# Patient Record
Sex: Female | Born: 1960 | Race: White | Hispanic: No | Marital: Married | Smoking: Current every day smoker
Health system: Southern US, Community
[De-identification: ages and names within clinical notes are randomized; demographics above are authoritative.]

## PROBLEM LIST (undated history)

## (undated) DIAGNOSIS — F4310 Post-traumatic stress disorder, unspecified: Secondary | ICD-10-CM

## (undated) DIAGNOSIS — I10 Essential (primary) hypertension: Secondary | ICD-10-CM

## (undated) DIAGNOSIS — J449 Chronic obstructive pulmonary disease, unspecified: Secondary | ICD-10-CM

---

## 1984-04-11 HISTORY — PX: TOTAL ABDOMINAL HYSTERECTOMY: SHX209

## 1984-04-11 HISTORY — PX: ABDOMINAL HYSTERECTOMY: SHX81

## 2008-06-09 ENCOUNTER — Emergency Department: Payer: Self-pay | Admitting: Emergency Medicine

## 2010-03-07 ENCOUNTER — Inpatient Hospital Stay: Payer: Self-pay | Admitting: Internal Medicine

## 2010-08-05 ENCOUNTER — Ambulatory Visit: Payer: Self-pay | Admitting: Family Medicine

## 2010-08-31 ENCOUNTER — Ambulatory Visit: Payer: Self-pay | Admitting: Internal Medicine

## 2011-10-15 DIAGNOSIS — A419 Sepsis, unspecified organism: Secondary | ICD-10-CM | POA: Insufficient documentation

## 2011-10-15 DIAGNOSIS — R197 Diarrhea, unspecified: Secondary | ICD-10-CM | POA: Insufficient documentation

## 2011-11-02 DIAGNOSIS — R109 Unspecified abdominal pain: Secondary | ICD-10-CM | POA: Insufficient documentation

## 2012-01-20 ENCOUNTER — Ambulatory Visit: Payer: Self-pay | Admitting: Internal Medicine

## 2013-07-18 LAB — TSH: TSH: 1.78 u[IU]/mL (ref <=5.90)

## 2013-07-18 LAB — BASIC METABOLIC PANEL
BUN: 8 mg/dL (ref 4–21)
CREATININE: 0.6 mg/dL (ref <=1.1)

## 2013-07-18 LAB — LIPID PANEL
Cholesterol: 184 mg/dL (ref 0–200)
HDL: 117 mg/dL — AB (ref 35–70)
LDL Cholesterol: 58 mg/dL
Triglycerides: 46 mg/dL (ref 40–160)

## 2013-11-11 ENCOUNTER — Ambulatory Visit: Payer: Self-pay | Admitting: Physician Assistant

## 2014-07-18 ENCOUNTER — Ambulatory Visit
Admit: 2014-07-18 | Disposition: A | Discharge status: Home / Self Care | Payer: Self-pay | Attending: Internal Medicine | Admitting: Internal Medicine

## 2014-09-15 ENCOUNTER — Other Ambulatory Visit: Payer: Self-pay | Admitting: Internal Medicine

## 2014-09-15 ENCOUNTER — Telehealth: Payer: Self-pay

## 2014-09-15 DIAGNOSIS — F419 Anxiety disorder, unspecified: Secondary | ICD-10-CM | POA: Insufficient documentation

## 2014-09-15 DIAGNOSIS — M19079 Primary osteoarthritis, unspecified ankle and foot: Secondary | ICD-10-CM | POA: Insufficient documentation

## 2014-09-15 DIAGNOSIS — F1020 Alcohol dependence, uncomplicated: Secondary | ICD-10-CM | POA: Insufficient documentation

## 2014-09-15 DIAGNOSIS — I340 Nonrheumatic mitral (valve) insufficiency: Secondary | ICD-10-CM | POA: Insufficient documentation

## 2014-09-15 DIAGNOSIS — R05 Cough: Secondary | ICD-10-CM | POA: Insufficient documentation

## 2014-09-15 DIAGNOSIS — F32A Depression, unspecified: Secondary | ICD-10-CM | POA: Insufficient documentation

## 2014-09-15 DIAGNOSIS — N951 Menopausal and female climacteric states: Secondary | ICD-10-CM | POA: Insufficient documentation

## 2014-09-15 DIAGNOSIS — R053 Chronic cough: Secondary | ICD-10-CM | POA: Insufficient documentation

## 2014-09-15 DIAGNOSIS — J432 Centrilobular emphysema: Secondary | ICD-10-CM | POA: Insufficient documentation

## 2014-09-15 DIAGNOSIS — F17200 Nicotine dependence, unspecified, uncomplicated: Secondary | ICD-10-CM | POA: Insufficient documentation

## 2014-09-15 DIAGNOSIS — I10 Essential (primary) hypertension: Secondary | ICD-10-CM | POA: Insufficient documentation

## 2014-09-15 DIAGNOSIS — R636 Underweight: Secondary | ICD-10-CM | POA: Insufficient documentation

## 2014-09-15 HISTORY — DX: Primary osteoarthritis, unspecified ankle and foot: M19.079

## 2014-09-15 MED ORDER — FLUTICASONE PROPIONATE 50 MCG/ACT NA SUSP: 2.0000 | Freq: Every day | NASAL | Status: DC

## 2014-09-15 MED ORDER — DILTIAZEM HCL ER COATED BEADS 240 MG PO TB24: 240.0000 mg | ORAL_TABLET | Freq: Every day | ORAL | Status: DC

## 2014-09-15 NOTE — Telephone Encounter (Signed)
Patient needs refill on Cardizem and Flonase

## 2014-09-15 NOTE — Telephone Encounter (Signed)
Refill sent to pharmacy as requested

## 2014-10-02 ENCOUNTER — Ambulatory Visit: Payer: Medicaid Other

## 2014-10-02 ENCOUNTER — Ambulatory Visit
Admission: EM | Admit: 2014-10-02 | Discharge: 2014-10-02 | Disposition: A | Discharge status: Home / Self Care | Payer: Medicaid Other | Attending: Internal Medicine | Admitting: Internal Medicine

## 2014-10-02 DIAGNOSIS — S8002XA Contusion of left knee, initial encounter: Secondary | ICD-10-CM | POA: Diagnosis not present

## 2014-10-02 DIAGNOSIS — Z79899 Other long term (current) drug therapy: Secondary | ICD-10-CM | POA: Diagnosis not present

## 2014-10-02 DIAGNOSIS — M25562 Pain in left knee: Secondary | ICD-10-CM | POA: Diagnosis present

## 2014-10-02 DIAGNOSIS — F4310 Post-traumatic stress disorder, unspecified: Secondary | ICD-10-CM | POA: Diagnosis not present

## 2014-10-02 DIAGNOSIS — F17210 Nicotine dependence, cigarettes, uncomplicated: Secondary | ICD-10-CM | POA: Diagnosis not present

## 2014-10-02 DIAGNOSIS — W19XXXA Unspecified fall, initial encounter: Secondary | ICD-10-CM | POA: Diagnosis not present

## 2014-10-02 DIAGNOSIS — I10 Essential (primary) hypertension: Secondary | ICD-10-CM | POA: Insufficient documentation

## 2014-10-02 DIAGNOSIS — J449 Chronic obstructive pulmonary disease, unspecified: Secondary | ICD-10-CM | POA: Insufficient documentation

## 2014-10-02 HISTORY — DX: Essential (primary) hypertension: I10

## 2014-10-02 HISTORY — DX: Chronic obstructive pulmonary disease, unspecified: J44.9

## 2014-10-02 HISTORY — DX: Post-traumatic stress disorder, unspecified: F43.10

## 2014-10-02 MED ORDER — KETOROLAC TROMETHAMINE 60 MG/2ML IM SOLN
60.0000 mg | Freq: Once | INTRAMUSCULAR | Status: AC
  Administered 2014-10-02: 60 mg via INTRAMUSCULAR

## 2014-10-02 MED ORDER — HYDROCODONE-ACETAMINOPHEN 5-325 MG PO TABS: 1.0000 | ORAL_TABLET | ORAL | Status: DC | PRN

## 2014-10-02 NOTE — ED Notes (Signed)
States tripped last night and fell landing on left knee on floor. Denies LOC. States left leg/knee painful to bear weight

## 2014-10-02 NOTE — Discharge Instructions (Signed)
Xrays at the urgent care today did not show evidence of fracture. Ice for 5-10 minutes several times over the next couple days will help decrease swelling and pain. Prescription for pain medicine is attached.  Aleve or advil may also be helpful with pain. Anticipate slow improvement in pain/swelling over the next 2-3 weeks.  Contusion A contusion is a deep bruise. Contusions are the result of an injury that caused bleeding under the skin. The contusion may turn blue, purple, or yellow. Minor injuries will give you a painless contusion, but more severe contusions may stay painful and swollen for a few weeks.  CAUSES  A contusion is usually caused by a blow, trauma, or direct force to an area of the body. SYMPTOMS   Swelling and redness of the injured area.  Bruising of the injured area.  Tenderness and soreness of the injured area.  Pain. DIAGNOSIS  The diagnosis can be made by taking a history and physical exam. An X-ray, CT scan, or MRI may be needed to determine if there were any associated injuries, such as fractures. TREATMENT  Specific treatment will depend on what area of the body was injured. In general, the best treatment for a contusion is resting, icing, elevating, and applying cold compresses to the injured area. Over-the-counter medicines may also be recommended for pain control. Ask your caregiver what the best treatment is for your contusion. HOME CARE INSTRUCTIONS   Put ice on the injured area.  Put ice in a plastic bag.  Place a towel between your skin and the bag.  Leave the ice on for 15-20 minutes, 3-4 times a day, or as directed by your health care provider.  Only take over-the-counter or prescription medicines for pain, discomfort, or fever as directed by your caregiver. Your caregiver may recommend avoiding anti-inflammatory medicines (aspirin, ibuprofen, and naproxen) for 48 hours because these medicines may increase bruising.  Rest the injured area.  If  possible, elevate the injured area to reduce swelling. SEEK IMMEDIATE MEDICAL CARE IF:   You have increased bruising or swelling.  You have pain that is getting worse.  Your swelling or pain is not relieved with medicines. MAKE SURE YOU:   Understand these instructions.  Will watch your condition.  Will get help right away if you are not doing well or get worse. Document Released: 01/05/2005 Document Revised: 04/02/2013 Document Reviewed: 01/31/2011 [facility] Patient Information 2015 [place], [place]. This information is not intended to replace advice given to you by your health care provider. Make sure you discuss any questions you have with your health care provider.

## 2014-10-02 NOTE — ED Provider Notes (Signed)
CSN: [id number]     Arrival date & time 10/02/14  1457 History   First MD Initiated Contact with Patient 10/02/14 1529     Chief Complaint  Patient presents with  . Knee Pain   HPI   Patient is a 54 year old lady who reports that she fell last night, fell forward, landing on the left knee, not real clear on details regarding the fall but just got a new kitten and admits to 6 or 7 alcoholic beverages daily. She was able to walk into the urgent care independently today, having a lot of discomfort in the medial left knee. She has some minor contusions/abrasions around the left elbow, but moving the left elbow freely. No other injuries reported. Has extensive history of lung disease, COPD, and prednisone therapy.  Past Medical History  Diagnosis Date  . COPD (chronic obstructive pulmonary disease)   . Hypertension   . PTSD (post-traumatic stress disorder)    Past Surgical History  Procedure Laterality Date  . Abdominal hysterectomy  1986   Family History  Problem Relation Age of Onset  . Cancer Mother     breast   History  Substance Use Topics  . Smoking status: Current Every Day Smoker -- 1.00 packs/day    Types: Cigarettes  . Smokeless tobacco: Not on file  . Alcohol Use: 0.0 oz/week    0 Standard drinks or equivalent per week     Comment: 6-7 each day   OB History    No data available     Review of Systems  All other systems reviewed and are negative.   Allergies  Azithromycin  Home Medications   Prior to Admission medications   Medication Sig Start Date End Date Taking? Authorizing Provider  albuterol (PROVENTIL HFA;VENTOLIN HFA) 108 (90 BASE) MCG/ACT inhaler Inhale 2 Inhalers into the lungs 4 (four) times daily as needed. 05/13/14  Yes Historical Provider, MD  ALPRAZolam Duanne Moron) 0.5 MG tablet Take 1 tablet by mouth daily.   Yes Historical Provider, MD  benazepril (LOTENSIN) 5 MG tablet Take 1 tablet by mouth daily. 05/13/14  Yes Historical Provider, MD  CARDIZEM LA  240 MG 24 hr tablet TAKE 1 TABLET BY MOUTH EVERY DAY 09/15/14  Yes Glean Hess, MD  fluticasone [facility]) 50 MCG/ACT nasal spray SPRAY TWICE IN EACH NOSTRIL EVERY DAY 09/15/14   Glean Hess, MD  Fluticasone-Salmeterol (ADVAIR) 100-50 MCG/DOSE AEPB Inhale 1 Inhaler into the lungs 2 (two) times daily. 04/15/13   Historical Provider, MD   BP 118/67 mmHg  Pulse 102  Temp(Src) 98.2 F (36.8 C) (Tympanic)  Resp 18  Ht '5\' 4"'$  (1.626 m)  Wt 85 lb (38.556 kg)  BMI 14.58 kg/m2  SpO2 99% Physical Exam  Constitutional: She is oriented to person, place, and time. No distress.  Alert, nicely groomed Smell strongly of smoke. Patient is noted to be markedly thin.  HENT:  Head: Atraumatic.  Eyes:  Conjugate gaze, no eye redness/drainage  Neck: Neck supple.  Cardiovascular: Normal rate.   Pulmonary/Chest: No respiratory distress.  Abdominal: She exhibits no distension.  Musculoskeletal: Normal range of motion.  No leg swelling Probable moderate left knee effusion, with limitation of knee flexion to about 90 because of pain. There is focal bony tenderness at the medial aspect of the knee, inferiorly, not much in the way of bruising.  Neurological: She is alert and oriented to person, place, and time.  Skin: Skin is warm and dry.  No cyanosis Skin is tanned  Nursing note and vitals reviewed.   ED Course  Procedures   Toradol injection 60 mg given at the urgent care for pain.  Imaging Review Dg Knee Ap/lat W/sunrise Left  10/02/2014   CLINICAL DATA:  Status post fall 10/01/2014. Left knee pain. Initial encounter.  EXAM: LEFT KNEE 3 VIEWS  COMPARISON:  None.  FINDINGS: There is no evidence of fracture, dislocation, or joint effusion. There is no evidence of arthropathy or other focal bone abnormality. Soft tissues are unremarkable.  IMPRESSION: Negative exam.   Electronically Signed   By: Inge Rise M.D.   On: 10/02/2014 16:11     MDM   1. Contusion, knee, left, initial encounter     Ice for 5 or 10 minutes several times over the next several days they be helpful in reducing pain and swelling. Advil or Aleve over-the-counter may also help with pain. A prescription for #6 of Vicodin tablets was given to help with pain in the very short-term. Recheck or follow up with PCP/Dr. Army Melia in a week or so if not slowly improving. Anticipate gradual improvement over the next 2 or 3 weeks.    Sherlene Shams, MD 10/02/14 [id number]

## 2014-11-10 ENCOUNTER — Other Ambulatory Visit: Payer: Self-pay | Admitting: Internal Medicine

## 2014-12-11 ENCOUNTER — Other Ambulatory Visit: Payer: Self-pay | Admitting: Internal Medicine

## 2014-12-13 ENCOUNTER — Encounter: Payer: Self-pay | Admitting: Gynecology

## 2014-12-13 ENCOUNTER — Ambulatory Visit
Admission: EM | Admit: 2014-12-13 | Discharge: 2014-12-13 | Disposition: A | Discharge status: Home / Self Care | Payer: Medicaid Other | Attending: Internal Medicine | Admitting: Internal Medicine

## 2014-12-13 DIAGNOSIS — J441 Chronic obstructive pulmonary disease with (acute) exacerbation: Secondary | ICD-10-CM

## 2014-12-13 MED ORDER — IPRATROPIUM-ALBUTEROL 0.5-2.5 (3) MG/3ML IN SOLN
3.0000 mL | Freq: Once | RESPIRATORY_TRACT | Status: AC
Start: 2014-12-13 — End: 2014-12-13
  Administered 2014-12-13: 3 mL via RESPIRATORY_TRACT

## 2014-12-13 MED ORDER — BENZONATATE 200 MG PO CAPS: 200.0000 mg | ORAL_CAPSULE | Freq: Three times a day (TID) | ORAL | Status: DC | PRN

## 2014-12-13 MED ORDER — IPRATROPIUM BROMIDE HFA 17 MCG/ACT IN AERS: 2.0000 | INHALATION_SPRAY | RESPIRATORY_TRACT | Status: DC | PRN

## 2014-12-13 MED ORDER — PREDNISONE 50 MG PO TABS: 50.0000 mg | ORAL_TABLET | Freq: Every day | ORAL | Status: DC

## 2014-12-13 NOTE — ED Notes (Signed)
Patient states sob, cough, congestion, diarrhea times 3 days

## 2014-12-13 NOTE — Discharge Instructions (Signed)
Prescriptions for prednisone, tessalon (benzonatate, a cough preparation), and atrovent (ipratropium inhaler, like breathing treatment given at urgent care today) were sent to the pharmacy. Recheck for worsening cough, phlegm production, fever, increasing breathlessness. Anticipate gradual improvement in the cough over the next 2-3 weeks.

## 2014-12-13 NOTE — ED Provider Notes (Signed)
CSN: [id number]     Arrival date & time 12/13/14  1357 History   First MD Initiated Contact with Patient 12/13/14 1442     Chief Complaint  Patient presents with  . Nasal Congestion  . Shortness of Breath  . Cough  . Diarrhea   HPI  Patient is a 54 year old lady with COPD who presents with a 2 day history of increasing cough, phlegm production, dyspnea. Tactile temperature. Not resting at night because of coughing. Continues to smoke. Little bit of diarrhea, no vomiting.  Past Medical History  Diagnosis Date  . COPD (chronic obstructive pulmonary disease)   . Hypertension   . PTSD (post-traumatic stress disorder)    Past Surgical History  Procedure Laterality Date  . Abdominal hysterectomy  1986   Family History  Problem Relation Age of Onset  . Cancer Mother     breast   Social History  Substance Use Topics  . Smoking status: Current Every Day Smoker -- 1.00 packs/day    Types: Cigarettes  . Smokeless tobacco: Never Used  . Alcohol Use: 0.0 oz/week    0 Standard drinks or equivalent per week     Comment: 6-7 each day    Review of Systems  All other systems reviewed and are negative.   Allergies  Azithromycin  Home Medications   Prior to Admission medications   Medication Sig Start Date End Date Taking? Authorizing Provider  ALPRAZolam Duanne Moron) 0.5 MG tablet Take 1 tablet by mouth daily.   Yes Historical Provider, MD  benazepril (LOTENSIN) 5 MG tablet TAKE 1 TABLET BY MOUTH EVERY DAY 12/11/14  Yes Glean Hess, MD  CARDIZEM LA 240 MG 24 hr tablet TAKE 1 TABLET BY MOUTH EVERY DAY 09/15/14  Yes Glean Hess, MD  fluticasone [facility]) 50 MCG/ACT nasal spray SPRAY TWICE IN EACH NOSTRIL EVERY DAY 09/15/14  Yes Glean Hess, MD  Fluticasone-Salmeterol (ADVAIR) 100-50 MCG/DOSE AEPB Inhale 1 Inhaler into the lungs 2 (two) times daily. 04/15/13  Yes Historical Provider, MD  PROVENTIL HFA 108 (90 BASE) MCG/ACT inhaler INHALE 2 PUFFS BY MOUTH FOUR TIMES DAILY 11/11/14  Yes  Glean Hess, MD                        Meds Ordered and Administered this Visit   Medications  ipratropium-albuterol (DUONEB) 0.5-2.5 (3) MG/3ML nebulizer solution 3 mL (3 mLs Nebulization Given 12/13/14 1524)   mild improvement in respiratory effort, and in presence of breath sounds posteriorly, after DuoNeb treatment.  BP 129/72 mmHg  Pulse 115  Temp(Src) 98.2 F (36.8 C) (Oral)  Ht '5\' 5"'$  (1.651 m)  Wt 82 lb (37.195 kg)  BMI 13.65 kg/m2  SpO2 99%   Physical Exam  Constitutional: She is oriented to person, place, and time. No distress.  Alert, nicely groomed  HENT:  Head: Atraumatic.  Eyes:  Conjugate gaze, no eye redness/drainage  Neck: Neck supple.  Cardiovascular: Regular rhythm.   Slightly tachycardic, heart rate 110s  Pulmonary/Chest: She has no wheezes. She has no rales.  Lungs clear, symmetric breath sounds Breath sounds markedly diminished posteriorly, and patient is splinting slightly Frequent coughing during exam  Abdominal: She exhibits no distension.  Musculoskeletal: Normal range of motion.  No leg swelling  Neurological: She is alert and oriented to person, place, and time.  Skin: Skin is warm and dry.  No cyanosis  Nursing note and vitals reviewed.   ED Course  Procedures  None  MDM   1. COPD exacerbation    Discharge Medication List as of 12/13/2014  4:02 PM    START taking these medications   Details  benzonatate (TESSALON) 200 MG capsule Take 1 capsule (200 mg total) by mouth 3 (three) times daily as needed for cough., Starting 12/13/2014, Until Discontinued, Normal    ipratropium (ATROVENT HFA) 17 MCG/ACT inhaler Inhale 2 puffs into the lungs every 4 (four) hours as needed for wheezing., Starting 12/13/2014, Until Discontinued, Normal    predniSONE (DELTASONE) 50 MG tablet Take 1 tablet (50 mg total) by mouth daily., Starting 12/13/2014, Until Discontinued, Normal       If cough/phlegm production not starting to improve in a couple of  days, might consider adding an antibiotic. Anticipate gradual return to baseline for cough/dyspnea over the next 2-3 weeks.    Sherlene Shams, MD 12/13/14 1946

## 2014-12-19 ENCOUNTER — Other Ambulatory Visit: Payer: Self-pay | Admitting: Internal Medicine

## 2014-12-19 ENCOUNTER — Ambulatory Visit: Payer: Self-pay | Admitting: Internal Medicine

## 2015-01-15 ENCOUNTER — Other Ambulatory Visit: Payer: Self-pay

## 2015-01-15 MED ORDER — DILTIAZEM HCL ER COATED BEADS 240 MG PO TB24: 240.0000 mg | ORAL_TABLET | Freq: Every day | ORAL | Status: DC

## 2015-01-20 ENCOUNTER — Other Ambulatory Visit: Payer: Self-pay

## 2015-01-20 MED ORDER — FLUTICASONE-SALMETEROL 100-50 MCG/DOSE IN AEPB: 1.0000 | INHALATION_SPRAY | Freq: Two times a day (BID) | RESPIRATORY_TRACT | Status: DC

## 2015-02-03 ENCOUNTER — Other Ambulatory Visit: Payer: Self-pay

## 2015-02-03 ENCOUNTER — Emergency Department: Payer: Medicaid Other

## 2015-02-03 ENCOUNTER — Ambulatory Visit: Payer: Self-pay | Admitting: Internal Medicine

## 2015-02-03 ENCOUNTER — Encounter: Payer: Self-pay | Admitting: Emergency Medicine

## 2015-02-03 ENCOUNTER — Emergency Department
Admission: EM | Admit: 2015-02-03 | Discharge: 2015-02-03 | Disposition: A | Discharge status: Home / Self Care | Payer: Medicaid Other | Attending: Emergency Medicine | Admitting: Emergency Medicine

## 2015-02-03 DIAGNOSIS — Z7951 Long term (current) use of inhaled steroids: Secondary | ICD-10-CM | POA: Diagnosis not present

## 2015-02-03 DIAGNOSIS — I4892 Unspecified atrial flutter: Secondary | ICD-10-CM | POA: Diagnosis not present

## 2015-02-03 DIAGNOSIS — I10 Essential (primary) hypertension: Secondary | ICD-10-CM | POA: Insufficient documentation

## 2015-02-03 DIAGNOSIS — Z79899 Other long term (current) drug therapy: Secondary | ICD-10-CM | POA: Insufficient documentation

## 2015-02-03 DIAGNOSIS — IMO0001 Reserved for inherently not codable concepts without codable children: Secondary | ICD-10-CM

## 2015-02-03 DIAGNOSIS — J159 Unspecified bacterial pneumonia: Secondary | ICD-10-CM | POA: Insufficient documentation

## 2015-02-03 DIAGNOSIS — Z720 Tobacco use: Secondary | ICD-10-CM | POA: Insufficient documentation

## 2015-02-03 DIAGNOSIS — R05 Cough: Secondary | ICD-10-CM | POA: Diagnosis present

## 2015-02-03 DIAGNOSIS — J441 Chronic obstructive pulmonary disease with (acute) exacerbation: Secondary | ICD-10-CM | POA: Diagnosis not present

## 2015-02-03 LAB — CBC WITH DIFFERENTIAL/PLATELET
Basophils Absolute: 0.1 10*3/uL (ref 0–0.1)
Basophils Relative: 1 %
Eosinophils Absolute: 0.1 10*3/uL (ref 0–0.7)
Eosinophils Relative: 1 %
HCT: 45.3 % (ref 35.0–47.0)
Hemoglobin: 15.1 g/dL (ref 12.0–16.0)
Lymphocytes Relative: 16 %
Lymphs Abs: 2 10*3/uL (ref 1.0–3.6)
MCH: 31.7 pg (ref 26.0–34.0)
MCHC: 33.3 g/dL (ref 32.0–36.0)
MCV: 95.2 fL (ref 80.0–100.0)
Monocytes Absolute: 1.3 10*3/uL — ABNORMAL HIGH (ref 0.2–0.9)
Monocytes Relative: 10 %
Neutro Abs: 9.4 10*3/uL — ABNORMAL HIGH (ref 1.4–6.5)
Neutrophils Relative %: 72 %
Platelets: 385 10*3/uL (ref 150–440)
RBC: 4.76 MIL/uL (ref 3.80–5.20)
RDW: 12.1 % (ref 11.5–14.5)
WBC: 13 10*3/uL — ABNORMAL HIGH (ref 3.6–11.0)

## 2015-02-03 LAB — COMPREHENSIVE METABOLIC PANEL WITH GFR
ALT: 18 U/L (ref 14–54)
AST: 20 U/L (ref 15–41)
Albumin: 4.6 g/dL (ref 3.5–5.0)
Alkaline Phosphatase: 134 U/L — ABNORMAL HIGH (ref 38–126)
Anion gap: 12 (ref 5–15)
BUN: 8 mg/dL (ref 6–20)
CO2: 24 mmol/L (ref 22–32)
Calcium: 9.8 mg/dL (ref 8.9–10.3)
Chloride: 101 mmol/L (ref 101–111)
Creatinine, Ser: 0.5 mg/dL (ref 0.44–1.00)
GFR calc Af Amer: >60 mL/min
GFR calc non Af Amer: >60 mL/min
Glucose, Bld: 113 mg/dL — ABNORMAL HIGH (ref 65–99)
Potassium: 4.1 mmol/L (ref 3.5–5.1)
Sodium: 137 mmol/L (ref 135–145)
Total Bilirubin: 0.4 mg/dL (ref 0.3–1.2)
Total Protein: 8.9 g/dL — ABNORMAL HIGH (ref 6.5–8.1)

## 2015-02-03 LAB — TSH: TSH: 1.038 u[IU]/mL (ref 0.350–4.500)

## 2015-02-03 LAB — INFLUENZA PANEL BY PCR (TYPE A & B)
H1N1 flu by pcr: NOT DETECTED
Influenza A By PCR: NEGATIVE
Influenza B By PCR: NEGATIVE

## 2015-02-03 LAB — FIBRIN DERIVATIVES D-DIMER (ARMC ONLY): Fibrin derivatives D-dimer (ARMC): 454 (ref 0–499)

## 2015-02-03 LAB — TROPONIN I: Troponin I: <0.03 ng/mL (ref <0.031)

## 2015-02-03 LAB — BRAIN NATRIURETIC PEPTIDE: B Natriuretic Peptide: 126 pg/mL — ABNORMAL HIGH (ref 0.0–100.0)

## 2015-02-03 MED ORDER — METHYLPREDNISOLONE SODIUM SUCC 125 MG IJ SOLR
125.0000 mg | Freq: Once | INTRAMUSCULAR | Status: AC
  Administered 2015-02-03: 125 mg via INTRAVENOUS
  Filled 2015-02-03: qty 2

## 2015-02-03 MED ORDER — CEFTRIAXONE SODIUM 1 G IJ SOLR
1.0000 g | Freq: Once | INTRAMUSCULAR | Status: AC
  Administered 2015-02-03: 1 g via INTRAVENOUS
  Filled 2015-02-03: qty 10

## 2015-02-03 MED ORDER — LORAZEPAM 2 MG/ML IJ SOLN
1.0000 mg | Freq: Once | INTRAMUSCULAR | Status: AC
  Administered 2015-02-03: 1 mg via INTRAVENOUS
  Filled 2015-02-03: qty 1

## 2015-02-03 MED ORDER — DILTIAZEM HCL 25 MG/5ML IV SOLN
INTRAVENOUS | Status: AC
  Filled 2015-02-03: qty 5

## 2015-02-03 MED ORDER — DILTIAZEM HCL 25 MG/5ML IV SOLN
10.0000 mg | Freq: Once | INTRAVENOUS | Status: AC
  Administered 2015-02-03: 10 mg via INTRAVENOUS

## 2015-02-03 MED ORDER — IPRATROPIUM-ALBUTEROL 0.5-2.5 (3) MG/3ML IN SOLN
3.0000 mL | Freq: Once | RESPIRATORY_TRACT | Status: AC
  Administered 2015-02-03: 3 mL via RESPIRATORY_TRACT
  Filled 2015-02-03: qty 3

## 2015-02-03 MED ORDER — IOHEXOL 350 MG/ML SOLN
75.0000 mL | Freq: Once | INTRAVENOUS | Status: AC | PRN
  Administered 2015-02-03: 75 mL via INTRAVENOUS

## 2015-02-03 MED ORDER — LEVOFLOXACIN 500 MG PO TABS: 500.0000 mg | ORAL_TABLET | Freq: Every day | ORAL | Status: DC

## 2015-02-03 MED ORDER — PREDNISONE 20 MG PO TABS: 40.0000 mg | ORAL_TABLET | Freq: Every day | ORAL | Status: DC

## 2015-02-03 MED ORDER — SODIUM CHLORIDE 0.9 % IV BOLUS (SEPSIS)
1000.0000 mL | Freq: Once | INTRAVENOUS | Status: AC
  Administered 2015-02-03: 1000 mL via INTRAVENOUS

## 2015-02-03 NOTE — ED Notes (Signed)
Patient transported to CT via stretcher.

## 2015-02-03 NOTE — ED Provider Notes (Signed)
Time Seen: Approximately. ----------------------------------------- 4:50 PM on 02/03/2015 -----------------------------------------  I have reviewed the triage notes  Chief Complaint: Cough and Nasal Congestion   History of Present Illness: Kendra Mullins Ward is a 54 y.o. female who presents overall with a one-month history of feelings of generalized weakness and fatigue which seemed to wax and wane in intensity. She states since Thursday she's had increased chest congestion and low-grade fever productive cough that she's not sure of the tint of the sputum because she states he generally swallows it. She does have some occasional nausea with coughing but no persistent vomiting. He states that she thinks she may have not taken her normal blood pressure medication and her Cardizem this morning. She has a long history of COPD, hypertension and posttraumatic stress disorder. She states that she's had mainly a low-grade fever over the weekend at 100.4. He denies any calf tenderness or swelling. She was recently started on some prednisone by her primary physician which has not offered any relief. She states last time she used her inhaler once this morning.   Past Medical History  Diagnosis Date  . COPD (chronic obstructive pulmonary disease) ([place])   . Hypertension   . PTSD (post-traumatic stress disorder)     Patient Active Problem List   Diagnosis Date Noted  . Continuous chronic alcoholism ([place]) 09/15/2014  . Ankle inflammation 09/15/2014  . Anxiety 09/15/2014  . Cough, persistent 09/15/2014  . Smokes tobacco daily 09/15/2014  . Essential (primary) hypertension 09/15/2014  . Arthralgia of hand 09/15/2014  . Hot flash, menopausal 09/15/2014  . MI (mitral incompetence) 09/15/2014  . Chronic obstructive pulmonary emphysema ([place]) 09/15/2014  . Low weight 09/15/2014    Past Surgical History  Procedure Laterality Date  . Abdominal hysterectomy  1986    Past Surgical History   Procedure Laterality Date  . Abdominal hysterectomy  1986    Current Outpatient Rx  Name  Route  Sig  Dispense  Refill  . ALPRAZolam (XANAX) 0.5 MG tablet   Oral   Take 1 tablet by mouth daily.         . benazepril (LOTENSIN) 5 MG tablet      TAKE 1 TABLET BY MOUTH EVERY DAY   30 tablet   12   . benzonatate (TESSALON) 200 MG capsule   Oral   Take 1 capsule (200 mg total) by mouth 3 (three) times daily as needed for cough.   30 capsule   1   . diltiazem (CARDIZEM LA) 240 MG 24 hr tablet   Oral   Take 1 tablet (240 mg total) by mouth daily.   30 tablet   5   . escitalopram (LEXAPRO) 5 MG tablet   Oral   Take 1 tablet by mouth daily.      1   . fluticasone (FLONASE) 50 MCG/ACT nasal spray      SPRAY TWICE IN EACH NOSTRIL EVERY DAY   16 g   5   . Fluticasone-Salmeterol (ADVAIR) 100-50 MCG/DOSE AEPB   Inhalation   Inhale 1 puff into the lungs 2 (two) times daily.   60 each   12   . ipratropium (ATROVENT HFA) 17 MCG/ACT inhaler   Inhalation   Inhale 2 puffs into the lungs every 4 (four) hours as needed for wheezing.   1 Inhaler   12   . predniSONE (DELTASONE) 50 MG tablet   Oral   Take 1 tablet (50 mg total) by mouth daily.   5  tablet   0   . PROVENTIL HFA 108 (90 BASE) MCG/ACT inhaler      INHALE 2 PUFFS BY MOUTH FOUR TIMES DAILY   6.7 g   5     Allergies:  Azithromycin  Family History: Family History  Problem Relation Age of Onset  . Cancer Mother     breast    Social History: Social History  Substance Use Topics  . Smoking status: Current Every Day Smoker -- 1.00 packs/day    Types: Cigarettes  . Smokeless tobacco: Never Used  . Alcohol Use: 0.0 oz/week    0 Standard drinks or equivalent per week     Comment: 6-7 each day     Review of Systems:   10 point review of systems was performed and was otherwise negative:  Constitutional: No fever Eyes: No visual disturbances ENT: No sore throat, ear pain Cardiac: No chest  pain Respiratory: No shortness of breath, wheezing, or stridor Abdomen: No abdominal pain, no vomiting, No diarrhea Endocrine: No weight loss, No night sweats Extremities: No peripheral edema, cyanosis Skin: No rashes, easy bruising Neurologic: No focal weakness, trouble with speech or swollowing Urologic: No dysuria, Hematuria, or urinary frequency   Physical Exam:  ED Triage Vitals  Enc Vitals Group     BP 02/03/15 1624 161/118 mmHg     Pulse Rate 02/03/15 1624 124     Resp 02/03/15 1624 20     Temp 02/03/15 1624 98.6 F (37 C)     Temp Source 02/03/15 1624 Oral     SpO2 02/03/15 1624 97 %     Weight 02/03/15 1624 82 lb (37.195 kg)     Height 02/03/15 1624 '5\' 5"'$  (1.651 m)     Head Cir --      Peak Flow --      Pain Score 02/03/15 1626 0     Pain Loc --      Pain Edu? --      Excl. in [place]? --     General: Awake , Alert , and Oriented times 3; mildly cachectic in appearance. Glasgow Coma Scale 15. They anxious with forced speech. Head: Normal cephalic , atraumatic Eyes: Pupils equal , round, reactive to light Nose/Throat: No nasal drainage, patent upper airway without erythema or exudate.  Neck: Supple, Full range of motion, No anterior adenopathy or palpable thyroid masses Lungs: Limited air movement bilaterally at the bases without any obvious wheezes rhonchi's Rales Heart: Tachycardia without murmurs , gallops , or rubs Abdomen: Soft, non tender without rebound, guarding , or rigidity; bowel sounds positive and symmetric in all 4 quadrants. No organomegaly .        Extremities: 2 plus symmetric pulses. No edema, clubbing or cyanosis Neurologic: normal ambulation, Motor symmetric without deficits, sensory intact Skin: warm, dry, no rashes   Labs:   All laboratory work was reviewed including any pertinent negatives or positives listed below:  Labs Reviewed  CBC WITH DIFFERENTIAL/PLATELET  COMPREHENSIVE METABOLIC PANEL  TSH  INFLUENZA PANEL BY PCR (TYPE A & B, H1N1)   TROPONIN I  BRAIN NATRIURETIC PEPTIDE   reveal laboratory work shows slightly elevated white blood cell count  EKG:  ED ECG REPORT I, Daymon Larsen, the attending physician, personally viewed and interpreted this ECG.  Date: 02/03/2015 EKG Time: 1613 Rate: 136 Rhythm: Sinus tachycardia versus atrial flutter  QRS Axis: Left axis deviation Intervals: normal ST/T Wave abnormalities: Diffuse ST T wave abnormality Conduction Disutrbances: none Narrative Interpretation: unremarkable  No obvious acute ischemic change   Radiology: EXAM: CHEST 2 VIEW  COMPARISON: Chest radiograph 01/20/2012  FINDINGS: Monitoring leads overlie the patient. Stable cardiac and mediastinal contours. No consolidative pulmonary opacities. Stable biapical pleural parenchymal thickening. No pleural effusion or pneumothorax. The lungs are hyperexpanded. Mid thoracic spine degenerative changes. Bilateral nipple shadows.  IMPRESSION: Pulmonary hyperinflation. No acute cardiopulmonary process.     EXAM: CT ANGIOGRAPHY CHEST WITH CONTRAST  TECHNIQUE: Multidetector CT imaging of the chest was performed using the standard protocol during bolus administration of intravenous contrast. Multiplanar CT image reconstructions and MIPs were obtained to evaluate the vascular anatomy.  CONTRAST: 52m OMNIPAQUE IOHEXOL 350 MG/ML SOLN  COMPARISON: 03/07/2010  FINDINGS: Right arm IV contrast injection. SVC patent. Some reflux of contrast from the right atrium into the intrahepatic IVC. Satisfactory opacification of pulmonary arteries noted, and there is no evidence of pulmonary emboli. Mild left atrial enlargement. Adequate contrast opacification of the thoracic aorta with no evidence of dissection, aneurysm, or stenosis. There is classic 3-vessel brachiocephalic arch anatomy without proximal stenosis.  No pleural or pericardial effusion. No hilar or mediastinal adenopathy. Biapical pleural  parenchymal scarring, right greater than left, progressive since prior study. Pulmonary emphysematous changes most marked in the upper lobes. Patchy airspace opacities posteriorly in the left lower lobe images 78-83/6 probably infectious/inflammatory. Thoracic spine and sternum grossly intact. Visualized portions of upper abdomen unremarkable.  Review of the MIP images confirms the above findings.  IMPRESSION: 1. Negative for acute PE or thoracic aortic dissection. 2. Patchy nodular airspace opacities in the posterior left lower lobe, probably infectious/inflammatory such as early pneumonia. Consider three-month follow-up to confirm appropriate resolution.     I personally reviewed the radiologic studies     Critical Care: CRITICAL CARE Performed by: BDaymon Larsen  Total critical care time: 39 minutes   Critical care time was exclusive of separately billable procedures and treating other patients.  Critical care was necessary to treat or prevent imminent or life-threatening deterioration.  Critical care was time spent personally by me on the following activities: development of treatment plan with patient and/or surrogate as well as nursing, discussions with consultants, evaluation of patient's response to treatment, examination of patient, obtaining history from patient or surrogate, ordering and performing treatments and interventions, ordering and review of laboratory studies, ordering and review of radiographic studies, pulse oximetry and re-evaluation of patient's condition. Treatment of arrhythmia with IV antiarrhythmic medications along with workup for significant tachycardia   ED Course: Patient received IV Cardizem and appeared to correct to a sinus tachycardia. Workup was established for shortness of breath and mild chest discomfort along with tachycardia. Patient has required in the past to be intubated. The chest CT showed indications of patchy airspace disease  concerning for early pneumonia. Patient was given a bolus of IV antibiotics here in emergency department. She continued to stabilize and came down to a sinus tachycardia and I felt we could treat the patient on an outpatient basis. She most likely developed some atrial flutter from not taking her Cardizem this morning. She has Cardizem prescription of tablets at this time. She'll be treated on an outpatient basis with continued oral antibiotics.    Assessment:  Atrial flutter versus significant tachycardia History of COPD Early community acquired pneumonia      Plan:  Outpatient management Patient was advised to return immediately if condition worsens. Patient was advised to follow up with her primary care physician or other specialized physicians involved and in their  current assessment.             Daymon Larsen, MD 02/07/15 1400

## 2015-02-03 NOTE — ED Notes (Signed)
ED provider at bedside for reeval

## 2015-02-03 NOTE — ED Notes (Signed)
Assumed pt care at this time. NAD noted. RR even and nonlabored. Will continue to monitor.

## 2015-02-03 NOTE — ED Notes (Signed)
Patient is resting comfortably. 

## 2015-02-03 NOTE — ED Notes (Signed)
Apple juice and boxed lunch provided to patient.

## 2015-02-03 NOTE — ED Notes (Signed)
Pt to ed with c/o cough, congestion, sob x several days.  Pt states she has been seen by PMD for same and started on prednisone but states she is not feeling any better.

## 2015-02-03 NOTE — ED Notes (Signed)
ER Provider at bedside for re eval.

## 2015-02-13 ENCOUNTER — Ambulatory Visit (INDEPENDENT_AMBULATORY_CARE_PROVIDER_SITE_OTHER): Payer: Medicaid Other | Admitting: Internal Medicine

## 2015-02-13 ENCOUNTER — Encounter: Payer: Self-pay | Admitting: Internal Medicine

## 2015-02-13 ENCOUNTER — Other Ambulatory Visit: Payer: Self-pay | Admitting: Internal Medicine

## 2015-02-13 VITALS — BP 124/83 | HR 97 | Temp 98.9°F | Resp 16 | Ht 65.0 in | Wt 84.2 lb

## 2015-02-13 DIAGNOSIS — J189 Pneumonia, unspecified organism: Secondary | ICD-10-CM

## 2015-02-13 DIAGNOSIS — Z23 Encounter for immunization: Secondary | ICD-10-CM | POA: Diagnosis not present

## 2015-02-13 DIAGNOSIS — J431 Panlobular emphysema: Secondary | ICD-10-CM | POA: Diagnosis not present

## 2015-02-13 DIAGNOSIS — I10 Essential (primary) hypertension: Secondary | ICD-10-CM

## 2015-02-13 DIAGNOSIS — Z720 Tobacco use: Secondary | ICD-10-CM | POA: Diagnosis not present

## 2015-02-13 DIAGNOSIS — F17200 Nicotine dependence, unspecified, uncomplicated: Secondary | ICD-10-CM

## 2015-02-13 MED ORDER — BUPROPION HCL ER (XL) 150 MG PO TB24: 150.0000 mg | ORAL_TABLET | Freq: Every day | ORAL | Status: DC

## 2015-02-13 NOTE — Progress Notes (Signed)
Date:  02/13/2015   Name:  Kendra Mullins Ward   DOB:  03-24-1961   MRN:  [id number]   Chief Complaint: COPD Pneumonia She complains of cough and shortness of breath. The current episode started in the past 7 days. The problem occurs intermittently. The problem has been rapidly improving (She was seen at [facility] ER - diagnosed with LLL pneumonia.). Pertinent negatives include no chest pain, fever or postnasal drip. She reports significant improvement on treatment. Risk factors for lung disease include smoking/tobacco exposure. Her past medical history is significant for COPD.   She was seen in the emergency room. They also did a CT scan of the chest to rule out a pulmonary embolism. This showed left lower lobe pneumonia as well as changes of COPD in the upper lobes. On comparison to a CT from 2011 those changes have progressed. She continues to smoke cigarettes but has cut back somewhat. She's not quite committed to quitting but knows that she needs to. We discussed the need to quit to prevent further progression of her COPD. Wellbutrin would be an appropriate medication.   CLINICAL DATA: Pt to ed with c/o cough, congestion, sob x several days. Pt states she has been seen by PMD for same and started on prednisone but states she is not feeling any better.  EXAM: CT ANGIOGRAPHY CHEST WITH CONTRAST  TECHNIQUE: Multidetector CT imaging of the chest was performed using the standard protocol during bolus administration of intravenous contrast. Multiplanar CT image reconstructions and MIPs were obtained to evaluate the vascular anatomy.  CONTRAST: 11m OMNIPAQUE IOHEXOL 350 MG/ML SOLN  COMPARISON: 03/07/2010  FINDINGS: Right arm IV contrast injection. SVC patent. Some reflux of contrast from the right atrium into the intrahepatic IVC. Satisfactory opacification of pulmonary arteries noted, and there is no evidence of pulmonary emboli. Mild left atrial enlargement. Adequate  contrast opacification of the thoracic aorta with no evidence of dissection, aneurysm, or stenosis. There is classic 3-vessel brachiocephalic arch anatomy without proximal stenosis.  No pleural or pericardial effusion. No hilar or mediastinal adenopathy. Biapical pleural parenchymal scarring, right greater than left, progressive since prior study. Pulmonary emphysematous changes most marked in the upper lobes. Patchy airspace opacities posteriorly in the left lower lobe images 78-83/6 probably infectious/inflammatory. Thoracic spine and sternum grossly intact. Visualized portions of upper abdomen unremarkable.  Review of the MIP images confirms the above findings.  IMPRESSION: 1. Negative for acute PE or thoracic aortic dissection. 2. Patchy nodular airspace opacities in the posterior left lower lobe, probably infectious/inflammatory such as early pneumonia. Consider three-month follow-up to confirm appropriate resolution.   Electronically Signed  By: DLucrezia EuropeM.D.    Review of Systems  Constitutional: Negative for fever, fatigue and unexpected weight change (unable to gain weight).  HENT: Negative for postnasal drip and sinus pressure.   Respiratory: Positive for cough and shortness of breath. Negative for chest tightness.   Cardiovascular: Negative for chest pain, palpitations and leg swelling.  Gastrointestinal: Negative for abdominal pain.  Genitourinary: Negative for dysuria.  Musculoskeletal: Negative for back pain.  Skin: Negative for color change and rash.  Neurological: Negative for syncope and light-headedness.  Hematological: Negative for adenopathy.  Psychiatric/Behavioral: Negative for sleep disturbance.    Patient Active Problem List   Diagnosis Date Noted  . [place]ism [place] 09/15/2014  . Ankle inflammation 09/15/2014  . Anxiety 09/15/2014  . Cough, persistent 09/15/2014  . Smokes tobacco daily 09/15/2014  . Essential (primary)  hypertension 09/15/2014  . Arthralgia of  hand 09/15/2014  . Hot flash, menopausal 09/15/2014  . MI (mitral incompetence) 09/15/2014  . Chronic obstructive pulmonary emphysema ([place]) 09/15/2014  . Low weight 09/15/2014    Prior to Admission medications   Medication Sig Start Date End Date Taking? Authorizing Provider  ALPRAZolam Duanne Moron) 0.5 MG tablet Take 0.5 mg by mouth 2 (two) times daily as needed for anxiety.   Yes Historical Provider, MD  benazepril (LOTENSIN) 5 MG tablet Take 5 mg by mouth daily.   Yes Historical Provider, MD  diltiazem (CARDIZEM LA) 240 MG 24 hr tablet Take 1 tablet (240 mg total) by mouth daily. 01/15/15  Yes Glean Hess, MD  fluticasone (FLONASE) 50 MCG/ACT nasal spray Place 2 sprays into both nostrils daily as needed for rhinitis.   Yes Historical Provider, MD  Fluticasone-Salmeterol (ADVAIR) 100-50 MCG/DOSE AEPB Inhale 1 puff into the lungs 2 (two) times daily. 01/20/15  Yes Glean Hess, MD  ipratropium (ATROVENT HFA) 17 MCG/ACT inhaler Inhale 2 puffs into the lungs every 4 (four) hours as needed for wheezing. 12/13/14  Yes Sherlene Shams, MD  PROVENTIL HFA 108 [phone] BASE) MCG/ACT inhaler Inhale 2 puffs into the lungs 4 (four) times daily as needed. 01/14/15  Yes Historical Provider, MD    Allergies  Allergen Reactions  . Azithromycin Nausea Only    Past Surgical History  Procedure Laterality Date  . Abdominal hysterectomy  1986    Social History  Substance Use Topics  . Smoking status: Current Every Day Smoker -- 1.00 packs/day    Types: Cigarettes  . Smokeless tobacco: Never Used  . Alcohol Use: 0.0 oz/week    0 Standard drinks or equivalent per week     Comment: 6-7 each day    Medication list has been reviewed and updated.  Physical Exam  Constitutional: She is oriented to person, place, and time. She appears well-developed. She appears cachectic. No distress.  HENT:  Head: Normocephalic and atraumatic.  Eyes: Conjunctivae are normal.  Right eye exhibits no discharge. Left eye exhibits no discharge. No scleral icterus.  Neck: Normal range of motion. Neck supple. No thyromegaly present.  Cardiovascular: Normal rate, regular rhythm and normal heart sounds.   Pulmonary/Chest: Effort normal. No respiratory distress. She has no decreased breath sounds. She has no wheezes. She has no rhonchi.  Musculoskeletal: Normal range of motion.  Lymphadenopathy:    She has no cervical adenopathy.  Neurological: She is alert and oriented to person, place, and time.  Skin: Skin is warm and dry. No rash noted.  Psychiatric: She has a normal mood and affect. Her speech is normal and behavior is normal. Thought content normal.  Nursing note and vitals reviewed.   BP 124/83 mmHg  Pulse 97  Temp(Src) 98.9 F (37.2 C)  Resp 16  Ht '5\' 5"'$  (1.651 m)  Wt 84 lb 3.2 oz (38.193 kg)  BMI 14.01 kg/m2  SpO2 97%  Assessment and Plan: 1. CAP (community acquired pneumonia) Completed a full course of Levaquin and prednisone taper and doing well  2. Essential (primary) hypertension Controlled on medication  3. Panlobular emphysema (HCC) Continue inhalers as needed Discussed smoking cessation  4. Smokes tobacco daily Patient is given a prescription for bupropion to fill when she feels ready to quit smoking  - buPROPion (WELLBUTRIN XL) 150 MG 24 hr tablet; Take 1 tablet (150 mg total) by mouth daily.  Dispense: 30 tablet; Refill: 5  5. Need for influenza vaccination - Flu Vaccine QUAD 36+ mos IM  Halina Maidens, MD [facility]  02/13/2015

## 2015-02-16 ENCOUNTER — Other Ambulatory Visit: Payer: Self-pay | Admitting: Internal Medicine

## 2015-02-16 MED ORDER — CARDIZEM LA 240 MG PO TB24: 240.0000 mg | ORAL_TABLET | Freq: Every day | ORAL | Status: DC

## 2015-05-13 ENCOUNTER — Other Ambulatory Visit: Payer: Self-pay | Admitting: Internal Medicine

## 2015-05-15 ENCOUNTER — Encounter: Payer: Self-pay | Admitting: Internal Medicine

## 2015-05-15 ENCOUNTER — Ambulatory Visit
Admission: RE | Admit: 2015-05-15 | Discharge: 2015-05-15 | Disposition: A | Discharge status: Home / Self Care | Payer: Medicaid Other | Source: Ambulatory Visit | Attending: Internal Medicine | Admitting: Internal Medicine

## 2015-05-15 ENCOUNTER — Ambulatory Visit (INDEPENDENT_AMBULATORY_CARE_PROVIDER_SITE_OTHER): Payer: Medicaid Other | Admitting: Internal Medicine

## 2015-05-15 VITALS — BP 110/64 | HR 101 | Temp 98.0°F | Ht 65.0 in | Wt 85.0 lb

## 2015-05-15 DIAGNOSIS — Z20828 Contact with and (suspected) exposure to other viral communicable diseases: Secondary | ICD-10-CM | POA: Diagnosis not present

## 2015-05-15 DIAGNOSIS — J438 Other emphysema: Secondary | ICD-10-CM

## 2015-05-15 DIAGNOSIS — M62838 Other muscle spasm: Secondary | ICD-10-CM

## 2015-05-15 DIAGNOSIS — J449 Chronic obstructive pulmonary disease, unspecified: Secondary | ICD-10-CM | POA: Insufficient documentation

## 2015-05-15 DIAGNOSIS — I10 Essential (primary) hypertension: Secondary | ICD-10-CM | POA: Diagnosis not present

## 2015-05-15 DIAGNOSIS — M6248 Contracture of muscle, other site: Secondary | ICD-10-CM | POA: Diagnosis not present

## 2015-05-15 MED ORDER — PROVENTIL HFA 108 (90 BASE) MCG/ACT IN AERS: 2.0000 | INHALATION_SPRAY | Freq: Four times a day (QID) | RESPIRATORY_TRACT | Status: DC | PRN

## 2015-05-15 MED ORDER — BENAZEPRIL HCL 5 MG PO TABS: 5.0000 mg | ORAL_TABLET | Freq: Every day | ORAL | Status: DC

## 2015-05-15 NOTE — Patient Instructions (Addendum)
Infectious Mononucleosis °Infectious mononucleosis is an infection caused by a virus. This illness is often called "mono." It causes symptoms that affect various areas of the body, including the throat, upper air passages, and lymph glands. The liver or spleen may also be affected. °The virus spreads from person to person through close contact. The illness is usually not serious and often goes away in 2-4 weeks without treatment. In rare cases, symptoms can be more severe and last longer, sometimes up to several months. Because the illness can sometimes cause the liver or spleen to become enlarged, you should not participate in contact sports or strenuous exercise until your health care provider approves. °CAUSES  °Infectious mononucleosis is caused by the Epstein-Barr virus. This virus spreads through contact with an infected person's saliva or other bodily fluids. It is often spread through kissing. It may also spread through coughing or sharing utensils or drinking glasses that were recently used by an infected person. An infected person will not always appear ill but can still spread the virus. °RISK FACTORS °This illness is most common in adolescents and young adults. °SIGNS AND SYMPTOMS  °The most common symptoms of infectious mononucleosis are: °· Sore throat.   °· Headache.   °· Fatigue.   °· Muscle aches.   °· Swollen glands.   °· Fever.   °· Poor appetite.   °· Enlarged liver or spleen.   °Some less common symptoms that can also occur include: °· Rash. This is more common if you take antibiotic medicines. °· Feeling sick to your stomach (nauseous).   °· Abdominal pain.   °DIAGNOSIS  °Your health care provider will take your medical history and do a physical exam. Blood tests can be done to confirm the diagnosis.  °TREATMENT  °Infectious mononucleosis usually goes away on its own with time. It cannot be cured with medicines, but medicines are sometimes used to relieve symptoms. Steroid medicine is sometimes  needed if the swelling in the throat causes breathing or swallowing problems. Treatment in a hospital is sometimes needed for severe cases.  °HOME CARE INSTRUCTIONS  °· Rest as needed.   °· Do not participate in contact sports, strenuous exercise, or heavy lifting until your health care provider approves. The liver and spleen could be seriously injured if they are enlarged from the illness. You may need to wait a couple months before participating in sports.   °· Drink enough fluid to keep your urine clear or pale yellow.   °· Do not drink alcohol. °· Take medicines only as directed by your health care provider. Children under 18 years of age should not take aspirin because of the association with Reye syndrome.   °· Eat soft foods. Cold foods such as ice cream or frozen ice pops can soothe a sore throat. °· If you have a sore throat, gargle with a mixture of salt and water. This may help relieve your discomfort. Mix 1 tsp of salt in 1 cup of warm water. Sucking on hard candy may also help.   °· Start regular activities gradually after the fever is gone. Be sure to rest when tired.   °· Avoid kissing or sharing utensils or drinking glasses until your health care provider tells you that you are no longer contagious.   °PREVENTION  °To avoid spreading the virus, do not kiss anyone or share utensils, drinking glasses, or food until your health care provider tells you that you are no longer contagious. °SEEK MEDICAL CARE IF:  °· Your fever is not gone after 10 days. °· You have swollen lymph nodes that are not   back to normal after 4 weeks. °· Your activity level is not back to normal after 2 months.   °· You have yellow coloring to your eyes and skin (jaundice). °· You have constipation.   °SEEK IMMEDIATE MEDICAL CARE IF:  °· You have severe pain in the abdomen or shoulder. °· You are drooling. °· You have trouble swallowing. °· You have trouble breathing. °· You develop a stiff neck. °· You develop a severe  headache. °· You cannot stop throwing up (vomiting). °· You have convulsions. °· You are confused. °· You have trouble with balance. °· You have signs of dehydration. These may include: °¨ Weakness. °¨ Sunken eyes. °¨ Pale skin. °¨ Dry mouth. °¨ Rapid breathing or pulse. °  °This information is not intended to replace advice given to you by your health care provider. Make sure you discuss any questions you have with your health care provider. °  °Document Released: 03/25/2000 Document Revised: 04/18/2014 Document Reviewed: 12/03/2013 °Elsevier Interactive Patient Education ©2016 [employer] ° °

## 2015-05-15 NOTE — Progress Notes (Signed)
Date:  05/15/2015   Name:  Kendra Mullins   DOB:  1960/11/21   MRN:  [id number]   Chief Complaint: Fatigue Exposure to Mono - granddaughter was diagnosed with Mono last week.  She was around her at the time she was diagnosed.  She complains of headache, sore throat, myalgia, low grade fever and mild abdominal discomfort.  She has not taken any medication.  HTN - doing well on current therapy.  Pneumonia - treated by UC in October.  CT showed patchy LLL infiltrate and recommended follow up in 3 months for clearing.  Pt continues to smoke but has no specific chest symptoms.  Neck pain/stiffness - started several weeks ago.  No injury noted.  Very uncomfortable trying to sleep.  No arm numbness or weakness. Has not taken any medication - just tried to adjust her pillow while sleeping.    Review of Systems  Constitutional: Positive for fever and fatigue. Negative for chills.  HENT: Positive for sore throat and trouble swallowing.   Respiratory: Negative for cough, shortness of breath and wheezing.   Cardiovascular: Negative for chest pain, palpitations and leg swelling.  Gastrointestinal: Negative for vomiting, abdominal pain, diarrhea and constipation.  Genitourinary: Negative for dysuria.  Musculoskeletal: Positive for myalgias, arthralgias and neck stiffness.  Neurological: Positive for headaches. Negative for dizziness, tremors, light-headedness and numbness.    Patient Active Problem List   Diagnosis Date Noted  . 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) 09/15/2014  . Ankle inflammation 09/15/2014  . Anxiety 09/15/2014  . Cough, persistent 09/15/2014  . Smokes tobacco daily 09/15/2014  . Essential (primary) hypertension 09/15/2014  . Arthralgia of hand 09/15/2014  . Hot flash, menopausal 09/15/2014  . MI (mitral incompetence) 09/15/2014  . Chronic obstructive pulmonary emphysema ([place]) 09/15/2014  . Low weight 09/15/2014    Prior to Admission medications   Medication Sig  Start Date End Date Taking? Authorizing Provider  ALPRAZolam Duanne Moron) 0.5 MG tablet Take 0.5 mg by mouth 2 (two) times daily as needed for anxiety.    Historical Provider, MD  benazepril (LOTENSIN) 5 MG tablet Take 5 mg by mouth daily.    Historical Provider, MD  buPROPion (WELLBUTRIN XL) 150 MG 24 hr tablet Take 1 tablet (150 mg total) by mouth daily. 02/13/15   Glean Hess, MD  CARDIZEM LA 240 MG 24 hr tablet Take 1 tablet (240 mg total) by mouth daily. 02/16/15   Glean Hess, MD  fluticasone (FLONASE) 50 MCG/ACT nasal spray Place 2 sprays into both nostrils daily as needed for rhinitis.    Historical Provider, MD  Fluticasone-Salmeterol (ADVAIR) 100-50 MCG/DOSE AEPB Inhale 1 puff into the lungs 2 (two) times daily. 01/20/15   Glean Hess, MD  ipratropium (ATROVENT HFA) 17 MCG/ACT inhaler Inhale 2 puffs into the lungs every 4 (four) hours as needed for wheezing. 12/13/14   Sherlene Shams, MD  PROVENTIL HFA 108 (90 BASE) MCG/ACT inhaler Inhale 2 puffs into the lungs 4 (four) times daily as needed. 01/14/15   Historical Provider, MD  PROVENTIL HFA 108 (90 Base) MCG/ACT inhaler INHALE 2 PUFFS BY MOUTH FOUR TIMES DAILY 05/14/15   Glean Hess, MD    Allergies  Allergen Reactions  . Azithromycin Nausea Only  . Codeine Nausea And Vomiting    Past Surgical History  Procedure Laterality Date  . Abdominal hysterectomy  1986    Social History  Substance Use Topics  . Smoking status: Current Every Day Smoker -- 1.00 packs/day  Types: Cigarettes  . Smokeless tobacco: Never Used  . Alcohol Use: 0.0 oz/week    0 Standard drinks or equivalent per week     Comment: 6-7 each day     Medication list has been reviewed and updated.   Physical Exam  Constitutional: She is oriented to person, place, and time. She appears well-developed. No distress.  HENT:  Head: Normocephalic and atraumatic.  Right Ear: Tympanic membrane and ear canal normal.  Left Ear: Tympanic membrane and ear  canal normal.  Nose: Right sinus exhibits no maxillary sinus tenderness. Left sinus exhibits no maxillary sinus tenderness.  Mouth/Throat: No posterior oropharyngeal edema or posterior oropharyngeal erythema.  Neck: Muscular tenderness present. Carotid bruit is not present. Decreased range of motion present.  Cardiovascular: Normal rate, regular rhythm and normal heart sounds.   Pulmonary/Chest: Effort normal. No accessory muscle usage. No respiratory distress. She has decreased breath sounds. She has no wheezes. She has no rhonchi.  Abdominal: Soft. Normal appearance. There is splenomegaly. Hepatosplenomegaly: borderline enlargement and mild tenderness. There is tenderness in the left upper quadrant.  Lymphadenopathy:    She has no cervical adenopathy.    She has no axillary adenopathy.  Neurological: She is alert and oriented to person, place, and time.  Skin: Skin is warm, dry and intact. No rash noted.  Psychiatric: She has a normal mood and affect. Her behavior is normal. Thought content normal.    BP 110/64 mmHg  Pulse 101  Temp(Src) 98 F (36.7 C) (Oral)  Ht '5\' 5"'$  (1.651 m)  Wt 85 lb (38.556 kg)  BMI 14.14 kg/m2  SpO2 99%  Assessment and Plan: 1. Exposure to mononucleosis syndrome Education provided Rest with adequate fluids Tylenol for symptoms - CBC with Differential/Platelet - Monospot  2. Other emphysema ([place]) Need repeat CXR - DG Chest 2 View; Future - PROVENTIL HFA 108 (90 Base) MCG/ACT inhaler; Inhale 2 puffs into the lungs 4 (four) times daily as needed.  Dispense: 18 g; Refill: 5  3. Essential (primary) hypertension controlled - benazepril (LOTENSIN) 5 MG tablet; Take 1 tablet (5 mg total) by mouth daily.  Dispense: 30 tablet; Refill: 5  4. Neck muscle spasm Use heat and tylenol as needed   Halina Maidens, MD [facility]  05/15/2015

## 2015-05-16 LAB — CBC WITH DIFFERENTIAL/PLATELET
Basophils Absolute: 0.1 10*3/uL (ref 0.0–0.2)
Basos: 1 %
EOS (ABSOLUTE): 0.1 10*3/uL (ref 0.0–0.4)
EOS: 2 %
HEMATOCRIT: 44.6 % (ref 34.0–46.6)
Hemoglobin: 15.7 g/dL (ref 11.1–15.9)
IMMATURE GRANULOCYTES: 0 %
Immature Grans (Abs): 0 10*3/uL (ref 0.0–0.1)
LYMPHS ABS: 2.7 10*3/uL (ref 0.7–3.1)
Lymphs: 30 %
MCH: 32.8 pg (ref 26.6–33.0)
MCHC: 35.2 g/dL (ref 31.5–35.7)
MCV: 93 fL (ref 79–97)
MONOS ABS: 0.9 10*3/uL (ref 0.1–0.9)
Monocytes: 10 %
NEUTROS PCT: 57 %
Neutrophils Absolute: 5.2 10*3/uL (ref 1.4–7.0)
Platelets: 317 10*3/uL (ref 150–379)
RBC: 4.79 x10E6/uL (ref 3.77–5.28)
RDW: 12.2 % — AB (ref 12.3–15.4)
WBC: 9.1 10*3/uL (ref 3.4–10.8)

## 2015-05-16 LAB — MONONUCLEOSIS SCREEN: Mono Screen: NEGATIVE

## 2015-05-18 ENCOUNTER — Telehealth: Payer: Self-pay

## 2015-05-18 NOTE — Telephone Encounter (Signed)
Spoke with patient. Patient advised of all results and verbalized understanding. Will call back with any future questions or concerns. MAH  

## 2015-05-18 NOTE — Telephone Encounter (Signed)
-----   Message from Glean Hess, MD sent at 05/16/2015  3:49 PM EST ----- CXR is clear except for chronic mild changes of emphysema.

## 2015-05-18 NOTE — Telephone Encounter (Signed)
-----   Message from Glean Hess, MD sent at 05/16/2015  3:47 PM EST ----- CBC is normal - not anemic.  Mono spot is negative.

## 2015-06-24 ENCOUNTER — Encounter: Payer: Self-pay | Admitting: *Deleted

## 2015-06-24 ENCOUNTER — Ambulatory Visit
Admission: EM | Admit: 2015-06-24 | Discharge: 2015-06-24 | Disposition: A | Discharge status: Home / Self Care | Payer: Medicaid Other | Attending: Family Medicine | Admitting: Family Medicine

## 2015-06-24 DIAGNOSIS — J449 Chronic obstructive pulmonary disease, unspecified: Secondary | ICD-10-CM | POA: Insufficient documentation

## 2015-06-24 DIAGNOSIS — F4310 Post-traumatic stress disorder, unspecified: Secondary | ICD-10-CM | POA: Diagnosis not present

## 2015-06-24 DIAGNOSIS — I10 Essential (primary) hypertension: Secondary | ICD-10-CM | POA: Diagnosis not present

## 2015-06-24 DIAGNOSIS — Z79899 Other long term (current) drug therapy: Secondary | ICD-10-CM | POA: Diagnosis not present

## 2015-06-24 DIAGNOSIS — J441 Chronic obstructive pulmonary disease with (acute) exacerbation: Secondary | ICD-10-CM | POA: Diagnosis not present

## 2015-06-24 DIAGNOSIS — F17210 Nicotine dependence, cigarettes, uncomplicated: Secondary | ICD-10-CM | POA: Insufficient documentation

## 2015-06-24 DIAGNOSIS — R05 Cough: Secondary | ICD-10-CM | POA: Diagnosis present

## 2015-06-24 DIAGNOSIS — R0981 Nasal congestion: Secondary | ICD-10-CM | POA: Diagnosis present

## 2015-06-24 LAB — RAPID INFLUENZA A&B ANTIGENS (ARMC ONLY): INFLUENZA A (ARMC): NEGATIVE

## 2015-06-24 LAB — RAPID INFLUENZA A&B ANTIGENS: Influenza B (ARMC): NEGATIVE

## 2015-06-24 MED ORDER — DOXYCYCLINE HYCLATE 100 MG PO TABS: 100.0000 mg | ORAL_TABLET | Freq: Two times a day (BID) | ORAL | Status: DC

## 2015-06-24 MED ORDER — PREDNISONE 20 MG PO TABS: ORAL_TABLET | ORAL | Status: DC

## 2015-06-24 NOTE — ED Provider Notes (Signed)
CSN: [id number]     Arrival date & time 06/24/15  1453 History   First MD Initiated Contact with Patient 06/24/15 [phone]     Chief Complaint  Patient presents with  . Cough  . Nasal Congestion   (Consider location/radiation/quality/duration/timing/severity/associated sxs/prior Treatment) Patient is a 55 y.o. female presenting with cough and URI. The history is provided by the patient.  Cough Associated symptoms: fever and headaches   URI Presenting symptoms: congestion, cough (productive) and fever   Severity:  Moderate Onset quality:  Sudden Duration:  2 weeks Timing:  Constant Progression:  Worsening Chronicity:  New Ineffective treatments:  OTC medications Associated symptoms: headaches   Risk factors: chronic respiratory disease (copd) and sick contacts   Risk factors: not elderly, no chronic cardiac disease, no chronic kidney disease, no diabetes mellitus, no immunosuppression, no recent illness and no recent travel     Past Medical History  Diagnosis Date  . COPD (chronic obstructive pulmonary disease) ([place])   . Hypertension   . PTSD (post-traumatic stress disorder)    Past Surgical History  Procedure Laterality Date  . Abdominal hysterectomy  1986   Family History  Problem Relation Age of Onset  . Cancer Mother     breast   Social History  Substance Use Topics  . Smoking status: Current Every Day Smoker -- 1.00 packs/day    Types: Cigarettes  . Smokeless tobacco: Never Used  . Alcohol Use: 0.0 oz/week    0 Standard drinks or equivalent per week     Comment: 6-7 each day   OB History    Gravida Para Term Preterm AB TAB SAB Ectopic Multiple Living   '3    1  1   2     '$ Review of Systems  Constitutional: Positive for fever.  HENT: Positive for congestion.   Respiratory: Positive for cough (productive).   Neurological: Positive for headaches.    Allergies  Azithromycin and Codeine  Home Medications   Prior to Admission medications   Medication Sig  Start Date End Date Taking? Authorizing Provider  ALPRAZolam Duanne Moron) 0.5 MG tablet Take 0.5 mg by mouth 2 (two) times daily as needed for anxiety.   Yes Historical Provider, MD  benazepril (LOTENSIN) 5 MG tablet Take 1 tablet (5 mg total) by mouth daily. 05/15/15  Yes Glean Hess, MD  CARDIZEM LA 240 MG 24 hr tablet Take 1 tablet (240 mg total) by mouth daily. 02/16/15  Yes Glean Hess, MD  fluticasone (FLONASE) 50 MCG/ACT nasal spray Place 2 sprays into both nostrils daily as needed for rhinitis.   Yes Historical Provider, MD  Fluticasone-Salmeterol (ADVAIR) 100-50 MCG/DOSE AEPB Inhale 1 puff into the lungs 2 (two) times daily. 01/20/15  Yes Glean Hess, MD  ipratropium (ATROVENT HFA) 17 MCG/ACT inhaler Inhale 2 puffs into the lungs every 4 (four) hours as needed for wheezing. 12/13/14  Yes Sherlene Shams, MD  PROVENTIL HFA 108 [phone] Base) MCG/ACT inhaler Inhale 2 puffs into the lungs 4 (four) times daily as needed. 05/15/15  Yes Glean Hess, MD  buPROPion (WELLBUTRIN XL) 150 MG 24 hr tablet Take 1 tablet (150 mg total) by mouth daily. Patient not taking: Reported on 05/15/2015 02/13/15   Glean Hess, MD  doxycycline (VIBRA-TABS) 100 MG tablet Take 1 tablet (100 mg total) by mouth 2 (two) times daily. 06/24/15   Norval Gable, MD  predniSONE (DELTASONE) 20 MG tablet 3 tabs po qd for 2 days, then 2 tabs po  qd for 3 days, then 1 tab po qd for 3 days, then half a tab po qd for 3 days 06/24/15   Norval Gable, MD   Meds Ordered and Administered this Visit  Medications - No data to display  BP 107/78 mmHg  Pulse 113  Temp(Src) 97.9 F (36.6 C) (Oral)  Resp 18  Ht '5\' 4"'$  (1.626 m)  Wt 85 lb (38.556 kg)  BMI 14.58 kg/m2  SpO2 100% No data found.   Physical Exam  Constitutional: She appears well-developed and well-nourished. No distress.  HENT:  Head: Normocephalic and atraumatic.  Right Ear: Tympanic membrane, external ear and ear canal normal.  Left Ear: Tympanic membrane,  external ear and ear canal normal.  Nose: No nose lacerations, sinus tenderness, nasal deformity, septal deviation or nasal septal hematoma. No epistaxis.  No foreign bodies.  Mouth/Throat: Uvula is midline and mucous membranes are normal. Posterior oropharyngeal erythema present. No oropharyngeal exudate.  Eyes: Conjunctivae and EOM are normal. Pupils are equal, round, and reactive to light. Right eye exhibits no discharge. Left eye exhibits no discharge. No scleral icterus.  Neck: Normal range of motion. Neck supple. No thyromegaly present.  Cardiovascular: Normal rate, regular rhythm and normal heart sounds.   Pulmonary/Chest: Effort normal. No respiratory distress. She has decreased breath sounds (throughout). She has no wheezes. She has no rales.  Lymphadenopathy:    She has no cervical adenopathy.  Skin: She is not diaphoretic.  Nursing note and vitals reviewed.   ED Course  Procedures (including critical care time)  Labs Review Labs Reviewed  RAPID INFLUENZA A&B ANTIGENS ([place])    Imaging Review No results found.   Visual Acuity Review  Right Eye Distance:   Left Eye Distance:   Bilateral Distance:    Right Eye Near:   Left Eye Near:    Bilateral Near:         MDM   1. COPD exacerbation [facility])    Discharge Medication List as of 06/24/2015  5:07 PM    START taking these medications   Details  doxycycline (VIBRA-TABS) 100 MG tablet Take 1 tablet (100 mg total) by mouth 2 (two) times daily., Starting 06/24/2015, Until Discontinued, Normal    predniSONE (DELTASONE) 20 MG tablet 3 tabs po qd for 2 days, then 2 tabs po qd for 3 days, then 1 tab po qd for 3 days, then half a tab po qd for 3 days, Normal        1. Lab results and diagnosis reviewed with patient 2. rx as per orders above; reviewed possible side effects, interactions, risks and benefits  3. Recommend supportive treatment with rest; recommend smoking cessation 4. Follow-up prn if symptoms worsen  or don't improve  Norval Gable, MD 06/24/15 1717

## 2015-06-24 NOTE — ED Notes (Signed)
Patient started having symptoms nasal congestion and headache started 2 weeks. Additional symptoms of severe cough and chest congestion started 3 days ago. OTC medications have not resolved symptoms.

## 2015-09-14 ENCOUNTER — Other Ambulatory Visit: Payer: Self-pay | Admitting: Internal Medicine

## 2015-10-05 NOTE — Telephone Encounter (Signed)
Left 2 msgs for pt to call the office, s/w pt one time and said she would call back to make this appt

## 2015-10-07 ENCOUNTER — Other Ambulatory Visit
Admission: RE | Admit: 2015-10-07 | Discharge: 2015-10-07 | Disposition: A | Discharge status: Home / Self Care | Payer: Medicaid Other | Source: Ambulatory Visit | Attending: Internal Medicine | Admitting: Internal Medicine

## 2015-10-07 ENCOUNTER — Encounter: Payer: Self-pay | Admitting: Internal Medicine

## 2015-10-07 ENCOUNTER — Ambulatory Visit (INDEPENDENT_AMBULATORY_CARE_PROVIDER_SITE_OTHER): Payer: Medicaid Other | Admitting: Internal Medicine

## 2015-10-07 VITALS — BP 98/72 | HR 74 | Resp 16 | Ht 64.0 in | Wt 84.2 lb

## 2015-10-07 DIAGNOSIS — N3000 Acute cystitis without hematuria: Secondary | ICD-10-CM | POA: Diagnosis not present

## 2015-10-07 DIAGNOSIS — Z8711 Personal history of peptic ulcer disease: Secondary | ICD-10-CM | POA: Diagnosis not present

## 2015-10-07 LAB — POCT URINALYSIS DIPSTICK
BILIRUBIN UA: NEGATIVE
GLUCOSE UA: NEGATIVE
Ketones, UA: 80
Nitrite, UA: POSITIVE
Protein, UA: 300
SPEC GRAV UA: 1.015
UROBILINOGEN UA: 0.2
pH, UA: 6.5

## 2015-10-07 LAB — CBC WITH DIFFERENTIAL/PLATELET
Basophils Absolute: 0.1 10*3/uL (ref 0–0.1)
Basophils Relative: 1 %
EOS ABS: 0.1 10*3/uL (ref 0–0.7)
HCT: 45.5 % (ref 35.0–47.0)
Hemoglobin: 15.4 g/dL (ref 12.0–16.0)
LYMPHS ABS: 2.7 10*3/uL (ref 1.0–3.6)
Lymphocytes Relative: 21 %
MCH: 32.3 pg (ref 26.0–34.0)
MCHC: 33.8 g/dL (ref 32.0–36.0)
MCV: 95.4 fL (ref 80.0–100.0)
MONO ABS: 1.4 10*3/uL — AB (ref 0.2–0.9)
Neutro Abs: 8.7 10*3/uL — ABNORMAL HIGH (ref 1.4–6.5)
Neutrophils Relative %: 66 %
PLATELETS: 251 10*3/uL (ref 150–440)
RBC: 4.77 MIL/uL (ref 3.80–5.20)
RDW: 11.9 % (ref 11.5–14.5)
WBC: 13 10*3/uL — ABNORMAL HIGH (ref 3.6–11.0)

## 2015-10-07 MED ORDER — OMEPRAZOLE 40 MG PO CPDR
40.0000 mg | DELAYED_RELEASE_CAPSULE | Freq: Every day | ORAL | Status: DC
Start: 2015-10-07 — End: 2017-06-09

## 2015-10-07 MED ORDER — CIPROFLOXACIN HCL 250 MG PO TABS
250.0000 mg | ORAL_TABLET | Freq: Two times a day (BID) | ORAL | Status: DC
Start: 2015-10-07 — End: 2016-01-25

## 2015-10-07 MED ORDER — PHENAZOPYRIDINE HCL 95 MG PO TABS: 95.0000 mg | ORAL_TABLET | Freq: Three times a day (TID) | ORAL | Status: DC | PRN

## 2015-10-07 NOTE — Progress Notes (Signed)
Date:  10/07/2015   Name:  Kendra Mullins   DOB:  1961-02-24   MRN:  [id number]   Chief Complaint: Dysuria Dysuria  This is a new problem. The current episode started 1 to 4 weeks ago. The problem occurs every urination. The problem has been gradually worsening. The quality of the pain is described as burning. The pain is moderate. There has been no fever. Associated symptoms include frequency and urgency. Pertinent negatives include no chills, flank pain or hematuria. She has tried nothing for the symptoms.   Dark stools - pt reports intermittent black stools. Some epigastric discomfort and heartburn.  Also history of being treated for gastric ulcers in her 20's.  She drinks beer daily and does not take any zantac or PPI.  She denies regular use of nsaids.   Review of Systems  Constitutional: Negative for fever, chills and diaphoresis.  Respiratory: Negative for choking, chest tightness and shortness of breath.   Cardiovascular: Negative for chest pain and palpitations.  Gastrointestinal: Positive for abdominal pain. Negative for diarrhea and constipation.  Genitourinary: Positive for dysuria, urgency and frequency. Negative for hematuria and flank pain.  Neurological: Negative for dizziness and headaches.    Patient Active Problem List   Diagnosis Date Noted  . Continuous chronic alcoholism ([place]) 09/15/2014  . Ankle inflammation 09/15/2014  . Anxiety 09/15/2014  . Cough, persistent 09/15/2014  . Smokes tobacco daily 09/15/2014  . Essential (primary) hypertension 09/15/2014  . Arthralgia of hand 09/15/2014  . Hot flash, menopausal 09/15/2014  . MI (mitral incompetence) 09/15/2014  . Chronic obstructive pulmonary emphysema ([place]) 09/15/2014  . Low weight 09/15/2014    Prior to Admission medications   Medication Sig Start Date End Date Taking? Authorizing Provider  ALPRAZolam Duanne Moron) 0.5 MG tablet Take 0.5 mg by mouth 2 (two) times daily as needed for anxiety.   Yes  Historical Provider, MD  benazepril (LOTENSIN) 5 MG tablet Take 1 tablet (5 mg total) by mouth daily. 05/15/15  Yes Glean Hess, MD  buPROPion (WELLBUTRIN XL) 150 MG 24 hr tablet Take 1 tablet (150 mg total) by mouth daily. 02/13/15  Yes Glean Hess, MD  CARDIZEM LA 240 MG 24 hr tablet TAKE 1 TABLET BY MOUTH DAILY 09/14/15  Yes Glean Hess, MD  doxycycline (VIBRA-TABS) 100 MG tablet Take 1 tablet (100 mg total) by mouth 2 (two) times daily. 06/24/15  Yes Norval Gable, MD  fluticasone (FLONASE) 50 MCG/ACT nasal spray Place 2 sprays into both nostrils daily as needed for rhinitis.   Yes Historical Provider, MD  Fluticasone-Salmeterol (ADVAIR) 100-50 MCG/DOSE AEPB Inhale 1 puff into the lungs 2 (two) times daily. 01/20/15  Yes Glean Hess, MD  ipratropium (ATROVENT HFA) 17 MCG/ACT inhaler Inhale 2 puffs into the lungs every 4 (four) hours as needed for wheezing. 12/13/14  Yes Sherlene Shams, MD  predniSONE (DELTASONE) 20 MG tablet 3 tabs po qd for 2 days, then 2 tabs po qd for 3 days, then 1 tab po qd for 3 days, then half a tab po qd for 3 days 06/24/15  Yes Norval Gable, MD  PROVENTIL HFA 108 [phone] Base) MCG/ACT inhaler Inhale 2 puffs into the lungs 4 (four) times daily as needed. 05/15/15   Glean Hess, MD    Allergies  Allergen Reactions  . Azithromycin Nausea Only  . Codeine Nausea And Vomiting    Past Surgical History  Procedure Laterality Date  . Abdominal hysterectomy  1986  Social History  Substance Use Topics  . Smoking status: Current Every Day Smoker -- 1.00 packs/day    Types: Cigarettes  . Smokeless tobacco: Never Used  . Alcohol Use: 0.0 oz/week    0 Standard drinks or equivalent per week     Comment: 6-7 each day     Medication list has been reviewed and updated.   Physical Exam  Constitutional: She is oriented to person, place, and time. She appears well-developed. No distress.  HENT:  Head: Normocephalic and atraumatic.  Neck: Normal range of  motion. Neck supple. No thyromegaly present.  Cardiovascular: Regular rhythm and normal pulses.   Pulmonary/Chest: Effort normal and breath sounds normal. No respiratory distress. She has no decreased breath sounds.  Abdominal: Soft. Bowel sounds are normal. There is no hepatosplenomegaly. There is tenderness in the epigastric area. There is no CVA tenderness.  Musculoskeletal: Normal range of motion.  Neurological: She is alert and oriented to person, place, and time.  Skin: Skin is warm and dry. No rash noted.  Psychiatric: She has a normal mood and affect. Her behavior is normal. Thought content normal.    BP 98/72 mmHg  Pulse 74  Resp 16  Ht '5\' 4"'$  (1.626 m)  Wt 84 lb 3.2 oz (38.193 kg)  BMI 14.45 kg/m2  SpO2 100%  Assessment and Plan: 1. Acute cystitis without hematuria - ciprofloxacin (CIPRO) 250 MG tablet; Take 1 tablet (250 mg total) by mouth 2 (two) times daily.  Dispense: 14 tablet; Refill: 0 - phenazopyridine (PYRIDIUM) 95 MG tablet; Take 1 tablet (95 mg total) by mouth 3 (three) times daily as needed for pain.  Dispense: 10 tablet; Refill: 0 - POCT urinalysis dipstick  2. H/O peptic ulcer Advised to reduce alcohol intake - CBC with Differential/Platelet - H. pylori antibody, IgG - omeprazole (PRILOSEC) 40 MG capsule; Take 1 capsule (40 mg total) by mouth daily.  Dispense: 30 capsule; Refill: [place], MD [facility]  10/07/2015

## 2015-10-08 LAB — H. PYLORI ANTIBODY, IGG

## 2015-10-09 ENCOUNTER — Telehealth: Payer: Self-pay

## 2015-10-09 ENCOUNTER — Other Ambulatory Visit: Payer: Self-pay | Admitting: Internal Medicine

## 2015-10-09 NOTE — Telephone Encounter (Signed)
UTI comes from bacteria that get into the bladder and multiply.  Should use sun screen and shade if taking Cipro.

## 2015-10-09 NOTE — Telephone Encounter (Signed)
Patient feeling better. Wants to know if Cipro really is a med that you can not go out in sun while on. Says it on bottle and she has plans for July 4. Also wants to know WHERE this UTI is coming from?

## 2015-10-16 ENCOUNTER — Ambulatory Visit: Payer: Medicaid Other | Admitting: Internal Medicine

## 2015-10-19 ENCOUNTER — Telehealth: Payer: Self-pay

## 2015-10-19 NOTE — Telephone Encounter (Signed)
It is fine to stop medication if symptoms have resolved.

## 2015-10-19 NOTE — Telephone Encounter (Signed)
Feeling much better. Would like to STOP Omeprazole. Advised labs but does want to take a break from the meds.

## 2015-10-27 ENCOUNTER — Ambulatory Visit: Payer: Medicaid Other

## 2015-10-27 ENCOUNTER — Ambulatory Visit
Admission: EM | Admit: 2015-10-27 | Discharge: 2015-10-27 | Disposition: A | Discharge status: Home / Self Care | Payer: Medicaid Other | Attending: Family Medicine | Admitting: Family Medicine

## 2015-10-27 ENCOUNTER — Encounter: Payer: Self-pay | Admitting: Emergency Medicine

## 2015-10-27 DIAGNOSIS — Z79899 Other long term (current) drug therapy: Secondary | ICD-10-CM | POA: Insufficient documentation

## 2015-10-27 DIAGNOSIS — J449 Chronic obstructive pulmonary disease, unspecified: Secondary | ICD-10-CM | POA: Diagnosis not present

## 2015-10-27 DIAGNOSIS — F17210 Nicotine dependence, cigarettes, uncomplicated: Secondary | ICD-10-CM | POA: Diagnosis not present

## 2015-10-27 DIAGNOSIS — F4310 Post-traumatic stress disorder, unspecified: Secondary | ICD-10-CM | POA: Insufficient documentation

## 2015-10-27 DIAGNOSIS — I10 Essential (primary) hypertension: Secondary | ICD-10-CM | POA: Insufficient documentation

## 2015-10-27 DIAGNOSIS — W1839XA Other fall on same level, initial encounter: Secondary | ICD-10-CM | POA: Diagnosis not present

## 2015-10-27 DIAGNOSIS — Z90710 Acquired absence of both cervix and uterus: Secondary | ICD-10-CM | POA: Insufficient documentation

## 2015-10-27 DIAGNOSIS — Y92003 Bedroom of unspecified non-institutional (private) residence as the place of occurrence of the external cause: Secondary | ICD-10-CM | POA: Diagnosis not present

## 2015-10-27 DIAGNOSIS — M25532 Pain in left wrist: Secondary | ICD-10-CM | POA: Diagnosis present

## 2015-10-27 DIAGNOSIS — S60212A Contusion of left wrist, initial encounter: Secondary | ICD-10-CM | POA: Diagnosis not present

## 2015-10-27 MED ORDER — KETOROLAC TROMETHAMINE 60 MG/2ML IM SOLN
60.0000 mg | Freq: Once | INTRAMUSCULAR | Status: AC
  Administered 2015-10-27: 60 mg via INTRAMUSCULAR

## 2015-10-27 MED ORDER — HYDROCODONE-ACETAMINOPHEN 5-325 MG PO TABS: 1.0000 | ORAL_TABLET | Freq: Three times a day (TID) | ORAL | Status: DC | PRN

## 2015-10-27 NOTE — ED Provider Notes (Signed)
CSN: [id number]     Arrival date & time 10/27/15  1855 History   First MD Initiated Contact with Patient 10/27/15 1910    Nurses notes were reviewed.  Chief Complaint  Patient presents with  . Wrist Pain   Patient with left wrist pain. She states that her daughter was visiting from [place] and she had several for grandchildren together in the bed but 2 of them on the floor which each went to check on them about 11:30 at night she almost tripped over one of. In trying to prevent hurting her low grandchild she slipped and fell and landed on her left wrist. She states that she's not been able to sleep she is having ongoing left wrist pain. States when she hangs left wrist down seems to be worse.  Past medical history she has a history of COPD hypertension PTSD. She's had abdominal hysterectomy and mother had breast cancer. She does still smoke.    (Consider location/radiation/quality/duration/timing/severity/associated sxs/prior Treatment) Patient is a 55 y.o. female presenting with wrist pain. The history is provided by the patient. No language interpreter was used.  Wrist Pain This is a new problem. The current episode started yesterday. The problem occurs constantly. The problem has not changed since onset.Pertinent negatives include no chest pain, no abdominal pain, no headaches and no shortness of breath. The symptoms are aggravated by exertion. Nothing relieves the symptoms. Treatments tried: Patient with some improvement of pain with the half tablet of Vicodin she obtained from her daughter. The treatment provided no relief.    Past Medical History  Diagnosis Date  . COPD (chronic obstructive pulmonary disease) ([place])   . Hypertension   . PTSD (post-traumatic stress disorder)    Past Surgical History  Procedure Laterality Date  . Abdominal hysterectomy  1986   Family History  Problem Relation Age of Onset  . Cancer Mother     breast   Social History  Substance Use Topics  .  Smoking status: Current Every Day Smoker -- 1.00 packs/day    Types: Cigarettes  . Smokeless tobacco: Never Used  . Alcohol Use: 0.0 oz/week    0 Standard drinks or equivalent per week     Comment: 6-7 each day   OB History    Gravida Para Term Preterm AB TAB SAB Ectopic Multiple Living   '3    1  1   2     '$ Review of Systems  Respiratory: Negative for shortness of breath.   Cardiovascular: Negative for chest pain.  Gastrointestinal: Negative for abdominal pain.  Musculoskeletal: Positive for myalgias, joint swelling and arthralgias.  Neurological: Negative for headaches.  All other systems reviewed and are negative.   Allergies  Azithromycin and Codeine  Home Medications   Prior to Admission medications   Medication Sig Start Date End Date Taking? Authorizing Provider  ALPRAZolam Duanne Moron) 0.5 MG tablet Take 0.5 mg by mouth 2 (two) times daily as needed for anxiety.    Historical Provider, MD  benazepril (LOTENSIN) 5 MG tablet Take 1 tablet (5 mg total) by mouth daily. 05/15/15   Glean Hess, MD  buPROPion (WELLBUTRIN XL) 150 MG 24 hr tablet Take 1 tablet (150 mg total) by mouth daily. 02/13/15   Glean Hess, MD  CARDIZEM LA 240 MG 24 hr tablet TAKE 1 TABLET BY MOUTH DAILY 09/14/15   Glean Hess, MD  ciprofloxacin (CIPRO) 250 MG tablet Take 1 tablet (250 mg total) by mouth 2 (two) times daily. 10/07/15  Glean Hess, MD  fluticasone [facility]) 50 MCG/ACT nasal spray Place 2 sprays into both nostrils daily as needed for rhinitis.    Historical Provider, MD  Fluticasone-Salmeterol (ADVAIR) 100-50 MCG/DOSE AEPB Inhale 1 puff into the lungs 2 (two) times daily. 01/20/15   Glean Hess, MD  HYDROcodone-acetaminophen (NORCO) 5-325 MG tablet Take 1 tablet by mouth every 8 (eight) hours as needed for moderate pain. 10/27/15   Frederich Cha, MD  ipratropium (ATROVENT HFA) 17 MCG/ACT inhaler Inhale 2 puffs into the lungs every 4 (four) hours as needed for wheezing. 12/13/14   Sherlene Shams, MD  omeprazole (PRILOSEC) 40 MG capsule Take 1 capsule (40 mg total) by mouth daily. 10/07/15   Glean Hess, MD  phenazopyridine (PYRIDIUM) 95 MG tablet Take 1 tablet (95 mg total) by mouth 3 (three) times daily as needed for pain. 10/07/15   Glean Hess, MD  predniSONE (DELTASONE) 20 MG tablet 3 tabs po qd for 2 days, then 2 tabs po qd for 3 days, then 1 tab po qd for 3 days, then half a tab po qd for 3 days Patient not taking: Reported on 10/07/2015 06/24/15   Norval Gable, MD  PROVENTIL HFA 108 [phone] Base) MCG/ACT inhaler Inhale 2 puffs into the lungs 4 (four) times daily as needed. 05/15/15   Glean Hess, MD   Meds Ordered and Administered this Visit   Medications  ketorolac (TORADOL) injection 60 mg (not administered)    BP 150/102 mmHg  Pulse 117  Temp(Src) 98.7 F (37.1 C) (Tympanic)  Ht '5\' 6"'$  (1.676 m)  Wt 81 lb (36.741 kg)  BMI 13.08 kg/m2  SpO2 100% No data found.   Physical Exam  Constitutional: She appears well-developed and well-nourished. She is active.  Thin white female  HENT:  Head: Normocephalic. Not macrocephalic.  Right Ear: Hearing and tympanic membrane normal.  Left Ear: Hearing and tympanic membrane normal.  Eyes: Pupils are equal, round, and reactive to light.  Neck: Normal range of motion. Neck supple.  Musculoskeletal: She exhibits tenderness.       Left wrist: She exhibits tenderness, bony tenderness and swelling.       Arms: She has significant amount of tenderness and swelling over the ulnar side of the wrist and the hand there is minimal amount of tenderness over the radial area including the snuffbox area.  Neurological: She is alert.  Skin: Skin is warm.  Psychiatric: She has a normal mood and affect.  Vitals reviewed.   ED Course  Procedures (including critical care time)  Labs Review Labs Reviewed - No data to display  Imaging Review Dg Wrist Complete Left  10/27/2015  CLINICAL DATA:  Fall last night with left  wrist pain. EXAM: LEFT WRIST - COMPLETE 3+ VIEW COMPARISON:  None. FINDINGS: There is no evidence of fracture or dislocation. There is no evidence of arthropathy or other focal bone abnormality. The scaphoid is intact. Mild ulnar sided soft tissue edema. IMPRESSION: No fracture or subluxation of the left wrist. Mild ulnar-sided soft tissue edema. Electronically Signed   By: Jeb Levering M.D.   On: 10/27/2015 19:27     Visual Acuity Review  Right Eye Distance:   Left Eye Distance:   Bilateral Distance:    Right Eye Near:   Left Eye Near:    Bilateral Near:         MDM   1. Wrist contusion, left, initial encounter    Patient informed that the  wrist still bothers her after 1-2 weeks to please see her PCP for repeat x-ray. Most the tenderness is consistent with the x-rays with swelling is over the ulnar area of the wrist. Because of mild discomfort and pain she is in will give a 60 Toradol IM and feels better since when supported so we'll place on his left sling and also place a left wrist splint as well.  She states that the half tablet Vicodin she got from her daughter seemed to help with the pain will give her a prescription 10 shooting take 1/2-1 tablet every 6-8 hours on when necessary basis.    Frederich Cha, MD 10/27/15 2010

## 2015-10-27 NOTE — Discharge Instructions (Signed)
Contusion °A contusion is a deep bruise. Contusions happen when an injury causes bleeding under the skin. Symptoms of bruising include pain, swelling, and discolored skin. The skin may turn blue, purple, or yellow. °HOME CARE  °· Rest the injured area. °· If told, put ice on the injured area. °· Put ice in a plastic bag. °· Place a towel between your skin and the bag. °· Leave the ice on for 20 minutes, 2-3 times per day. °· If told, put light pressure (compression) on the injured area using an elastic bandage. Make sure the bandage is not too tight. Remove it and put it back on as told by your doctor. °· If possible, raise (elevate) the injured area above the level of your heart while you are sitting or lying down. °· Take over-the-counter and prescription medicines only as told by your doctor. °GET HELP IF: °· Your symptoms do not get better after several days of treatment. °· Your symptoms get worse. °· You have trouble moving the injured area. °GET HELP RIGHT AWAY IF:  °· You have very bad pain. °· You have a loss of feeling (numbness) in a hand or foot. °· Your hand or foot turns pale or cold. °  °This information is not intended to replace advice given to you by your health care provider. Make sure you discuss any questions you have with your health care provider. °  °Document Released: 09/14/2007 Document Revised: 12/17/2014 Document Reviewed: 08/13/2014 °Elsevier Interactive Patient Education ©2016 [employer] Inc[employer] ° °Cryotherapy °Cryotherapy is when you put ice on your injury. Ice helps lessen pain and puffiness (swelling) after an injury. Ice works the best when you start using it in the first 24 to 48 hours after an injury. °HOME CARE °· Put a dry or damp towel between the ice pack and your skin. °· You may press gently on the ice pack. °· Leave the ice on for no more than 10 to 20 minutes at a time. °· Check your skin after 5 minutes to make sure your skin is okay. °· Rest at least 20 minutes between ice  pack uses. °· Stop using ice when your skin loses feeling (numbness). °· Do not use ice on someone who cannot tell you when it hurts. This includes small children and people with memory problems (dementia). °GET HELP RIGHT AWAY IF: °· You have white spots on your skin. °· Your skin turns blue or pale. °· Your skin feels waxy or hard. °· Your puffiness gets worse. °MAKE SURE YOU:  °· Understand these instructions. °· Will watch your condition. °· Will get help right away if you are not doing well or get worse. °  °This information is not intended to replace advice given to you by your health care provider. Make sure you discuss any questions you have with your health care provider. °  °Document Released: 09/14/2007 Document Revised: 06/20/2011 Document Reviewed: 11/18/2010 °Elsevier Interactive Patient Education ©2016 Elsevier Inc. ° °

## 2015-10-27 NOTE — ED Notes (Signed)
Patient states she tripped over her niece last night and fell injuring her left wrist

## 2015-12-25 ENCOUNTER — Ambulatory Visit: Payer: Medicaid Other | Admitting: Internal Medicine

## 2015-12-28 ENCOUNTER — Ambulatory Visit: Payer: Medicaid Other | Admitting: Internal Medicine

## 2016-01-13 ENCOUNTER — Other Ambulatory Visit: Payer: Self-pay | Admitting: Internal Medicine

## 2016-01-25 ENCOUNTER — Encounter: Payer: Self-pay | Admitting: Internal Medicine

## 2016-01-25 ENCOUNTER — Ambulatory Visit (INDEPENDENT_AMBULATORY_CARE_PROVIDER_SITE_OTHER): Payer: Medicaid Other | Admitting: Internal Medicine

## 2016-01-25 VITALS — BP 118/80 | HR 115 | Temp 100.2°F | Resp 16 | Ht 66.0 in | Wt 84.0 lb

## 2016-01-25 DIAGNOSIS — J40 Bronchitis, not specified as acute or chronic: Secondary | ICD-10-CM

## 2016-01-25 DIAGNOSIS — J069 Acute upper respiratory infection, unspecified: Secondary | ICD-10-CM

## 2016-01-25 DIAGNOSIS — B9789 Other viral agents as the cause of diseases classified elsewhere: Secondary | ICD-10-CM

## 2016-01-25 LAB — POCT INFLUENZA A/B
INFLUENZA B, POC: NEGATIVE
Influenza A, POC: NEGATIVE

## 2016-01-25 MED ORDER — DOXYCYCLINE HYCLATE 100 MG PO TABS: 100.0000 mg | ORAL_TABLET | Freq: Two times a day (BID) | ORAL | 0 refills | Status: DC

## 2016-01-25 NOTE — Patient Instructions (Signed)

## 2016-01-25 NOTE — Progress Notes (Signed)
Date:  01/25/2016   Name:  Kendra Mullins   DOB:  May 02, 1960   MRN:  [id number]   Chief Complaint: Cough (Fever chillsn headache and cough since Wed. ) Cough  This is a new problem. The current episode started in the past 7 days. The problem has been waxing and waning. The cough is non-productive. Associated symptoms include chills, a fever, headaches, myalgias and a sore throat. Pertinent negatives include no chest pain, rash, shortness of breath or wheezing.  She has been taking tylenol intermittently for body aches as well.  She is drinking sufficient fluids.  No known exposure to the flu.    Review of Systems  Constitutional: Positive for chills, fatigue and fever.  HENT: Positive for congestion, sinus pressure and sore throat.   Eyes: Negative for visual disturbance.  Respiratory: Positive for cough and chest tightness. Negative for shortness of breath and wheezing.   Cardiovascular: Negative for chest pain, palpitations and leg swelling.  Gastrointestinal: Negative for abdominal pain, nausea and vomiting.  Genitourinary: Negative for dysuria.  Musculoskeletal: Positive for myalgias.  Skin: Negative for rash.  Neurological: Positive for dizziness, weakness and headaches.    Patient Active Problem List   Diagnosis Date Noted  . Continuous chronic alcoholism ([place]) 09/15/2014  . Ankle inflammation 09/15/2014  . Anxiety 09/15/2014  . Cough, persistent 09/15/2014  . Smokes tobacco daily 09/15/2014  . Essential (primary) hypertension 09/15/2014  . Arthralgia of hand 09/15/2014  . Hot flash, menopausal 09/15/2014  . MI (mitral incompetence) 09/15/2014  . Chronic obstructive pulmonary emphysema ([place]) 09/15/2014  . Low weight 09/15/2014  . Abdominal pain 11/02/2011  . Diarrhea, unspecified 10/15/2011  . Sepsis(995.91) 10/15/2011    Prior to Admission medications   Medication Sig Start Date End Date Taking? Authorizing Provider  ALPRAZolam Duanne Moron) 0.5 MG tablet Take 0.5  mg by mouth 2 (two) times daily as needed for anxiety.   Yes Historical Provider, MD  benazepril (LOTENSIN) 5 MG tablet Take 1 tablet (5 mg total) by mouth daily. 05/15/15  Yes Glean Hess, MD  CARDIZEM LA 240 MG 24 hr tablet TAKE 1 TABLET BY MOUTH ONCE DAILY 01/13/16  Yes Glean Hess, MD  fluticasone [facility]) 50 MCG/ACT nasal spray Place 2 sprays into both nostrils daily as needed for rhinitis.   Yes Historical Provider, MD  Fluticasone-Salmeterol (ADVAIR) 100-50 MCG/DOSE AEPB Inhale 1 puff into the lungs 2 (two) times daily. 01/20/15  Yes Glean Hess, MD  ipratropium (ATROVENT HFA) 17 MCG/ACT inhaler Inhale 2 puffs into the lungs every 4 (four) hours as needed for wheezing. 12/13/14  Yes Sherlene Shams, MD  omeprazole (PRILOSEC) 40 MG capsule Take 1 capsule (40 mg total) by mouth daily. 10/07/15  Yes Glean Hess, MD  phenazopyridine (PYRIDIUM) 95 MG tablet Take 1 tablet (95 mg total) by mouth 3 (three) times daily as needed for pain. 10/07/15  Yes Glean Hess, MD  PROVENTIL HFA 108 [phone] Base) MCG/ACT inhaler Inhale 2 puffs into the lungs 4 (four) times daily as needed. 05/15/15  Yes Glean Hess, MD    Allergies  Allergen Reactions  . Azithromycin Nausea Only  . Codeine Nausea And Vomiting    Past Surgical History:  Procedure Laterality Date  . ABDOMINAL HYSTERECTOMY  1986    Social History  Substance Use Topics  . Smoking status: Current Every Day Smoker    Packs/day: 1.00    Types: Cigarettes  . Smokeless tobacco: Never Used  . Alcohol  use 0.0 oz/week     Comment: 6-7 each day     Medication list has been reviewed and updated.   Physical Exam  Constitutional: She is oriented to person, place, and time. She appears well-developed. She has a sickly appearance. No distress.  HENT:  Head: Normocephalic and atraumatic.  Right Ear: Tympanic membrane and ear canal normal.  Left Ear: Tympanic membrane and ear canal normal.  Nose: Right sinus exhibits no  maxillary sinus tenderness. Left sinus exhibits no maxillary sinus tenderness.  Mouth/Throat: Posterior oropharyngeal erythema present.  Neck: Normal range of motion. Neck supple. Carotid bruit is not present.  Cardiovascular: Regular rhythm and normal heart sounds.  Tachycardia present.   Pulmonary/Chest: Effort normal. No respiratory distress. She has decreased breath sounds. She has no wheezes. She has no rhonchi.  Musculoskeletal: Normal range of motion.  Neurological: She is alert and oriented to person, place, and time.  Skin: Skin is warm and dry. No rash noted.  Psychiatric: She has a normal mood and affect. Her behavior is normal. Thought content normal.  Nursing note and vitals reviewed.   BP 118/80   Pulse (!) 115   Temp 100.2 F (37.9 C) (Oral)   Resp 16   Ht '5\' 6"'$  (1.676 m)   Wt 84 lb (38.1 kg)   SpO2 98%   BMI 13.56 kg/m   Assessment and Plan: 1. Viral URI Supportive care Avoid contact with others until fever resolved - POCT Influenza A/B  2. Bronchitis Use Delsym otc as needed - doxycycline (VIBRA-TABS) 100 MG tablet; Take 1 tablet (100 mg total) by mouth 2 (two) times daily.  Dispense: 20 tablet; Refill: 0   Halina Maidens, MD [facility]  01/25/2016

## 2016-02-03 ENCOUNTER — Other Ambulatory Visit: Payer: Self-pay | Admitting: Internal Medicine

## 2016-02-03 ENCOUNTER — Telehealth: Payer: Self-pay

## 2016-02-03 DIAGNOSIS — R05 Cough: Secondary | ICD-10-CM

## 2016-02-03 DIAGNOSIS — R053 Chronic cough: Secondary | ICD-10-CM

## 2016-02-03 MED ORDER — CEFDINIR 300 MG PO CAPS: 300.0000 mg | ORAL_CAPSULE | Freq: Two times a day (BID) | ORAL | 0 refills | Status: DC

## 2016-02-03 NOTE — Telephone Encounter (Signed)
Send a different antibiotic to pharmacy.

## 2016-02-03 NOTE — Telephone Encounter (Signed)
Wants another round of Abx

## 2016-02-19 ENCOUNTER — Ambulatory Visit: Payer: Medicaid Other | Admitting: Internal Medicine

## 2016-03-01 ENCOUNTER — Ambulatory Visit
Admission: RE | Admit: 2016-03-01 | Discharge: 2016-03-01 | Disposition: A | Discharge status: Home / Self Care | Payer: Medicaid Other | Source: Ambulatory Visit | Attending: Internal Medicine | Admitting: Internal Medicine

## 2016-03-01 ENCOUNTER — Ambulatory Visit (INDEPENDENT_AMBULATORY_CARE_PROVIDER_SITE_OTHER): Payer: Medicaid Other | Admitting: Internal Medicine

## 2016-03-01 ENCOUNTER — Encounter: Payer: Self-pay | Admitting: Internal Medicine

## 2016-03-01 VITALS — BP 130/80 | HR 96 | Wt 84.0 lb

## 2016-03-01 DIAGNOSIS — J984 Other disorders of lung: Secondary | ICD-10-CM | POA: Insufficient documentation

## 2016-03-01 DIAGNOSIS — J40 Bronchitis, not specified as acute or chronic: Secondary | ICD-10-CM | POA: Insufficient documentation

## 2016-03-01 DIAGNOSIS — J449 Chronic obstructive pulmonary disease, unspecified: Secondary | ICD-10-CM | POA: Insufficient documentation

## 2016-03-01 DIAGNOSIS — R05 Cough: Secondary | ICD-10-CM | POA: Insufficient documentation

## 2016-03-01 MED ORDER — LEVOFLOXACIN 500 MG PO TABS: 500.0000 mg | ORAL_TABLET | Freq: Every day | ORAL | 0 refills | Status: DC

## 2016-03-01 NOTE — Patient Instructions (Signed)
Mucinex (plain) will help thin the mucus

## 2016-03-01 NOTE — Progress Notes (Signed)
Date:  03/01/2016   Name:  Kendra Mullins   DOB:  1961/01/09   MRN:  [id number]   Chief Complaint: Cough and Diarrhea Cough  This is a recurrent problem. The problem has been gradually worsening. The problem occurs hourly. The cough is productive of sputum. Associated symptoms include chills, a fever, headaches, shortness of breath and wheezing. Pertinent negatives include no chest pain or myalgias. The treatment provided mild (only temporary improvement after Abtx) relief.  Took Doxycycline then Cefdinir.  Starting to cough up thick mucus again.  Diarrhea was initially watery, now improving.  Eating fairly well.  Drinking fluids.    Review of Systems  Constitutional: Positive for chills, fatigue and fever.  Eyes: Negative for visual disturbance.  Respiratory: Positive for cough, shortness of breath and wheezing.   Cardiovascular: Negative for chest pain and palpitations.  Gastrointestinal: Positive for diarrhea.  Musculoskeletal: Negative for arthralgias and myalgias.  Neurological: Positive for weakness and headaches.    Patient Active Problem List   Diagnosis Date Noted  . Continuous chronic alcoholism ([place]) 09/15/2014  . Ankle inflammation 09/15/2014  . Anxiety 09/15/2014  . Cough, persistent 09/15/2014  . Smokes tobacco daily 09/15/2014  . Essential (primary) hypertension 09/15/2014  . Arthralgia of hand 09/15/2014  . Hot flash, menopausal 09/15/2014  . MI (mitral incompetence) 09/15/2014  . Chronic obstructive pulmonary emphysema ([place]) 09/15/2014  . Low weight 09/15/2014  . Abdominal pain 11/02/2011  . Diarrhea, unspecified 10/15/2011  . Sepsis(995.91) 10/15/2011    Prior to Admission medications   Medication Sig Start Date End Date Taking? Authorizing Provider  ALPRAZolam Duanne Moron) 0.5 MG tablet Take 0.5 mg by mouth 2 (two) times daily as needed for anxiety.   Yes Historical Provider, MD  benazepril (LOTENSIN) 5 MG tablet Take 1 tablet (5 mg total) by mouth  daily. 05/15/15  Yes Glean Hess, MD  CARDIZEM LA 240 MG 24 hr tablet TAKE 1 TABLET BY MOUTH ONCE DAILY 01/13/16  Yes Glean Hess, MD  cefdinir (OMNICEF) 300 MG capsule Take 1 capsule (300 mg total) by mouth 2 (two) times daily. 02/03/16  Yes Glean Hess, MD  Fluticasone-Salmeterol (ADVAIR) 100-50 MCG/DOSE AEPB Inhale 1 puff into the lungs 2 (two) times daily. 01/20/15  Yes Glean Hess, MD  ipratropium (ATROVENT HFA) 17 MCG/ACT inhaler Inhale 2 puffs into the lungs every 4 (four) hours as needed for wheezing. 12/13/14  Yes Sherlene Shams, MD  PROVENTIL HFA 108 [phone] Base) MCG/ACT inhaler Inhale 2 puffs into the lungs 4 (four) times daily as needed. 05/15/15  Yes Glean Hess, MD  fluticasone (FLONASE) 50 MCG/ACT nasal spray Place 2 sprays into both nostrils daily as needed for rhinitis.    Historical Provider, MD  omeprazole (PRILOSEC) 40 MG capsule Take 1 capsule (40 mg total) by mouth daily. Patient not taking: Reported on 03/01/2016 10/07/15   Glean Hess, MD  phenazopyridine (PYRIDIUM) 95 MG tablet Take 1 tablet (95 mg total) by mouth 3 (three) times daily as needed for pain. Patient not taking: Reported on 03/01/2016 10/07/15   Glean Hess, MD    Allergies  Allergen Reactions  . Azithromycin Nausea Only  . Codeine Nausea And Vomiting    Past Surgical History:  Procedure Laterality Date  . ABDOMINAL HYSTERECTOMY  1986    Social History  Substance Use Topics  . Smoking status: Current Every Day Smoker    Packs/day: 1.00    Types: Cigarettes  . Smokeless tobacco: Never  Used  . Alcohol use 0.0 oz/week     Comment: 6-7 each day     Medication list has been reviewed and updated.   Physical Exam  Constitutional: Vital signs are normal. She appears cachectic. She appears ill.  HENT:  Right Ear: Tympanic membrane and ear canal normal.  Left Ear: Tympanic membrane and ear canal normal.  Nose: Right sinus exhibits no maxillary sinus tenderness. Left sinus  exhibits no maxillary sinus tenderness.  Mouth/Throat: No posterior oropharyngeal edema or posterior oropharyngeal erythema.  Cardiovascular: Normal rate and regular rhythm.   Pulmonary/Chest: She has decreased breath sounds. She has no wheezes. She has no rhonchi.  Neurological: She is alert.    BP 130/80   Pulse 96   Wt 84 lb (38.1 kg)   SpO2 99%   BMI 13.56 kg/m   Assessment and Plan: 1. Bronchitis Begin mucinex, increase fluids - CBC with Differential/Platelet - Basic metabolic panel - DG Chest 2 View; Future - levofloxacin (LEVAQUIN) 500 MG tablet; Take 1 tablet (500 mg total) by mouth daily.  Dispense: 10 tablet; Refill: 0   Halina Maidens, MD [facility]  03/01/2016

## 2016-03-02 LAB — BASIC METABOLIC PANEL
BUN/Creatinine Ratio: 12 (ref 9–23)
BUN: 8 mg/dL (ref 6–24)
CALCIUM: 9.8 mg/dL (ref 8.7–10.2)
CHLORIDE: 96 mmol/L (ref 96–106)
CO2: 26 mmol/L (ref 18–29)
Creatinine, Ser: 0.68 mg/dL (ref 0.57–1.00)
GFR calc non Af Amer: 99 mL/min/{1.73_m2} (ref >59)
GFR, EST AFRICAN AMERICAN: 114 mL/min/{1.73_m2} (ref >59)
GLUCOSE: 144 mg/dL — AB (ref 65–99)
Potassium: 4.5 mmol/L (ref 3.5–5.2)
Sodium: 139 mmol/L (ref 134–144)

## 2016-03-02 LAB — CBC WITH DIFFERENTIAL/PLATELET
BASOS: 0 %
Basophils Absolute: 0 10*3/uL (ref 0.0–0.2)
EOS (ABSOLUTE): 0.1 10*3/uL (ref 0.0–0.4)
Eos: 1 %
Hematocrit: 40.8 % (ref 34.0–46.6)
Hemoglobin: 14.4 g/dL (ref 11.1–15.9)
IMMATURE GRANS (ABS): 0 10*3/uL (ref 0.0–0.1)
IMMATURE GRANULOCYTES: 0 %
LYMPHS: 24 %
Lymphocytes Absolute: 2.2 10*3/uL (ref 0.7–3.1)
MCH: 33.3 pg — ABNORMAL HIGH (ref 26.6–33.0)
MCHC: 35.3 g/dL (ref 31.5–35.7)
MCV: 94 fL (ref 79–97)
MONOCYTES: 13 %
Monocytes Absolute: 1.2 10*3/uL — ABNORMAL HIGH (ref 0.1–0.9)
NEUTROS PCT: 62 %
Neutrophils Absolute: 5.6 10*3/uL (ref 1.4–7.0)
PLATELETS: 266 10*3/uL (ref 150–379)
RBC: 4.32 x10E6/uL (ref 3.77–5.28)
RDW: 13.2 % (ref 12.3–15.4)
WBC: 9.1 10*3/uL (ref 3.4–10.8)

## 2016-04-13 ENCOUNTER — Other Ambulatory Visit: Payer: Self-pay | Admitting: Internal Medicine

## 2016-04-13 DIAGNOSIS — J438 Other emphysema: Secondary | ICD-10-CM

## 2016-04-26 ENCOUNTER — Ambulatory Visit (INDEPENDENT_AMBULATORY_CARE_PROVIDER_SITE_OTHER): Payer: Medicaid Other

## 2016-04-26 DIAGNOSIS — Z23 Encounter for immunization: Secondary | ICD-10-CM | POA: Diagnosis not present

## 2016-05-15 ENCOUNTER — Other Ambulatory Visit: Payer: Self-pay | Admitting: Internal Medicine

## 2016-05-15 DIAGNOSIS — I10 Essential (primary) hypertension: Secondary | ICD-10-CM

## 2016-05-20 ENCOUNTER — Telehealth: Payer: Self-pay

## 2016-05-20 NOTE — Telephone Encounter (Signed)
Left message stating below.

## 2016-05-20 NOTE — Telephone Encounter (Signed)
She can use 2 drops through the weekend but if not improving, needs to see Eye MD.

## 2016-05-20 NOTE — Telephone Encounter (Signed)
Patient called saying that she has dry eyes. She is taking OTC Soothe XP Advanced dry eye therapy. She is wanting to know can she have more than 1 drop in each eye? On the box it says 1 drop only unless authorized by physician.

## 2016-05-30 ENCOUNTER — Telehealth: Payer: Self-pay

## 2016-05-30 NOTE — Telephone Encounter (Signed)
Patches are much safer than smoking.  Go ahead and use them - will probably have to get them over the counter.

## 2016-05-30 NOTE — Telephone Encounter (Signed)
Pt called saying she wants to quit smoking and would like to try patches to quit. She is going to call her insurance to find out if/what they will approve. She wants to know if they are safe for her to take? --- Awaiting pt's call.

## 2016-05-30 NOTE — Telephone Encounter (Signed)
Awaiting pt's call back about insurance company.

## 2016-07-11 ENCOUNTER — Other Ambulatory Visit: Payer: Self-pay | Admitting: Internal Medicine

## 2016-10-12 ENCOUNTER — Other Ambulatory Visit: Payer: Self-pay | Admitting: Internal Medicine

## 2016-10-12 DIAGNOSIS — J438 Other emphysema: Secondary | ICD-10-CM

## 2016-10-31 ENCOUNTER — Other Ambulatory Visit: Payer: Self-pay | Admitting: Internal Medicine

## 2016-10-31 DIAGNOSIS — I10 Essential (primary) hypertension: Secondary | ICD-10-CM

## 2016-11-01 ENCOUNTER — Telehealth: Payer: Self-pay

## 2016-11-01 NOTE — Telephone Encounter (Signed)
Called and left a message.

## 2016-11-01 NOTE — Telephone Encounter (Signed)
Pt called stating she was returning your call. Please Advise and call back- [phone]

## 2016-11-01 NOTE — Telephone Encounter (Signed)
Pt will call back 2nd week of august to set up appointment.

## 2016-11-01 NOTE — Telephone Encounter (Signed)
See note

## 2016-11-02 NOTE — Telephone Encounter (Signed)
Spoke to pt yesterday going out of town will call back to reschedule.

## 2016-11-21 ENCOUNTER — Ambulatory Visit (INDEPENDENT_AMBULATORY_CARE_PROVIDER_SITE_OTHER): Payer: Medicaid Other | Admitting: Internal Medicine

## 2016-11-21 ENCOUNTER — Encounter: Payer: Self-pay | Admitting: Internal Medicine

## 2016-11-21 VITALS — BP 142/80 | HR 105 | Ht 66.0 in | Wt 81.6 lb

## 2016-11-21 DIAGNOSIS — J438 Other emphysema: Secondary | ICD-10-CM

## 2016-11-21 DIAGNOSIS — I10 Essential (primary) hypertension: Secondary | ICD-10-CM

## 2016-11-21 DIAGNOSIS — N3000 Acute cystitis without hematuria: Secondary | ICD-10-CM | POA: Diagnosis not present

## 2016-11-21 LAB — POC URINALYSIS WITH MICROSCOPIC (NON AUTO)MANUAL RESULT
BILIRUBIN UA: NEGATIVE
Blood, UA: NEGATIVE
Crystals: 0
Glucose, UA: NEGATIVE
KETONES UA: NEGATIVE
MUCUS UA: 0
NITRITE UA: POSITIVE
Protein, UA: NEGATIVE
RBC: 0 M/uL — AB (ref 4.04–5.48)
SPEC GRAV UA: 1.01 (ref 1.010–1.025)
UROBILINOGEN UA: 1 U/dL
WBC CASTS UA: 5
pH, UA: 7.5 (ref 5.0–8.0)

## 2016-11-21 MED ORDER — BENAZEPRIL HCL 5 MG PO TABS: 5.0000 mg | ORAL_TABLET | Freq: Every day | ORAL | 5 refills | Status: DC

## 2016-11-21 MED ORDER — PROAIR HFA 108 (90 BASE) MCG/ACT IN AERS: 2.0000 | INHALATION_SPRAY | Freq: Four times a day (QID) | RESPIRATORY_TRACT | 5 refills | Status: DC | PRN

## 2016-11-21 MED ORDER — CIPROFLOXACIN HCL 250 MG PO TABS: 250.0000 mg | ORAL_TABLET | Freq: Two times a day (BID) | ORAL | 0 refills | Status: AC

## 2016-11-21 NOTE — Progress Notes (Signed)
Date:  11/21/2016   Name:  Kendra Mullins   DOB:  Jan 11, 1961   MRN:  [id number]   Chief Complaint: Urinary Tract Infection (Started Friday. Pelvic pressure, pain, and frequent urinating. Burning. If she does not run to bathroom she will have a accident. ) Urinary Tract Infection   This is a new problem. The current episode started in the past 7 days. The problem occurs every urination. The problem has been unchanged. The quality of the pain is described as burning. The pain is mild. There has been no fever. Associated symptoms include frequency and urgency. Pertinent negatives include no chills. She has tried increased fluids for the symptoms. The treatment provided no relief.  Hypertension  This is a chronic problem. The problem is controlled. Associated symptoms include shortness of breath. Pertinent negatives include no chest pain or palpitations. Past treatments include calcium channel blockers and ACE inhibitors. The current treatment provides significant improvement.  COPD - continues to smoke 1 ppd.  Using Advair, Proair and Atrovent as needed only.  No recent chest infections, chronic sputum or cough.  Does feel SOB, worse with exertion.    Review of Systems  Constitutional: Negative for chills, fatigue and fever.  Respiratory: Positive for shortness of breath. Negative for cough, choking, chest tightness and wheezing.   Cardiovascular: Negative for chest pain and palpitations.  Gastrointestinal: Negative for abdominal pain.  Endocrine: Negative for polydipsia and polyuria.  Genitourinary: Positive for dysuria, frequency and urgency. Negative for difficulty urinating.  Musculoskeletal: Negative for arthralgias and gait problem.  Psychiatric/Behavioral: Negative for sleep disturbance.    Patient Active Problem List   Diagnosis Date Noted  . Continuous chronic alcoholism ([place]) 09/15/2014  . Ankle inflammation 09/15/2014  . Anxiety 09/15/2014  . Cough, persistent 09/15/2014    . Smokes tobacco daily 09/15/2014  . Essential (primary) hypertension 09/15/2014  . Arthralgia of hand 09/15/2014  . Hot flash, menopausal 09/15/2014  . MI (mitral incompetence) 09/15/2014  . Chronic obstructive pulmonary emphysema ([place]) 09/15/2014  . Low weight 09/15/2014  . Abdominal pain 11/02/2011  . Diarrhea, unspecified 10/15/2011  . Sepsis(995.91) 10/15/2011    Prior to Admission medications   Medication Sig Start Date End Date Taking? Authorizing Provider  ALPRAZolam Duanne Moron) 0.5 MG tablet Take 0.5 mg by mouth 2 (two) times daily as needed for anxiety.   Yes [provider]  ATROVENT HFA 17 MCG/ACT inhaler INHALE 2 PUFFS INTO THE LUNGS EVERY 4 HOURS AS NEEDED FOR WHEEZING 11/01/16  Yes Glean Hess, MD  benazepril (LOTENSIN) 5 MG tablet TAKE 1 TABLET BY MOUTH EVERY DAY 11/01/16  Yes Glean Hess, MD  CARDIZEM LA 240 MG 24 hr tablet TAKE 1 TABLET BY MOUTH ONCE DAILY 07/11/16  Yes Glean Hess, MD  fluticasone [facility]) 50 MCG/ACT nasal spray Place 2 sprays into both nostrils daily as needed for rhinitis.   Yes [provider]  Fluticasone-Salmeterol (ADVAIR) 100-50 MCG/DOSE AEPB Inhale 1 puff into the lungs 2 (two) times daily. 01/20/15  Yes Glean Hess, MD  PROAIR HFA 108 [phone] Base) MCG/ACT inhaler INHALE 2 PUFFS INTO THE LUNGS FOUR TIMES DAILY AS NEEDED 10/13/16  Yes Glean Hess, MD  omeprazole (PRILOSEC) 40 MG capsule Take 1 capsule (40 mg total) by mouth daily. Patient not taking: Reported on 11/21/2016 10/07/15   Glean Hess, MD    Allergies  Allergen Reactions  . Azithromycin Nausea Only  . Codeine Nausea And Vomiting    Past Surgical History:  Procedure Laterality Date  . ABDOMINAL HYSTERECTOMY  1986    Social History  Substance Use Topics  . Smoking status: Current Every Day Smoker    Packs/day: 1.00    Types: Cigarettes  . Smokeless tobacco: Never Used  . Alcohol use 0.0 oz/week     Comment: 6-7 each day      Medication list has been reviewed and updated.   Physical Exam  Constitutional: Vital signs are normal. She appears cachectic.  Cardiovascular: Normal rate, regular rhythm and normal heart sounds.   Pulmonary/Chest: Effort normal. No respiratory distress. She has decreased breath sounds. She has no wheezes. She has no rhonchi.  Abdominal: Soft. Bowel sounds are normal. There is tenderness in the suprapubic area. There is no rigidity, no rebound, no guarding and no CVA tenderness.  Psychiatric: She has a normal mood and affect.  Nursing note and vitals reviewed.   BP (!) 142/80   Pulse (!) 105   Ht 5\' 6"  (1.676 m)   Wt 81 lb 9.6 oz (37 kg)   SpO2 98%   BMI 13.17 kg/m   Assessment and Plan: 1. Acute cystitis without hematuria Increase fluids - POC urinalysis w microscopic (non auto) - ciprofloxacin (CIPRO) 250 MG tablet; Take 1 tablet (250 mg total) by mouth 2 (two) times daily.  Dispense: 14 tablet; Refill: 0  2. Essential (primary) hypertension controlled - benazepril (LOTENSIN) 5 MG tablet; Take 1 tablet (5 mg total) by mouth daily.  Dispense: 30 tablet; Refill: 5 - CBC with Differential/Platelet - Comprehensive metabolic panel - TSH  3. Other emphysema ([place]) Continue inhalers and efforts at smoking cessation - PROAIR HFA 108 (90 Base) MCG/ACT inhaler; Inhale 2 puffs into the lungs every 6 (six) hours as needed for wheezing or shortness of breath.  Dispense: 6.7 g; Refill: 5   Meds ordered this encounter  Medications  . benazepril (LOTENSIN) 5 MG tablet    Sig: Take 1 tablet (5 mg total) by mouth daily.    Dispense:  30 tablet    Refill:  5  . PROAIR HFA 108 (90 Base) MCG/ACT inhaler    Sig: Inhale 2 puffs into the lungs every 6 (six) hours as needed for wheezing or shortness of breath.    Dispense:  6.7 g    Refill:  5  . ciprofloxacin (CIPRO) 250 MG tablet    Sig: Take 1 tablet (250 mg total) by mouth 2 (two) times daily.    Dispense:  14 tablet     Refill:  0    Halina Maidens, MD [facility]  11/21/2016

## 2016-11-22 LAB — CBC WITH DIFFERENTIAL/PLATELET
BASOS: 1 %
Basophils Absolute: 0.1 10*3/uL (ref 0.0–0.2)
EOS (ABSOLUTE): 0.1 10*3/uL (ref 0.0–0.4)
EOS: 2 %
HEMATOCRIT: 45.7 % (ref 34.0–46.6)
HEMOGLOBIN: 15.6 g/dL (ref 11.1–15.9)
IMMATURE GRANS (ABS): 0 10*3/uL (ref 0.0–0.1)
IMMATURE GRANULOCYTES: 0 %
LYMPHS: 31 %
Lymphocytes Absolute: 2.4 10*3/uL (ref 0.7–3.1)
MCH: 33.1 pg — ABNORMAL HIGH (ref 26.6–33.0)
MCHC: 34.1 g/dL (ref 31.5–35.7)
MCV: 97 fL (ref 79–97)
MONOCYTES: 13 %
Monocytes Absolute: 1 10*3/uL — ABNORMAL HIGH (ref 0.1–0.9)
NEUTROS ABS: 4.1 10*3/uL (ref 1.4–7.0)
NEUTROS PCT: 53 %
PLATELETS: 289 10*3/uL (ref 150–379)
RBC: 4.71 x10E6/uL (ref 3.77–5.28)
RDW: 12.6 % (ref 12.3–15.4)
WBC: 7.7 10*3/uL (ref 3.4–10.8)

## 2016-11-22 LAB — COMPREHENSIVE METABOLIC PANEL
A/G RATIO: 1.8 (ref 1.2–2.2)
ALBUMIN: 4.9 g/dL (ref 3.5–5.5)
ALT: 17 IU/L (ref 0–32)
AST: 23 IU/L (ref 0–40)
Alkaline Phosphatase: 136 IU/L — ABNORMAL HIGH (ref 39–117)
BUN/Creatinine Ratio: 11 (ref 9–23)
BUN: 8 mg/dL (ref 6–24)
Bilirubin Total: 0.6 mg/dL (ref 0.0–1.2)
CALCIUM: 9.7 mg/dL (ref 8.7–10.2)
CHLORIDE: 101 mmol/L (ref 96–106)
CO2: 25 mmol/L (ref 20–29)
Creatinine, Ser: 0.71 mg/dL (ref 0.57–1.00)
GFR calc non Af Amer: 96 mL/min/{1.73_m2} (ref >59)
GFR, EST AFRICAN AMERICAN: 110 mL/min/{1.73_m2} (ref >59)
Globulin, Total: 2.8 g/dL (ref 1.5–4.5)
Glucose: 71 mg/dL (ref 65–99)
Potassium: 4.5 mmol/L (ref 3.5–5.2)
Sodium: 139 mmol/L (ref 134–144)
TOTAL PROTEIN: 7.7 g/dL (ref 6.0–8.5)

## 2016-11-22 LAB — TSH: TSH: 1.28 u[IU]/mL (ref 0.450–4.500)

## 2016-11-23 ENCOUNTER — Telehealth: Payer: Self-pay

## 2016-11-23 NOTE — Telephone Encounter (Signed)
Patient called stating she has basd arthritis in hands. She is noticing her hands and finger tips turning in. She is wonder if she could get a referral to have this checked out. She worries its progressing too fast. Please Advise.

## 2016-11-24 NOTE — Telephone Encounter (Signed)
Advised on detailed message

## 2016-11-24 NOTE — Telephone Encounter (Signed)
It is most likely osteoarthritis which is just treated with tylenol or aleve as needed.  There is no other specific treatment or means of reversal of damage.  If she thinks it is some other arthritis like RA, she will need to see me first before being referred.

## 2017-01-11 ENCOUNTER — Other Ambulatory Visit: Payer: Self-pay | Admitting: Internal Medicine

## 2017-01-13 ENCOUNTER — Telehealth: Payer: Self-pay

## 2017-01-13 NOTE — Telephone Encounter (Signed)
Called for PA for cardizem LA medication for patient. Insurance gave me PA# [id number]. Told I can call back and check on approved or denied. The preferred meds for her insurance is diltiazem, diltiazem ER, and verapamil or verapamil ER.

## 2017-01-16 ENCOUNTER — Telehealth: Payer: Self-pay

## 2017-01-16 ENCOUNTER — Other Ambulatory Visit: Payer: Self-pay

## 2017-01-16 MED ORDER — DILTIAZEM HCL ER 240 MG PO CP24: 240.0000 mg | ORAL_CAPSULE | Freq: Every day | ORAL | 5 refills | Status: DC

## 2017-01-16 NOTE — Telephone Encounter (Signed)
Patient called stating she has been out of Cardizem medication all weekend. Noticing a difference in the way she feels. Still has not received information for PA sent on Friday.  Spoke with patient informed sent in equivalent medication to pharmacy instead. Diltiazem ER.  Patient verbalized understanding.

## 2017-02-08 ENCOUNTER — Other Ambulatory Visit: Payer: Self-pay | Admitting: Internal Medicine

## 2017-03-09 ENCOUNTER — Encounter: Payer: Self-pay | Admitting: Internal Medicine

## 2017-03-09 ENCOUNTER — Ambulatory Visit (INDEPENDENT_AMBULATORY_CARE_PROVIDER_SITE_OTHER): Payer: Medicaid Other | Admitting: Internal Medicine

## 2017-03-09 VITALS — BP 140/78 | HR 95 | Ht 61.0 in | Wt 86.8 lb

## 2017-03-09 DIAGNOSIS — H04203 Unspecified epiphora, bilateral lacrimal glands: Secondary | ICD-10-CM | POA: Diagnosis not present

## 2017-03-09 DIAGNOSIS — M25542 Pain in joints of left hand: Secondary | ICD-10-CM

## 2017-03-09 DIAGNOSIS — M25541 Pain in joints of right hand: Secondary | ICD-10-CM | POA: Diagnosis not present

## 2017-03-09 DIAGNOSIS — Z23 Encounter for immunization: Secondary | ICD-10-CM | POA: Diagnosis not present

## 2017-03-09 NOTE — Patient Instructions (Signed)
Limit Tylenol to 2,000 mg per day

## 2017-03-09 NOTE — Progress Notes (Signed)
Date:  03/09/2017   Name:  Kendra Mullins   DOB:  1960-10-10   MRN:  [id number]   Chief Complaint: Cyst (Found a knot on the back of neck- noticed it a month ago but hasn't bother her. Past week its started to hurt when laying on her right side. ); Allergic Rhinitis  (Eyes have been watering in the past week and feel very dry. Using otc drops to help. ); and Arthritis (Wants to get referral for arthiritis doctor because hands are getting worse. Painful and feels like they are turning in. )  Arthritis  Presents for follow-up visit. She complains of pain, stiffness and joint swelling. The symptoms have been worsening. Affected locations include the right MCP and left MCP. Pertinent negatives include no fatigue or fever.   KNot - on back of head, started about a month ago.  Not enlarging or draining.  Watery eyes - for some time - at least since the summer.  Mild itching at times.  No change in sx with various locations.  Review of Systems  Constitutional: Negative for chills, fatigue and fever.  Eyes: Positive for discharge and redness. Negative for visual disturbance.  Respiratory: Negative for chest tightness, shortness of breath and wheezing.   Cardiovascular: Negative for chest pain and palpitations.  Musculoskeletal: Positive for arthralgias, arthritis, joint swelling and stiffness.  Allergic/Immunologic: Positive for environmental allergies.  Neurological: Negative for dizziness and headaches.    Patient Active Problem List   Diagnosis Date Noted  . Continuous chronic alcoholism ([place]) 09/15/2014  . Ankle inflammation 09/15/2014  . Anxiety 09/15/2014  . Cough, persistent 09/15/2014  . Smokes tobacco daily 09/15/2014  . Essential (primary) hypertension 09/15/2014  . Arthralgia of hand 09/15/2014  . Hot flash, menopausal 09/15/2014  . MI (mitral incompetence) 09/15/2014  . Chronic obstructive pulmonary emphysema ([place]) 09/15/2014  . Low weight 09/15/2014  . Abdominal pain  11/02/2011  . Diarrhea, unspecified 10/15/2011  . Sepsis(995.91) 10/15/2011    Prior to Admission medications   Medication Sig Start Date End Date Taking? Authorizing Provider  ALPRAZolam Duanne Moron) 0.5 MG tablet Take 0.5 mg by mouth 2 (two) times daily as needed for anxiety.   Yes [provider]  ATROVENT HFA 17 MCG/ACT inhaler INHALE 2 PUFFS BY MOUTH EVERY 4 HOURS AS NEEDED FOR WHEEZING 01/11/17  Yes Glean Hess, MD  benazepril (LOTENSIN) 5 MG tablet Take 1 tablet (5 mg total) by mouth daily. 11/21/16  Yes Glean Hess, MD  diltiazem (DILACOR XR) 240 MG 24 hr capsule Take 1 capsule (240 mg total) by mouth daily. 01/16/17  Yes Glean Hess, MD  fluticasone [facility]) 50 MCG/ACT nasal spray USE 2 SPRAYS IN [facility] NOSTRIL DAILY 02/09/17  Yes Glean Hess, MD  Fluticasone-Salmeterol (ADVAIR) 100-50 MCG/DOSE AEPB Inhale 1 puff into the lungs 2 (two) times daily. 01/20/15  Yes Glean Hess, MD  omeprazole (PRILOSEC) 40 MG capsule Take 1 capsule (40 mg total) by mouth daily. 10/07/15  Yes Glean Hess, MD  PROAIR HFA 108 [phone] Base) MCG/ACT inhaler Inhale 2 puffs into the lungs every 6 (six) hours as needed for wheezing or shortness of breath. 11/21/16  Yes Glean Hess, MD    Allergies  Allergen Reactions  . Azithromycin Nausea Only  . Codeine Nausea And Vomiting    Past Surgical History:  Procedure Laterality Date  . ABDOMINAL HYSTERECTOMY  1986    Social History   Tobacco Use  . Smoking status: Current Every  Day Smoker    Packs/day: 1.00    Types: Cigarettes  . Smokeless tobacco: Never Used  Substance Use Topics  . Alcohol use: Yes    Alcohol/week: 0.0 oz    Comment: 6-7 each day  . Drug use: No     Medication list has been reviewed and updated.  PHQ 2/9 Scores 11/21/2016 02/13/2015  PHQ - 2 Score 1 0    Physical Exam  Constitutional: She is oriented to person, place, and time. She appears well-developed. No distress.  HENT:  Head:  Normocephalic and atraumatic.  Eyes: Conjunctivae and EOM are normal. Pupils are equal, round, and reactive to light. Right eye exhibits no chemosis and no exudate. Left eye exhibits no chemosis and no exudate. No scleral icterus.  Excess tears noted bilaterally  Neck: Normal range of motion. No thyromegaly present.  Cardiovascular: Normal rate, regular rhythm and normal heart sounds.  Pulmonary/Chest: Effort normal and breath sounds normal. No respiratory distress. She has no wheezes.  Musculoskeletal: Normal range of motion.  Mild synovitis of bilaterally second MCP joints, swelling of multiple PIP joints  Neurological: She is alert and oriented to person, place, and time.  Skin: Skin is warm and dry. No rash noted.     Psychiatric: She has a normal mood and affect. Her behavior is normal. Thought content normal.  Nursing note and vitals reviewed.   BP (!) 142/84   Pulse 95   Ht 5\' 1"  (1.549 m)   Wt 86 lb 12.8 oz (39.4 kg)   SpO2 98%   BMI 16.40 kg/m   Assessment and Plan: 1. Arthralgia of both hands Check labs Refer to Rheumatology after labs return Recommend low doses of tylenol due to daily alcohol intake - Sedimentation rate - Rheumatoid factor - ANA w/Reflex if Positive  2. Eye tearing, bilateral - Ambulatory referral to Ophthalmology  3. Need for influenza vaccination - Flu Vaccine QUAD 36+ mos IM   No orders of the defined types were placed in this encounter.   Partially dictated using Editor, commissioning. Any errors are unintentional.  Halina Maidens, MD [facility]  03/09/2017

## 2017-03-10 LAB — ANA W/REFLEX IF POSITIVE: Anti Nuclear Antibody(ANA): NEGATIVE

## 2017-03-10 LAB — SEDIMENTATION RATE: SED RATE: 2 mm/h (ref 0–40)

## 2017-03-10 LAB — RHEUMATOID FACTOR: RHEUMATOID FACTOR: 11.6 [IU]/mL (ref 0.0–13.9)

## 2017-03-27 ENCOUNTER — Other Ambulatory Visit: Payer: Self-pay | Admitting: Internal Medicine

## 2017-03-27 ENCOUNTER — Telehealth: Payer: Self-pay

## 2017-03-27 DIAGNOSIS — M25542 Pain in joints of left hand: Principal | ICD-10-CM

## 2017-03-27 DIAGNOSIS — M25541 Pain in joints of right hand: Secondary | ICD-10-CM

## 2017-03-27 NOTE — Telephone Encounter (Signed)
Referral sent 

## 2017-03-27 NOTE — Telephone Encounter (Signed)
Patient called stating she is still having trouble with painful hands. She stated she knows we did labs last time stated she doesn't have arthritis but both her daughters have it and she is constantly hurting. She would like referral to Rheumatology for this. Please Advise.

## 2017-03-27 NOTE — Telephone Encounter (Signed)
Patient informed. 

## 2017-04-12 ENCOUNTER — Other Ambulatory Visit: Payer: Self-pay | Admitting: Internal Medicine

## 2017-05-02 DIAGNOSIS — M19041 Primary osteoarthritis, right hand: Secondary | ICD-10-CM | POA: Insufficient documentation

## 2017-05-02 DIAGNOSIS — M19042 Primary osteoarthritis, left hand: Secondary | ICD-10-CM

## 2017-05-24 ENCOUNTER — Ambulatory Visit (INDEPENDENT_AMBULATORY_CARE_PROVIDER_SITE_OTHER): Payer: Medicaid Other | Admitting: Internal Medicine

## 2017-05-24 ENCOUNTER — Encounter: Payer: Self-pay | Admitting: Internal Medicine

## 2017-05-24 VITALS — BP 122/60 | HR 98 | Temp 99.1°F | Ht 66.0 in | Wt 86.0 lb

## 2017-05-24 DIAGNOSIS — J441 Chronic obstructive pulmonary disease with (acute) exacerbation: Secondary | ICD-10-CM | POA: Diagnosis not present

## 2017-05-24 MED ORDER — DOXYCYCLINE HYCLATE 100 MG PO TABS: 100.0000 mg | ORAL_TABLET | Freq: Two times a day (BID) | ORAL | 0 refills | Status: AC

## 2017-05-24 NOTE — Progress Notes (Signed)
Date:  05/24/2017   Name:  Kendra Mullins   DOB:  02-14-1961   MRN:  [id number]   Chief Complaint: URI (Started Sunday. Headache, congestion, cough, sore throat, cold chills, and body aches. ) URI   This is a new problem. The current episode started in the past 7 days. The problem has been gradually worsening. There has been no fever. Associated symptoms include congestion, coughing, headaches and a sore throat. Pertinent negatives include no chest pain, rash, swollen glands, vomiting or wheezing. She has tried acetaminophen for the symptoms. The treatment provided mild relief.    Review of Systems  Constitutional: Positive for fatigue. Negative for chills and fever.  HENT: Positive for congestion and sore throat.   Respiratory: Positive for cough. Negative for chest tightness and wheezing.   Cardiovascular: Negative for chest pain.  Gastrointestinal: Negative for vomiting.  Skin: Negative for rash.  Neurological: Positive for headaches.    Patient Active Problem List   Diagnosis Date Noted  . Continuous chronic alcoholism ([place]) 09/15/2014  . Ankle inflammation 09/15/2014  . Anxiety 09/15/2014  . Cough, persistent 09/15/2014  . Smokes tobacco daily 09/15/2014  . Essential (primary) hypertension 09/15/2014  . Arthralgia of hand 09/15/2014  . Hot flash, menopausal 09/15/2014  . MI (mitral incompetence) 09/15/2014  . Chronic obstructive pulmonary emphysema ([place]) 09/15/2014  . Low weight 09/15/2014    Prior to Admission medications   Medication Sig Start Date End Date Taking? Authorizing Provider  ALPRAZolam Duanne Moron) 0.5 MG tablet Take 0.5 mg by mouth 2 (two) times daily as needed for anxiety.    [provider]  ATROVENT HFA 17 MCG/ACT inhaler INHALE 2 PUFFS BY MOUTH EVERY 4 HOURS AS NEEDED FOR WHEEZING 04/12/17   Glean Hess, MD  benazepril (LOTENSIN) 5 MG tablet Take 1 tablet (5 mg total) by mouth daily. 11/21/16   Glean Hess, MD  diltiazem (DILACOR XR)  240 MG 24 hr capsule Take 1 capsule (240 mg total) by mouth daily. 01/16/17   Glean Hess, MD  fluticasone [facility]) 50 MCG/ACT nasal spray USE 2 SPRAYS IN [facility] NOSTRIL DAILY 02/09/17   Glean Hess, MD  Fluticasone-Salmeterol (ADVAIR) 100-50 MCG/DOSE AEPB Inhale 1 puff into the lungs 2 (two) times daily. 01/20/15   Glean Hess, MD  omeprazole (PRILOSEC) 40 MG capsule Take 1 capsule (40 mg total) by mouth daily. 10/07/15   Glean Hess, MD  PROAIR HFA 108 [phone] Base) MCG/ACT inhaler Inhale 2 puffs into the lungs every 6 (six) hours as needed for wheezing or shortness of breath. 11/21/16   Glean Hess, MD    Allergies  Allergen Reactions  . Azithromycin Nausea Only  . Codeine Nausea And Vomiting    Past Surgical History:  Procedure Laterality Date  . ABDOMINAL HYSTERECTOMY  1986    Social History   Tobacco Use  . Smoking status: Current Every Day Smoker    Packs/day: 1.00    Types: Cigarettes  . Smokeless tobacco: Never Used  Substance Use Topics  . Alcohol use: Yes    Alcohol/week: 0.0 oz    Comment: 6-7 each day  . Drug use: No     Medication list has been reviewed and updated.  PHQ 2/9 Scores 11/21/2016 02/13/2015  PHQ - 2 Score 1 0    Physical Exam  Constitutional: She is oriented to person, place, and time. She appears well-developed. She has a sickly appearance. No distress.  HENT:  Head: Normocephalic and atraumatic.  Cardiovascular: Normal rate and regular rhythm.  Pulmonary/Chest: Effort normal. No respiratory distress. She has decreased breath sounds. She has no wheezes. She has no rhonchi.  Musculoskeletal: Normal range of motion.  Neurological: She is alert and oriented to person, place, and time.  Skin: Skin is warm and dry. No rash noted.  Psychiatric: She has a normal mood and affect. Her behavior is normal. Thought content normal.  Nursing note and vitals reviewed.   BP 122/60   Pulse (!) 127   Temp 99.1 F (37.3 C) (Oral)    Ht 5\' 6"  (1.676 m)   Wt 86 lb (39 kg)   SpO2 99%   BMI 13.88 kg/m   Assessment and Plan: 1. Acute exacerbation of chronic obstructive pulmonary disease (COPD) (HCC) Continue tylenol Continue Albuterol - doxycycline (VIBRA-TABS) 100 MG tablet; Take 1 tablet (100 mg total) by mouth 2 (two) times daily for 10 days.  Dispense: 20 tablet; Refill: 0   Meds ordered this encounter  Medications  . doxycycline (VIBRA-TABS) 100 MG tablet    Sig: Take 1 tablet (100 mg total) by mouth 2 (two) times daily for 10 days.    Dispense:  20 tablet    Refill:  0    Partially dictated using Editor, commissioning. Any errors are unintentional.  Halina Maidens, MD [facility]  05/24/2017

## 2017-05-25 ENCOUNTER — Telehealth: Payer: Self-pay

## 2017-05-25 ENCOUNTER — Other Ambulatory Visit: Payer: Self-pay | Admitting: Internal Medicine

## 2017-05-25 MED ORDER — PREDNISONE 10 MG PO TABS: ORAL_TABLET | ORAL | 0 refills | Status: DC

## 2017-05-25 NOTE — Telephone Encounter (Signed)
Patient called stating she was told to call today if getting worse and needs prednisone. She stated she would like prednisone called into [facility].  Also, stated she was given antibiotics in her visit yesterday. She drinks several drinks of alcohol daily and wanted to know if she can drink with her antibiotics so she doesn't go into detoxing. Informed her Dr Army Melia said she can have 2 drinks of alcohol but continue to take her antibiotics. She stated it is unsafe to treat dt's without supervising it. She verbalized understanding.

## 2017-05-31 ENCOUNTER — Telehealth: Payer: Self-pay

## 2017-05-31 ENCOUNTER — Other Ambulatory Visit: Payer: Self-pay | Admitting: Internal Medicine

## 2017-05-31 DIAGNOSIS — J438 Other emphysema: Secondary | ICD-10-CM

## 2017-05-31 MED ORDER — CEFDINIR 300 MG PO CAPS: 300.0000 mg | ORAL_CAPSULE | Freq: Two times a day (BID) | ORAL | 0 refills | Status: DC

## 2017-05-31 NOTE — Telephone Encounter (Signed)
Patient called stating she was just seen last week. She finished prednisone but still taking the antibiotic we prescribed. She says she is getting much worse. Cough is harder and worse. She has a very bad headache as well. Lots of chest congestion now, but no fever. Wants to know if she can try different antibiotic or what to do?  Please Advise.

## 2017-05-31 NOTE — Telephone Encounter (Signed)
Sent new Rx to pharmacy.

## 2017-05-31 NOTE — Telephone Encounter (Signed)
Patient informed of new rx sent. Told to call back if not better AFTER finishing antibiotics this time. If not better then we will need to see her again for OV.

## 2017-06-09 ENCOUNTER — Ambulatory Visit
Admission: RE | Admit: 2017-06-09 | Discharge: 2017-06-09 | Disposition: A | Discharge status: Home / Self Care | Payer: Medicaid Other | Source: Ambulatory Visit | Attending: Internal Medicine | Admitting: Internal Medicine

## 2017-06-09 ENCOUNTER — Other Ambulatory Visit: Payer: Self-pay | Admitting: Internal Medicine

## 2017-06-09 ENCOUNTER — Encounter: Payer: Self-pay | Admitting: Internal Medicine

## 2017-06-09 ENCOUNTER — Ambulatory Visit (INDEPENDENT_AMBULATORY_CARE_PROVIDER_SITE_OTHER): Payer: Medicaid Other | Admitting: Internal Medicine

## 2017-06-09 VITALS — BP 128/82 | HR 78 | Temp 98.5°F | Resp 17 | Ht 66.0 in | Wt 86.0 lb

## 2017-06-09 DIAGNOSIS — J441 Chronic obstructive pulmonary disease with (acute) exacerbation: Secondary | ICD-10-CM | POA: Diagnosis not present

## 2017-06-09 DIAGNOSIS — I10 Essential (primary) hypertension: Secondary | ICD-10-CM

## 2017-06-09 DIAGNOSIS — R05 Cough: Secondary | ICD-10-CM | POA: Insufficient documentation

## 2017-06-09 MED ORDER — GUAIFENESIN-CODEINE 100-10 MG/5ML PO SYRP: 5.0000 mL | ORAL_SOLUTION | Freq: Three times a day (TID) | ORAL | 0 refills | Status: DC | PRN

## 2017-06-09 MED ORDER — PREDNISONE 10 MG PO TABS: ORAL_TABLET | ORAL | 0 refills | Status: DC

## 2017-06-09 NOTE — Progress Notes (Signed)
Date:  06/09/2017   Name:  Kendra Mullins   DOB:  [birth year]   MRN:  [id number]   Chief Complaint: Cough (3 weeks of cough-) Cough  This is a chronic problem. The current episode started 1 to 4 weeks ago. Associated symptoms include shortness of breath and wheezing. Pertinent negatives include no chest pain, chills, fever, headaches or sore throat.   Seen 2 weeks ago and given doxycycline and then omnicef.  She continues to cough and gets choked at times.  Some slight wheezing.  She tried robitussin but it caused nausea.  She is using albuterol inhaler with benefit.   Review of Systems  Constitutional: Positive for fatigue. Negative for chills and fever.  HENT: Negative for sinus pressure and sore throat.   Respiratory: Positive for cough, chest tightness, shortness of breath and wheezing.   Cardiovascular: Negative for chest pain and palpitations.  Gastrointestinal: Negative for diarrhea, nausea and vomiting.  Neurological: Negative for dizziness and headaches.    Patient Active Problem List   Diagnosis Date Noted  . Localized primary osteoarthritis of hands, bilateral 05/02/2017  . Continuous chronic alcoholism ([place]) 09/15/2014  . Ankle inflammation 09/15/2014  . Anxiety 09/15/2014  . Cough, persistent 09/15/2014  . Smokes tobacco daily 09/15/2014  . Essential (primary) hypertension 09/15/2014  . Arthralgia of hand 09/15/2014  . Hot flash, menopausal 09/15/2014  . MI (mitral incompetence) 09/15/2014  . Chronic obstructive pulmonary emphysema ([place]) 09/15/2014  . Low weight 09/15/2014    Prior to Admission medications   Medication Sig Start Date End Date Taking? Authorizing Provider  ALPRAZolam Duanne Moron) 0.5 MG tablet Take 0.5 mg by mouth 2 (two) times daily as needed for anxiety.    [provider]  ATROVENT HFA 17 MCG/ACT inhaler INHALE 2 PUFFS BY MOUTH EVERY 4 HOURS AS NEEDED FOR WHEEZING 04/12/17   Glean Hess, MD  benazepril (LOTENSIN) 5 MG tablet Take 1  tablet (5 mg total) by mouth daily. 11/21/16   Glean Hess, MD  cefdinir (OMNICEF) 300 MG capsule Take 1 capsule (300 mg total) by mouth 2 (two) times daily. 05/31/17   Glean Hess, MD  diltiazem (CARTIA XT) 240 MG 24 hr capsule Take 240 mg by mouth daily.    [provider]  fluticasone Asencion Islam) 50 MCG/ACT nasal spray USE 2 SPRAYS IN EACH NOSTRIL DAILY 02/09/17   Glean Hess, MD  omeprazole (PRILOSEC) 40 MG capsule Take 1 capsule (40 mg total) by mouth daily. 10/07/15   Glean Hess, MD  predniSONE (DELTASONE) 10 MG tablet Take 6 on day 1, 5 on day 2, 4 on day 3, 3 on day 4, 2 on day 5 and 1 on day 1 then stop. 05/25/17   Glean Hess, MD  PROAIR HFA 108 [phone] Base) MCG/ACT inhaler Inhale 2 puffs into the lungs every 6 (six) hours as needed for wheezing or shortness of breath. 11/21/16   Glean Hess, MD    Allergies  Allergen Reactions  . Azithromycin Nausea Only  . Codeine Nausea And Vomiting    Past Surgical History:  Procedure Laterality Date  . ABDOMINAL HYSTERECTOMY  1986    Social History   Tobacco Use  . Smoking status: Current Every Day Smoker    Packs/day: 0.05    Types: Cigarettes  . Smokeless tobacco: Never Used  Substance Use Topics  . Alcohol use: Yes    Alcohol/week: 0.0 oz    Comment: 6-7 each day  . Drug  use: No     Medication list has been reviewed and updated.  PHQ 2/9 Scores 11/21/2016 02/13/2015  PHQ - 2 Score 1 0    Physical Exam  Constitutional: She appears well-developed. She has a sickly appearance.  Neck: Normal range of motion. Neck supple. No thyromegaly present.  Cardiovascular: Normal rate, regular rhythm and normal heart sounds.  Pulmonary/Chest: No respiratory distress. She has no decreased breath sounds. She has no wheezes. She has no rhonchi. She has no rales.  Psychiatric: She has a normal mood and affect. Her speech is normal.    BP 128/82   Pulse 78   Temp 98.5 F (36.9 C)   Resp 17   Ht 5\' 6"   (1.676 m)   Wt 86 lb (39 kg)   SpO2 100%   BMI 13.88 kg/m   Assessment and Plan: 1. Acute exacerbation of chronic obstructive airways disease ([place]) Finish omnicef - DG Chest 2 View; Future - predniSONE (DELTASONE) 10 MG tablet; Take 6 on day 1and 2, 5 on day 3 and 4, 4 on day 5 and 6 , 3 on day 7 and 8, 2 on day 9 and 10 and 1 on day 11 and 12 then stop.  Dispense: 42 tablet; Refill: 0 - guaiFENesin-codeine (ROBITUSSIN AC) 100-10 MG/5ML syrup; Take 5 mLs by mouth 3 (three) times daily as needed for cough.  Dispense: 115 mL; Refill: 0   Meds ordered this encounter  Medications  . predniSONE (DELTASONE) 10 MG tablet    Sig: Take 6 on day 1and 2, 5 on day 3 and 4, 4 on day 5 and 6 , 3 on day 7 and 8, 2 on day 9 and 10 and 1 on day 11 and 12 then stop.    Dispense:  42 tablet    Refill:  0  . guaiFENesin-codeine (ROBITUSSIN AC) 100-10 MG/5ML syrup    Sig: Take 5 mLs by mouth 3 (three) times daily as needed for cough.    Dispense:  115 mL    Refill:  0    Partially dictated using Editor, commissioning. Any errors are unintentional.  Halina Maidens, MD [facility]  06/09/2017

## 2017-06-24 ENCOUNTER — Other Ambulatory Visit: Payer: Self-pay

## 2017-06-24 ENCOUNTER — Encounter: Payer: Self-pay | Admitting: Gynecology

## 2017-06-24 ENCOUNTER — Ambulatory Visit
Admission: EM | Admit: 2017-06-24 | Discharge: 2017-06-24 | Disposition: A | Discharge status: Home / Self Care | Payer: Medicaid Other | Attending: Family Medicine | Admitting: Family Medicine

## 2017-06-24 ENCOUNTER — Ambulatory Visit: Payer: Medicaid Other

## 2017-06-24 DIAGNOSIS — F4310 Post-traumatic stress disorder, unspecified: Secondary | ICD-10-CM | POA: Diagnosis not present

## 2017-06-24 DIAGNOSIS — M19042 Primary osteoarthritis, left hand: Secondary | ICD-10-CM | POA: Diagnosis not present

## 2017-06-24 DIAGNOSIS — R05 Cough: Secondary | ICD-10-CM | POA: Insufficient documentation

## 2017-06-24 DIAGNOSIS — J189 Pneumonia, unspecified organism: Secondary | ICD-10-CM | POA: Diagnosis not present

## 2017-06-24 DIAGNOSIS — M19041 Primary osteoarthritis, right hand: Secondary | ICD-10-CM | POA: Insufficient documentation

## 2017-06-24 DIAGNOSIS — J181 Lobar pneumonia, unspecified organism: Secondary | ICD-10-CM | POA: Diagnosis not present

## 2017-06-24 DIAGNOSIS — Z79899 Other long term (current) drug therapy: Secondary | ICD-10-CM | POA: Diagnosis not present

## 2017-06-24 DIAGNOSIS — F17210 Nicotine dependence, cigarettes, uncomplicated: Secondary | ICD-10-CM | POA: Insufficient documentation

## 2017-06-24 DIAGNOSIS — R0602 Shortness of breath: Secondary | ICD-10-CM | POA: Diagnosis not present

## 2017-06-24 DIAGNOSIS — R062 Wheezing: Secondary | ICD-10-CM | POA: Diagnosis not present

## 2017-06-24 DIAGNOSIS — I252 Old myocardial infarction: Secondary | ICD-10-CM | POA: Diagnosis not present

## 2017-06-24 DIAGNOSIS — Z885 Allergy status to narcotic agent status: Secondary | ICD-10-CM | POA: Insufficient documentation

## 2017-06-24 DIAGNOSIS — I10 Essential (primary) hypertension: Secondary | ICD-10-CM | POA: Insufficient documentation

## 2017-06-24 DIAGNOSIS — J440 Chronic obstructive pulmonary disease with acute lower respiratory infection: Secondary | ICD-10-CM | POA: Insufficient documentation

## 2017-06-24 DIAGNOSIS — R0989 Other specified symptoms and signs involving the circulatory and respiratory systems: Secondary | ICD-10-CM | POA: Diagnosis present

## 2017-06-24 DIAGNOSIS — Z881 Allergy status to other antibiotic agents status: Secondary | ICD-10-CM | POA: Diagnosis not present

## 2017-06-24 LAB — RAPID INFLUENZA A&B ANTIGENS (ARMC ONLY): INFLUENZA B (ARMC): NEGATIVE

## 2017-06-24 LAB — RAPID INFLUENZA A&B ANTIGENS: Influenza A (ARMC): NEGATIVE

## 2017-06-24 MED ORDER — HYDROCOD POLST-CPM POLST ER 10-8 MG/5ML PO SUER: 5.0000 mL | Freq: Every evening | ORAL | 0 refills | Status: DC | PRN

## 2017-06-24 MED ORDER — ALBUTEROL SULFATE HFA 108 (90 BASE) MCG/ACT IN AERS: 1.0000 | INHALATION_SPRAY | Freq: Four times a day (QID) | RESPIRATORY_TRACT | 0 refills | Status: DC | PRN

## 2017-06-24 MED ORDER — IPRATROPIUM-ALBUTEROL 0.5-2.5 (3) MG/3ML IN SOLN
3.0000 mL | Freq: Four times a day (QID) | RESPIRATORY_TRACT | Status: DC
  Administered 2017-06-24: 3 mL via RESPIRATORY_TRACT

## 2017-06-24 MED ORDER — METHYLPREDNISOLONE SODIUM SUCC 125 MG IJ SOLR
125.0000 mg | Freq: Once | INTRAMUSCULAR | Status: AC
  Administered 2017-06-24: 125 mg via INTRAMUSCULAR

## 2017-06-24 MED ORDER — LEVOFLOXACIN 750 MG PO TABS: 750.0000 mg | ORAL_TABLET | Freq: Every day | ORAL | 0 refills | Status: DC

## 2017-06-24 MED ORDER — PREDNISONE 20 MG PO TABS: 40.0000 mg | ORAL_TABLET | Freq: Every day | ORAL | 0 refills | Status: DC

## 2017-06-24 NOTE — ED Provider Notes (Signed)
[facility]    CSN: [id number] Arrival date & time: 06/24/17  1417     History   Chief Complaint Chief Complaint  Patient presents with  . Cough    HPI Kendra Mullins is a 57 y.o. female.  Presents to the urgent care facility for evaluation of cough times 1 month.  She states on 213 she was treated with antibiotic as well as 06/09/2017 she was treated with antibiotic.  Patient states she developed a fever 1 day ago along with some body aches.  She denies any chest pain.  She does have some shortness of breath and wheezing.  Has a history of COPD.  She denies any nausea vomiting or diarrhea.  She is using occasional albuterol and ipratropium but did not use this today.  Her cough is worse at nighttime it is slightly productive.  Patient denies any sore throat.  HPI  Past Medical History:  Diagnosis Date  . COPD (chronic obstructive pulmonary disease) ([place])   . Hypertension   . PTSD (post-traumatic stress disorder)     Patient Active Problem List   Diagnosis Date Noted  . Localized primary osteoarthritis of hands, bilateral 05/02/2017  . Continuous chronic alcoholism ([place]) 09/15/2014  . Ankle inflammation 09/15/2014  . Anxiety 09/15/2014  . Cough, persistent 09/15/2014  . Smokes tobacco daily 09/15/2014  . Essential (primary) hypertension 09/15/2014  . Arthralgia of hand 09/15/2014  . Hot flash, menopausal 09/15/2014  . MI (mitral incompetence) 09/15/2014  . Chronic obstructive pulmonary emphysema ([place]) 09/15/2014  . Low weight 09/15/2014    Past Surgical History:  Procedure Laterality Date  . ABDOMINAL HYSTERECTOMY  1986    OB History    Gravida Para Term Preterm AB Living   3       1 2    SAB TAB Ectopic Multiple Live Births   1               Home Medications    Prior to Admission medications   Medication Sig Start Date End Date Taking? Authorizing Provider  ALPRAZolam Duanne Moron) 0.5 MG tablet Take 0.5 mg by mouth 2 (two) times daily as needed  for anxiety.   Yes [provider]  ATROVENT HFA 17 MCG/ACT inhaler INHALE 2 PUFFS BY MOUTH EVERY 4 HOURS AS NEEDED FOR WHEEZING 06/10/17  Yes Glean Hess, MD  benazepril (LOTENSIN) 5 MG tablet TAKE 1 TABLET BY MOUTH DAILY 06/10/17  Yes Glean Hess, MD  diltiazem (CARDIZEM CD) 240 MG 24 hr capsule Take 240 mg by mouth daily.   Yes [provider]  fluticasone (FLONASE) 50 MCG/ACT nasal spray USE 2 SPRAYS IN EACH NOSTRIL DAILY 02/09/17  Yes Glean Hess, MD  albuterol (PROVENTIL HFA;VENTOLIN HFA) 108 (90 Base) MCG/ACT inhaler Inhale 1-2 puffs into the lungs every 6 (six) hours as needed for wheezing or shortness of breath. 06/24/17   Duanne Guess, PA-C  cefdinir (OMNICEF) 300 MG capsule Take 1 capsule (300 mg total) by mouth 2 (two) times daily. 05/31/17   Glean Hess, MD  chlorpheniramine-HYDROcodone [facility] PENNKINETIC ER) 10-8 MG/5ML SUER Take 5 mLs by mouth at bedtime as needed for cough. 06/24/17   Duanne Guess, PA-C  guaiFENesin-codeine (ROBITUSSIN AC) 100-10 MG/5ML syrup Take 5 mLs by mouth 3 (three) times daily as needed for cough. 06/09/17   Glean Hess, MD  levofloxacin (LEVAQUIN) 750 MG tablet Take 1 tablet (750 mg total) by mouth daily. 06/24/17   Duanne Guess,  PA-C  predniSONE (DELTASONE) 20 MG tablet Take 2 tablets (40 mg total) by mouth daily. 06/24/17   Duanne Guess, PA-C    Family History Family History  Problem Relation Age of Onset  . Cancer Mother        breast    Social History Social History   Tobacco Use  . Smoking status: Current Every Day Smoker    Packs/day: 0.05    Types: Cigarettes  . Smokeless tobacco: Never Used  Substance Use Topics  . Alcohol use: Yes    Alcohol/week: 0.0 oz    Comment: 6-7 each day  . Drug use: No     Allergies   Azithromycin and Codeine   Review of Systems Review of Systems  Constitutional: Negative for fever.  HENT: Negative for congestion, ear discharge, rhinorrhea,  sinus pressure, sinus pain, sore throat, trouble swallowing and voice change.   Respiratory: Positive for cough, shortness of breath and wheezing. Negative for stridor.   Cardiovascular: Negative for chest pain.  Gastrointestinal: Negative for abdominal pain, diarrhea, nausea and vomiting.  Genitourinary: Negative for dysuria, flank pain and pelvic pain.  Musculoskeletal: Negative for back pain and myalgias.  Skin: Negative for rash.  Neurological: Negative for dizziness and headaches.     Physical Exam Triage Vital Signs ED Triage Vitals  Enc Vitals Group     BP 06/24/17 1502 (!) 86/60     Pulse Rate 06/24/17 1502 (!) 129     Resp 06/24/17 1502 16     Temp 06/24/17 1502 98.8 F (37.1 C)     Temp Source 06/24/17 1502 Oral     SpO2 06/24/17 1502 97 %     Weight 06/24/17 1458 86 lb (39 kg)     Height 06/24/17 1458 5\' 6"  (1.676 m)     Head Circumference --      Peak Flow --      Pain Score 06/24/17 1458 2     Pain Loc --      Pain Edu? --      Excl. in [place]? --    No data found.  Updated Vital Signs BP (!) 86/60 (BP Location: Left Arm)   Pulse (!) 129   Temp 98.8 F (37.1 C) (Oral)   Resp 16   Ht 5\' 6"  (1.676 m)   Wt 86 lb (39 kg)   SpO2 97%   BMI 13.88 kg/m   Visual Acuity Right Eye Distance:   Left Eye Distance:   Bilateral Distance:    Right Eye Near:   Left Eye Near:    Bilateral Near:     Physical Exam  Constitutional: She is oriented to person, place, and time. She appears well-developed and well-nourished.  HENT:  Head: Normocephalic and atraumatic.  Right Ear: External ear normal.  Left Ear: External ear normal.  Mouth/Throat: No oropharyngeal exudate.  Eyes: Conjunctivae are normal.  Neck: Normal range of motion.  Cardiovascular: Normal rate.  Pulmonary/Chest: Effort normal. No respiratory distress. She has wheezes.  Slight decrease in air movement with wheezing noted.  After DuoNeb treatment air movement improved with less wheezing.  Vital signs  within normal limits.  Abdominal: Soft. She exhibits no distension. There is no tenderness.  Musculoskeletal: Normal range of motion.  Neurological: She is alert and oriented to person, place, and time.  Skin: Skin is warm. No rash noted.  Psychiatric: She has a normal mood and affect. Her behavior is normal. Thought content normal.     UC  Treatments / Results  Labs (all labs ordered are listed, but only abnormal results are displayed) Labs Reviewed  RAPID INFLUENZA A&B ANTIGENS ([place])    EKG  EKG Interpretation None       Radiology Dg Chest 2 View  Result Date: 06/24/2017 CLINICAL DATA:  Current smoker presenting with acute onset of cough and chest congestion. EXAM: CHEST - 2 VIEW COMPARISON:  06/09/2017, 03/01/2016 and earlier. FINDINGS: Cardiac silhouette normal in size, unchanged. Thoracic aorta atherosclerotic, unchanged. Hilar and mediastinal contours otherwise unremarkable. Hyperinflation and emphysematous changes throughout both lungs, unchanged. Minimal patchy opacities in the left lower lobe, best seen on the PA image. Lungs otherwise clear. No pleural effusions. Osseous demineralization. IMPRESSION: Left lower lobe pneumonia superimposed upon severe COPD/emphysema. Electronically Signed   By: Evangeline Dakin M.D.   On: 06/24/2017 16:08    Procedures Procedures (including critical care time)  Medications Ordered in UC Medications  ipratropium-albuterol (DUONEB) 0.5-2.5 (3) MG/3ML nebulizer solution 3 mL (3 mLs Nebulization Given 06/24/17 1545)  methylPREDNISolone sodium succinate (SOLU-MEDROL) 125 mg/2 mL injection 125 mg (125 mg Intramuscular Given 06/24/17 1545)     Initial Impression / Assessment and Plan / UC Course  I have reviewed the triage vital signs and the nursing notes.  Pertinent labs & imaging results that were available during my care of the patient were reviewed by me and considered in my medical decision making (see chart for details).      57 year old female with left lower lobe pneumonia.  X-rays reviewed by me today show consolidation on the left lower lobe.  She is started on Levaquin.  She will start prednisone.  She is given a prescription for albuterol inhaler.  She will follow-up PCP in 1 week for recheck.  She is educated on signs symptoms return to clinic for.  Final Clinical Impressions(s) / UC Diagnoses   Final diagnoses:  Community acquired pneumonia of left lower lobe of lung [facility])    ED Discharge Orders        Ordered    levofloxacin (LEVAQUIN) 750 MG tablet  Daily     06/24/17 1617    chlorpheniramine-HYDROcodone (TUSSIONEX PENNKINETIC ER) 10-8 MG/5ML SUER  At bedtime PRN     06/24/17 1617    predniSONE (DELTASONE) 20 MG tablet  Daily     06/24/17 1617    albuterol (PROVENTIL HFA;VENTOLIN HFA) 108 (90 Base) MCG/ACT inhaler  Every 6 hours PRN     06/24/17 1617        Duanne Guess, PA-C 06/24/17 1620

## 2017-06-24 NOTE — ED Triage Notes (Signed)
Per patient with cough since 05/25/2017. Pt. Stated seen by her primary care and was given Doxycycline, prednisone, cefdinir. Per patient not getting any better and believe she had pneumonia.

## 2017-06-24 NOTE — Discharge Instructions (Signed)
Please take antibiotics as prescribed.  Continue with albuterol and ipratropium inhalers as needed.  Take steroids as prescribed.  Follow-up with primary care provider and 5 days for recheck.  Return to the ER for any shortness of breath, fevers, worsening symptoms or urgent changes in your health.

## 2017-06-25 DIAGNOSIS — G459 Transient cerebral ischemic attack, unspecified: Secondary | ICD-10-CM | POA: Insufficient documentation

## 2017-06-26 ENCOUNTER — Telehealth: Payer: Self-pay

## 2017-06-26 NOTE — Telephone Encounter (Signed)
Dr Roma Kayser called from [facility] ER to speak to Dr Army Melia about patient. Wants to discuss things about patient before her hospital f/up on Friday. Left his Cell number for callback at ANYTIME. He referred to the patient as Kendra Mullins DOB Sep 09, 1960. In our system the patient is named differently but we confirmed this is the correct patient.  CALLBACK# [phone]  Please advise.

## 2017-06-28 MED ORDER — SUPER THERA VITE M PO TABS
1.00 | ORAL_TABLET | ORAL | Status: DC
Start: 2017-06-27 — End: 2017-06-28

## 2017-06-28 MED ORDER — DOXYCYCLINE MONOHYDRATE 100 MG PO TABS
100.00 | ORAL_TABLET | ORAL | Status: DC
Start: 2017-06-26 — End: 2017-06-28

## 2017-06-28 MED ORDER — IPRATROPIUM-ALBUTEROL 0.5-2.5 (3) MG/3ML IN SOLN
3.00 | RESPIRATORY_TRACT | Status: DC
Start: ? — End: 2017-06-28

## 2017-06-28 MED ORDER — GENERIC EXTERNAL MEDICATION
Status: DC
Start: ? — End: 2017-06-28

## 2017-06-28 MED ORDER — THIAMINE HCL 100 MG PO TABS
100.00 | ORAL_TABLET | ORAL | Status: DC
Start: 2017-06-27 — End: 2017-06-28

## 2017-06-28 MED ORDER — FLUTICASONE PROPIONATE 50 MCG/ACT NA SUSP
2.00 | NASAL | Status: DC
Start: 2017-06-27 — End: 2017-06-28

## 2017-06-28 MED ORDER — NICOTINE 21 MG/24HR TD PT24
1.00 | MEDICATED_PATCH | TRANSDERMAL | Status: DC
Start: 2017-06-27 — End: 2017-06-28

## 2017-06-28 MED ORDER — PREDNISONE 20 MG PO TABS
40.00 | ORAL_TABLET | ORAL | Status: DC
Start: 2017-06-27 — End: 2017-06-28

## 2017-06-28 MED ORDER — LIDOCAINE HCL 1 % IJ SOLN
0.50 | INTRAMUSCULAR | Status: DC
Start: ? — End: 2017-06-28

## 2017-06-28 MED ORDER — HEPARIN SODIUM (PORCINE) 5000 UNIT/ML IJ SOLN
5000.00 | INTRAMUSCULAR | Status: DC
Start: 2017-06-26 — End: 2017-06-28

## 2017-06-28 MED ORDER — ASPIRIN EC 81 MG PO TBEC
81.00 | DELAYED_RELEASE_TABLET | ORAL | Status: DC
Start: 2017-06-27 — End: 2017-06-28

## 2017-06-28 MED ORDER — IPRATROPIUM-ALBUTEROL 0.5-2.5 (3) MG/3ML IN SOLN
3.00 | RESPIRATORY_TRACT | Status: DC
Start: 2017-06-26 — End: 2017-06-28

## 2017-06-28 MED ORDER — DILTIAZEM HCL ER COATED BEADS 120 MG PO CP24
240.00 | ORAL_CAPSULE | ORAL | Status: DC
Start: 2017-06-27 — End: 2017-06-28

## 2017-06-28 MED ORDER — GUAIFENESIN ER 600 MG PO TB12
600.00 | ORAL_TABLET | ORAL | Status: DC
Start: 2017-06-26 — End: 2017-06-28

## 2017-06-28 MED ORDER — BENAZEPRIL HCL 5 MG PO TABS
5.00 | ORAL_TABLET | ORAL | Status: DC
Start: 2017-06-27 — End: 2017-06-28

## 2017-06-28 MED ORDER — BUDESONIDE-FORMOTEROL FUMARATE 80-4.5 MCG/ACT IN AERO
2.00 | INHALATION_SPRAY | RESPIRATORY_TRACT | Status: DC
Start: 2017-06-26 — End: 2017-06-28

## 2017-06-30 ENCOUNTER — Inpatient Hospital Stay: Payer: Medicaid Other | Admitting: Internal Medicine

## 2017-06-30 NOTE — Progress Notes (Deleted)
Date:  06/30/2017   Name:  Kendra Mullins   DOB:  02-04-1961   MRN:  [id number]   Chief Complaint: No chief complaint on file. Hospital follow up from [facility] 06/24/17- 06/27/17.  TIA sx resolved and felt all due to tobacco use.  Positive bubble study likely from small insignificant ASD or PFO.  Discharge MD called to make sure she had follow up and possible LE dopplers to rule out DVT/embolic source of TIA.  Admission Diagnoses:  Cough [R05] Hypokalemia [E87.6] Alcohol abuse [F10.10] Hyponatremia [E87.1] Neurological symptoms [R29.90] Chronic obstructive pulmonary disease with acute exacerbation (CMS-HCC) [J44.1]  Discharge Diagnoses:  Principal Problem: TIA (transient ischemic attack) - suspected due to smoking.  Active Problems: Hypertension Alcohol abuse, daily use COPD (chronic obstructive pulmonary disease) , unspecified (CMS-HCC) Cough Smokes tobacco daily  ECHO and bubble study: NORMAL LEFT VENTRICULAR SYSTOLIC FUNCTION WITH MILD LVH   NORMAL LA PRESSURES WITH NORMAL DIASTOLIC FUNCTION   NORMAL RIGHT VENTRICULAR SYSTOLIC FUNCTION   VALVULAR REGURGITATION: MODERATE MR, MILD TR   NO VALVULAR STENOSIS SALINE MICROCAVITATION: LATE POSITIVE AT REST AND W/ VALSALVA   Review of Systems  Patient Active Problem List   Diagnosis Date Noted  . Localized primary osteoarthritis of hands, bilateral 05/02/2017  . Continuous chronic alcoholism ([place]) 09/15/2014  . Ankle inflammation 09/15/2014  . Anxiety 09/15/2014  . Cough, persistent 09/15/2014  . Smokes tobacco daily 09/15/2014  . Essential (primary) hypertension 09/15/2014  . Arthralgia of hand 09/15/2014  . Hot flash, menopausal 09/15/2014  . MI (mitral incompetence) 09/15/2014  . Chronic obstructive pulmonary emphysema ([place]) 09/15/2014  . Low weight 09/15/2014    Prior to Admission medications   Medication Sig Start Date End Date Taking? Authorizing Provider  albuterol (PROVENTIL HFA;VENTOLIN HFA) 108 (90 Base)  MCG/ACT inhaler Inhale 1-2 puffs into the lungs every 6 (six) hours as needed for wheezing or shortness of breath. 06/24/17   Duanne Guess, PA-C  ALPRAZolam Duanne Moron) 0.5 MG tablet Take 0.5 mg by mouth 2 (two) times daily as needed for anxiety.    [provider]  ATROVENT HFA 17 MCG/ACT inhaler INHALE 2 PUFFS BY MOUTH EVERY 4 HOURS AS NEEDED FOR WHEEZING 06/10/17   Glean Hess, MD  benazepril (LOTENSIN) 5 MG tablet TAKE 1 TABLET BY MOUTH DAILY 06/10/17   Glean Hess, MD  cefdinir (OMNICEF) 300 MG capsule Take 1 capsule (300 mg total) by mouth 2 (two) times daily. 05/31/17   Glean Hess, MD  chlorpheniramine-HYDROcodone [facility] PENNKINETIC ER) 10-8 MG/5ML SUER Take 5 mLs by mouth at bedtime as needed for cough. 06/24/17   Duanne Guess, PA-C  diltiazem (CARDIZEM CD) 240 MG 24 hr capsule Take 240 mg by mouth daily.    [provider]  fluticasone (FLONASE) 50 MCG/ACT nasal spray USE 2 SPRAYS IN EACH NOSTRIL DAILY 02/09/17   Glean Hess, MD  guaiFENesin-codeine [facility]) 100-10 MG/5ML syrup Take 5 mLs by mouth 3 (three) times daily as needed for cough. 06/09/17   Glean Hess, MD  levofloxacin (LEVAQUIN) 750 MG tablet Take 1 tablet (750 mg total) by mouth daily. 06/24/17   Duanne Guess, PA-C  predniSONE (DELTASONE) 20 MG tablet Take 2 tablets (40 mg total) by mouth daily. 06/24/17   Duanne Guess, PA-C    Allergies  Allergen Reactions  . Azithromycin Nausea Only  . Codeine Nausea And Vomiting    Past Surgical History:  Procedure Laterality Date  . ABDOMINAL HYSTERECTOMY  1986    Social History   Tobacco Use  . Smoking status: Current Every Day Smoker    Packs/day: 0.05    Types: Cigarettes  . Smokeless tobacco: Never Used  Substance Use Topics  . Alcohol use: Yes    Alcohol/week: 0.0 oz    Comment: 6-7 each day  . Drug use: No     Medication list has been reviewed and updated.  PHQ 2/9 Scores 11/21/2016 02/13/2015  PHQ - 2  Score 1 0    Physical Exam  There were no vitals taken for this visit.  Assessment and Plan:

## 2017-07-03 ENCOUNTER — Encounter: Payer: Self-pay | Admitting: Internal Medicine

## 2017-07-03 ENCOUNTER — Ambulatory Visit (INDEPENDENT_AMBULATORY_CARE_PROVIDER_SITE_OTHER): Payer: Medicaid Other | Admitting: Internal Medicine

## 2017-07-03 VITALS — BP 144/90 | HR 101 | Ht 66.0 in | Wt 86.0 lb

## 2017-07-03 DIAGNOSIS — Z720 Tobacco use: Secondary | ICD-10-CM | POA: Diagnosis not present

## 2017-07-03 DIAGNOSIS — I10 Essential (primary) hypertension: Secondary | ICD-10-CM

## 2017-07-03 DIAGNOSIS — R9389 Abnormal findings on diagnostic imaging of other specified body structures: Secondary | ICD-10-CM

## 2017-07-03 DIAGNOSIS — G459 Transient cerebral ischemic attack, unspecified: Secondary | ICD-10-CM

## 2017-07-03 DIAGNOSIS — F17200 Nicotine dependence, unspecified, uncomplicated: Secondary | ICD-10-CM

## 2017-07-03 MED ORDER — NICOTINE 21 MG/24HR TD PT24: 21.0000 mg | MEDICATED_PATCH | Freq: Every day | TRANSDERMAL | 0 refills | Status: DC

## 2017-07-03 NOTE — Progress Notes (Signed)
Date:  07/03/2017   Name:  Kendra Mullins   DOB:  08/27/1960   MRN:  [id number]   Chief Complaint: Hospitalization Follow-up Admitted to [facility] 3/16 to 06/26/17.  Received phone call on 06/26/17. Discharge Diagnoses:  Principal Problem: TIA (transient ischemic attack) Active Problems: Hypertension Alcohol abuse, daily use COPD (chronic obstructive pulmonary disease) , unspecified (CMS-HCC) Cough Smokes tobacco daily - infiltrate seen on CXR - treated with doxycycline  TIA felt to be due to smoking.  She was discharged on aspirin, lipitor and doxycycline but is not taking Doxy; taking Levaquin given by UC just before admission. After discharge, her ECHO and bubble study came back positive. She did not have [place] legs to look for DVT; she denies leg swelling or redness. She is going to quit smoking.  She requests an Rx for nicotine patches.  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 Prior small lacunar infarction. No evidence of distal internal carotid artery stenosis or aneurysm. Intracranial ICAs: Patent bilaterally from the skull base to the carotid terminus.  ECHO:  EF 57% Aortic: No AR                  No AS     Mitral: MODERATE MR            No MS    MV Inflow E Vel.= 131.0 cm/s  MV Annulus E'Vel.= 13.0 cm/s  E/E'Ratio= 10  Tricuspid: MILD TR                No TS            1.9 m/s peak TR vel   24 mmHg peak RV pressure  Pulmonary: No PR                  No PS      Other:            SALINE MICROCAVITATIONS LATE POSITIVE AT REST  Contrast: SALINE MICROCAVITATION: LATE POSITIVE AT REST AND W/ VALSALVA   Review of Systems  Constitutional: Negative for chills, fatigue and fever.  HENT: Negative for congestion, hearing loss, tinnitus, trouble swallowing and voice change.   Eyes: Negative for visual disturbance.  Respiratory: Positive for cough. Negative for chest tightness, shortness of breath and  wheezing.   Cardiovascular: Negative for chest pain, palpitations and leg swelling.  Gastrointestinal: Negative for abdominal pain, constipation, diarrhea and vomiting.  Endocrine: Negative for polydipsia and polyuria.  Genitourinary: Negative for dysuria, frequency, genital sores, vaginal bleeding and vaginal discharge.  Musculoskeletal: Negative for arthralgias, gait problem and joint swelling.  Skin: Negative for color change and rash.  Neurological: Positive for tremors (mild tremor of left hand). Negative for dizziness, light-headedness and headaches.  Hematological: Negative for adenopathy. Does not bruise/bleed easily.  Psychiatric/Behavioral: Negative for dysphoric mood and sleep disturbance. The patient is not nervous/anxious.     Patient Active Problem List   Diagnosis Date Noted  . TIA (transient ischemic attack) 06/25/2017  . Localized primary osteoarthritis of hands, bilateral 05/02/2017  . Continuous chronic alcoholism ([place]) 09/15/2014  . Ankle inflammation 09/15/2014  . Anxiety 09/15/2014  . Cough, persistent 09/15/2014  . Smokes tobacco daily 09/15/2014  . Essential (primary) hypertension 09/15/2014  . Arthralgia of hand 09/15/2014  . Hot flash, menopausal 09/15/2014  . MI (mitral incompetence) 09/15/2014  . Chronic obstructive pulmonary emphysema ([place]) 09/15/2014  . Low weight 09/15/2014    Prior  to Admission medications   Medication Sig Start Date End Date Taking? Authorizing Provider  albuterol (PROVENTIL HFA;VENTOLIN HFA) 108 (90 Base) MCG/ACT inhaler Inhale 1-2 puffs into the lungs every 6 (six) hours as needed for wheezing or shortness of breath. 06/24/17   Duanne Guess, PA-C  ALPRAZolam Duanne Moron) 0.5 MG tablet Take 0.5 mg by mouth 2 (two) times daily as needed for anxiety.    [provider]  ATROVENT HFA 17 MCG/ACT inhaler INHALE 2 PUFFS BY MOUTH EVERY 4 HOURS AS NEEDED FOR WHEEZING 06/10/17   Glean Hess, MD  benazepril (LOTENSIN) 5 MG tablet  TAKE 1 TABLET BY MOUTH DAILY 06/10/17   Glean Hess, MD  cefdinir (OMNICEF) 300 MG capsule Take 1 capsule (300 mg total) by mouth 2 (two) times daily. 05/31/17   Glean Hess, MD  chlorpheniramine-HYDROcodone [facility] PENNKINETIC ER) 10-8 MG/5ML SUER Take 5 mLs by mouth at bedtime as needed for cough. 06/24/17   Duanne Guess, PA-C  diltiazem (CARDIZEM CD) 240 MG 24 hr capsule Take 240 mg by mouth daily.    [provider]  fluticasone (FLONASE) 50 MCG/ACT nasal spray USE 2 SPRAYS IN EACH NOSTRIL DAILY 02/09/17   Glean Hess, MD  guaiFENesin-codeine [facility]) 100-10 MG/5ML syrup Take 5 mLs by mouth 3 (three) times daily as needed for cough. 06/09/17   Glean Hess, MD  levofloxacin (LEVAQUIN) 750 MG tablet Take 1 tablet (750 mg total) by mouth daily. 06/24/17   Duanne Guess, PA-C  predniSONE (DELTASONE) 20 MG tablet Take 2 tablets (40 mg total) by mouth daily. 06/24/17   Duanne Guess, PA-C    Allergies  Allergen Reactions  . Azithromycin Nausea Only  . Codeine Nausea And Vomiting    Past Surgical History:  Procedure Laterality Date  . ABDOMINAL HYSTERECTOMY  1986    Social History   Tobacco Use  . Smoking status: Current Every Day Smoker    Packs/day: 0.05    Types: Cigarettes  . Smokeless tobacco: Never Used  Substance Use Topics  . Alcohol use: Yes    Alcohol/week: 0.0 oz    Comment: 6-7 each day  . Drug use: No     Medication list has been reviewed and updated.  PHQ 2/9 Scores 11/21/2016 02/13/2015  PHQ - 2 Score 1 0    Physical Exam  Constitutional: She is oriented to person, place, and time. She appears well-developed. No distress.  HENT:  Head: Normocephalic and atraumatic.  Neck: Normal range of motion. Neck supple. No thyromegaly present.  Cardiovascular: Normal rate, regular rhythm and normal heart sounds.  No murmur heard. Pulmonary/Chest: Effort normal. No respiratory distress. She has wheezes.  Abdominal: Soft. Bowel  sounds are normal.  Musculoskeletal: She exhibits no edema or tenderness.  Neurological: She is alert and oriented to person, place, and time. She has normal strength and normal reflexes. No cranial nerve deficit or sensory deficit. Gait normal.  Skin: Skin is warm and dry. No rash noted.  Psychiatric: She has a normal mood and affect. Her speech is normal and behavior is normal. Thought content normal.  Nursing note and vitals reviewed.   BP (!) 144/90   Pulse (!) 101   Ht 5\' 6"  (1.676 m)   Wt 86 lb (39 kg)   SpO2 98%   BMI 13.88 kg/m   Assessment and Plan: 1. TIA (transient ischemic attack) Continue Aspirin and Lipitor Positive Bubble study - need [place] to rule out DVT and  determine need for cardiology consult - US Venous Img Lower Bilateral; Future  2. Essential (primary) hypertension controlled  3. Smokes tobacco daily Working on quitting - nicotine (NICODERM CQ - DOSED IN MG/24 HOURS) 21 mg/24hr patch; Place 1 patch (21 mg total) onto the skin daily.  Dispense: 28 patch; Refill: 0  4. Abnormal CT of the chest Needs follow up in 3 months Will give Prevar-13 next visit - CT Chest Wo Contrast; Future   Meds ordered this encounter  Medications  . nicotine (NICODERM CQ - DOSED IN MG/24 HOURS) 21 mg/24hr patch    Sig: Place 1 patch (21 mg total) onto the skin daily.    Dispense:  28 patch    Refill:  0    Partially dictated using Editor, commissioning. Any errors are unintentional.  Halina Maidens, MD [facility]  07/03/2017

## 2017-07-05 ENCOUNTER — Other Ambulatory Visit: Payer: Self-pay

## 2017-07-05 MED ORDER — DILTIAZEM HCL ER COATED BEADS 240 MG PO TB24: 240.0000 mg | ORAL_TABLET | Freq: Every day | ORAL | 0 refills | Status: DC

## 2017-07-06 ENCOUNTER — Other Ambulatory Visit: Payer: Self-pay | Admitting: Internal Medicine

## 2017-07-06 DIAGNOSIS — J438 Other emphysema: Secondary | ICD-10-CM

## 2017-07-10 ENCOUNTER — Ambulatory Visit: Admission: RE | Admit: 2017-07-10 | Payer: Medicaid Other | Source: Ambulatory Visit

## 2017-07-10 ENCOUNTER — Ambulatory Visit: Payer: Medicaid Other

## 2017-07-11 ENCOUNTER — Ambulatory Visit: Payer: Medicaid Other

## 2017-07-18 ENCOUNTER — Ambulatory Visit
Admission: RE | Admit: 2017-07-18 | Discharge: 2017-07-18 | Disposition: A | Discharge status: Home / Self Care | Payer: Medicaid Other | Source: Ambulatory Visit | Attending: Internal Medicine | Admitting: Internal Medicine

## 2017-07-18 ENCOUNTER — Encounter (INDEPENDENT_AMBULATORY_CARE_PROVIDER_SITE_OTHER): Payer: Self-pay

## 2017-07-18 DIAGNOSIS — G459 Transient cerebral ischemic attack, unspecified: Secondary | ICD-10-CM | POA: Insufficient documentation

## 2017-07-21 ENCOUNTER — Other Ambulatory Visit: Payer: Self-pay | Admitting: Internal Medicine

## 2017-08-07 ENCOUNTER — Other Ambulatory Visit: Payer: Self-pay | Admitting: Internal Medicine

## 2017-08-07 DIAGNOSIS — F17200 Nicotine dependence, unspecified, uncomplicated: Secondary | ICD-10-CM

## 2017-08-09 ENCOUNTER — Other Ambulatory Visit: Payer: Self-pay | Admitting: Internal Medicine

## 2017-08-09 DIAGNOSIS — J438 Other emphysema: Secondary | ICD-10-CM

## 2017-08-30 ENCOUNTER — Telehealth: Payer: Self-pay

## 2017-08-30 NOTE — Telephone Encounter (Signed)
Called [employer] to initiate Prior Auth for patient Chest CT WO Contrast. They sent me to [facility] to do the PA. [phone]  Spoke to [employer]. Completed Clinic Questions as requested and they stated we needed to FAX them clinical notes for review. Faxed notes with confirmation fax to [phone]. The rep stated they have 48 business hours to review our request. Will fax [place] outcome for patient CT. Awaiting fax.

## 2017-08-30 NOTE — Telephone Encounter (Signed)
Patient Case ID # for current pending CT of the chest with [employer] (Medicaid) is [id number].   Missed adding this to last telephone call about PA.

## 2017-09-29 ENCOUNTER — Ambulatory Visit: Payer: Medicaid Other

## 2017-10-09 ENCOUNTER — Other Ambulatory Visit: Payer: Self-pay | Admitting: Internal Medicine

## 2017-10-10 ENCOUNTER — Telehealth: Payer: Self-pay

## 2017-10-10 ENCOUNTER — Ambulatory Visit: Payer: Medicaid Other | Admitting: Internal Medicine

## 2017-10-10 NOTE — Telephone Encounter (Signed)
Procedure: CT Chest Angiogram.  Technique: CT examination of the chest was performed according to pulmonary embolism protocol.Volumetric chest CT acquisition was performed from the lower neck to the adrenal glands.Study was acquired following intravenous administration of iodinated contrast material, given the indications for the examination.If intravenous contrast material had not been administered, the likelihood of detecting abnormalities relevant to the patient's condition would have been substantially decreased.75 mL of Isovue 370 was injected intravenously.Images were reconstructed as follows: 5 x 5 mm axial sections; 1.25 x 1.25 mm axial sections.5 x 5 mm axial maximum intensity projection images (MIPS) were reconstructed to facilitate lung nodule detection.  This examination was interpreted using multiplanar or 3D reconstructions.  This rendering was performed because of the indication(s) for the examinations.If multiplanar or 3D reconstructions had not been performed, the likelihood of detecting an abnormality relevant to the patient's condition may have been decreased.  Indication: Evaluate for cause of COPD Exacerbation, J44.1 Chronic obstructive pulmonary disease with (acute) exacerbation (CMS-HCC)  Comparison: None.  Comment: Contrast enhancement of the central pulmonary arteries is optimal. There is no evidence of pulmonary embolism.  Diffuse, moderate centrilobular emphysema is seen. There are scattered groundglass opacities in the lungs bilaterally, likely infectious or inflammatory in etiology, many of which are nodular in configuration. There is extensive irregular biapical consolidative opacity with a large nodular component at the right lung apex on series 6 image 41 measuring approximately 2.2 x 1.2 cm. Dependent confluent opacity at the lung bases, left greater than right, probably represents atelectasis.  The heart is normal in size. There is  no pericardial effusion. There is no evidence of axillary lymphadenopathy. There is no evidence of mediastinal lymphadenopathy by CT size criteria. Visualized portions of the thyroid gland appear normal. There is no evidence of axillary lymphadenopathy. A right Bochdalek hernia is noted. There is a very tiny left pleural effusion. No right pleural fluid is identified.  Limited early arterial phase upper abdominal images demonstrate atherosclerotic calcification at the origin of the left renal artery and are otherwise unremarkable.  No aggressive osseous lesions are seen.  IMPRESSION: 1. No evidence of pulmonary embolism. 2. Centrilobular emphysema. 3. Numerous groundglass opacities some with nodular components throughout both lungs, probably infectious or inflammatory in etiology. Biapical solid irregular opacities probably scarring, also with nodular components. Follow-up CT in 3 months is recommended to assess for resolution.  Electronically Signed EG:BTDVV Davis Gourd, Arvin, [facility] Electronically Signed on:06/26/2017 9:06 AM  Other Result Information  Interface, Rad Results In - 06/26/2017  9:07 AM EDT Procedure: CT Chest Angiogram.  Technique: CT examination of the chest was performed according to pulmonary embolism protocol.  Volumetric chest CT acquisition was performed from the lower neck to the adrenal glands.  Study was acquired following intravenous administration of iodinated contrast material, given the indications for the examination.  If intravenous contrast material had not been administered, the likelihood of detecting abnormalities relevant to the patient's condition would have been substantially decreased.  75 mL of Isovue 370 was injected intravenously.  Images were reconstructed as follows: 5 x 5 mm axial sections; 1.25 x 1.25 mm axial sections.  5 x 5 mm axial maximum intensity projection images (MIPS) were reconstructed to facilitate lung nodule  detection.  This examination was interpreted using multiplanar or 3D reconstructions.  This rendering was performed because of the indication(s) for the examinations.  If multiplanar or 3D reconstructions had not been performed, the likelihood of detecting an abnormality relevant to the patient's condition may have been  decreased.  Indication: Evaluate for cause of COPD Exacerbation, J44.1 Chronic obstructive pulmonary disease with (acute) exacerbation (CMS-HCC)  Comparison: None.  Comment: Contrast enhancement of the central pulmonary arteries is optimal. There is no evidence of pulmonary embolism.  Diffuse, moderate centrilobular emphysema is seen. There are scattered groundglass opacities in the lungs bilaterally, likely infectious or inflammatory in etiology, many of which are nodular in configuration. There is extensive irregular biapical consolidative opacity with a large nodular component at the right lung apex on series 6 image 41 measuring approximately 2.2 x 1.2 cm. Dependent confluent opacity at the lung bases, left greater than right, probably represents atelectasis.  The heart is normal in size. There is no pericardial effusion. There is no evidence of axillary lymphadenopathy. There is no evidence of mediastinal lymphadenopathy by CT size criteria. Visualized portions of the thyroid gland appear normal. There is no evidence of axillary lymphadenopathy. A right Bochdalek hernia is noted. There is a very tiny left pleural effusion. No right pleural fluid is identified.  Limited early arterial phase upper abdominal images demonstrate atherosclerotic calcification at the origin of the left renal artery and are otherwise unremarkable.  No aggressive osseous lesions are seen.  IMPRESSION: 1. No evidence of pulmonary embolism. 2. Centrilobular emphysema. 3. Numerous groundglass opacities some with nodular components throughout both lungs, probably infectious or  inflammatory in etiology. Biapical solid irregular opacities probably scarring, also with nodular components. Follow-up CT in 3 months is recommended to assess for resolution.  Electronically Signed by:  Elsie Lincoln, MD, [facility] Electronically Signed on:  06/26/2017 9:06 AM

## 2017-10-10 NOTE — Telephone Encounter (Signed)
Tillie Rung form [facility] needs pre-certification completed before 10/13/2017 for patient CT of chest. Please advise

## 2017-10-11 ENCOUNTER — Other Ambulatory Visit: Payer: Self-pay | Admitting: Internal Medicine

## 2017-10-11 ENCOUNTER — Telehealth: Payer: Self-pay

## 2017-10-11 NOTE — Telephone Encounter (Signed)
LMOM for Kendra Mullins imaging department regarding Mrs.; Kendra Mullins ward approval for her CT of chest.

## 2017-10-11 NOTE — Telephone Encounter (Signed)
Please check on the status of this pre-cert.

## 2017-10-11 NOTE — Telephone Encounter (Signed)
Case number [id number] was approved through [place].

## 2017-10-13 ENCOUNTER — Ambulatory Visit: Payer: Medicaid Other

## 2017-10-16 ENCOUNTER — Other Ambulatory Visit: Payer: Self-pay | Admitting: Internal Medicine

## 2017-10-16 DIAGNOSIS — F17200 Nicotine dependence, unspecified, uncomplicated: Secondary | ICD-10-CM

## 2017-11-09 ENCOUNTER — Other Ambulatory Visit: Payer: Self-pay | Admitting: Internal Medicine

## 2017-11-09 DIAGNOSIS — J438 Other emphysema: Secondary | ICD-10-CM

## 2017-11-09 DIAGNOSIS — F17200 Nicotine dependence, unspecified, uncomplicated: Secondary | ICD-10-CM

## 2017-12-01 ENCOUNTER — Encounter: Payer: Medicaid Other | Admitting: Internal Medicine

## 2017-12-06 ENCOUNTER — Other Ambulatory Visit: Payer: Self-pay | Admitting: Internal Medicine

## 2017-12-06 ENCOUNTER — Ambulatory Visit (INDEPENDENT_AMBULATORY_CARE_PROVIDER_SITE_OTHER): Payer: Medicaid Other | Admitting: Internal Medicine

## 2017-12-06 ENCOUNTER — Encounter: Payer: Self-pay | Admitting: Internal Medicine

## 2017-12-06 VITALS — BP 130/68 | HR 113 | Ht 66.0 in | Wt 83.0 lb

## 2017-12-06 DIAGNOSIS — Z23 Encounter for immunization: Secondary | ICD-10-CM

## 2017-12-06 DIAGNOSIS — G459 Transient cerebral ischemic attack, unspecified: Secondary | ICD-10-CM

## 2017-12-06 DIAGNOSIS — I10 Essential (primary) hypertension: Secondary | ICD-10-CM | POA: Diagnosis not present

## 2017-12-06 DIAGNOSIS — J438 Other emphysema: Secondary | ICD-10-CM | POA: Diagnosis not present

## 2017-12-06 MED ORDER — BENAZEPRIL HCL 5 MG PO TABS: 5.0000 mg | ORAL_TABLET | Freq: Every day | ORAL | 5 refills | Status: DC

## 2017-12-06 MED ORDER — CARDIZEM LA 240 MG PO TB24: 240.0000 mg | ORAL_TABLET | Freq: Every day | ORAL | 5 refills | Status: DC

## 2017-12-06 MED ORDER — ALBUTEROL SULFATE HFA 108 (90 BASE) MCG/ACT IN AERS: 2.0000 | INHALATION_SPRAY | Freq: Four times a day (QID) | RESPIRATORY_TRACT | 0 refills | Status: DC | PRN

## 2017-12-06 MED ORDER — ATORVASTATIN CALCIUM 40 MG PO TABS: 40.0000 mg | ORAL_TABLET | Freq: Every day | ORAL | 5 refills | Status: DC

## 2017-12-06 MED ORDER — IPRATROPIUM BROMIDE HFA 17 MCG/ACT IN AERS: 2.0000 | INHALATION_SPRAY | Freq: Four times a day (QID) | RESPIRATORY_TRACT | 5 refills | Status: DC | PRN

## 2017-12-06 NOTE — Progress Notes (Signed)
Date:  12/06/2017   Name:  Kendra Mullins   DOB:  11/08/1960   MRN:  [id number]   Chief Complaint: Hypertension and Hyperlipidemia Hypertension  This is a chronic problem. The problem is unchanged. The problem is controlled. Pertinent negatives include no chest pain, headaches, palpitations or shortness of breath. Past treatments include calcium channel blockers and ACE inhibitors. The current treatment provides moderate improvement.  Hyperlipidemia  This is a chronic problem. The problem is controlled. Pertinent negatives include no chest pain or shortness of breath. Current antihyperlipidemic treatment includes statins. The current treatment provides significant improvement of lipids.  TIA - had sx last year, bubble study positive but [place] legs negative.  Abnormal CT chest: IMPRESSION: 1. No evidence of pulmonary embolism. 2. Centrilobular emphysema. 3. Numerous groundglass opacities some with nodular components throughout both lungs, probably infectious or inflammatory in etiology. Biapical solid irregular opacities probably scarring, also with nodular components. Follow-up CT in 3 months is recommended to assess for resolution. CT was scheduled for June but she did not keep the appointment due to family issues.  Lab Results  Component Value Date   CREATININE 0.71 11/21/2016   BUN 8 11/21/2016   NA 139 11/21/2016   K 4.5 11/21/2016   CL 101 11/21/2016   CO2 25 11/21/2016   Lab Results  Component Value Date   CHOL 184 07/18/2013   HDL 117 (A) 07/18/2013   LDLCALC 58 07/18/2013   TRIG 46 07/18/2013      Review of Systems  Constitutional: Positive for appetite change, chills and unexpected weight change. Negative for fatigue and fever.  Respiratory: Negative for cough, shortness of breath and wheezing.   Cardiovascular: Negative for chest pain and palpitations.  Musculoskeletal: Positive for back pain.  Skin: Positive for wound (cat bite on left forearm). Negative for  color change and rash.  Neurological: Negative for dizziness and headaches.  Psychiatric/Behavioral: Positive for dysphoric mood and sleep disturbance. The patient is nervous/anxious.     Patient Active Problem List   Diagnosis Date Noted  . TIA (transient ischemic attack) 06/25/2017  . Localized primary osteoarthritis of hands, bilateral 05/02/2017  . Continuous chronic alcoholism ([place]) 09/15/2014  . Ankle inflammation 09/15/2014  . Anxiety 09/15/2014  . Cough, persistent 09/15/2014  . Smokes tobacco daily 09/15/2014  . Essential (primary) hypertension 09/15/2014  . Arthralgia of hand 09/15/2014  . Hot flash, menopausal 09/15/2014  . MI (mitral incompetence) 09/15/2014  . Chronic obstructive pulmonary emphysema ([place]) 09/15/2014  . Low weight 09/15/2014    Allergies  Allergen Reactions  . Azithromycin Nausea Only  . Codeine Nausea And Vomiting    Past Surgical History:  Procedure Laterality Date  . ABDOMINAL HYSTERECTOMY  1986    Social History   Tobacco Use  . Smoking status: Current Every Day Smoker    Packs/day: 0.50    Types: Cigarettes  . Smokeless tobacco: Never Used  Substance Use Topics  . Alcohol use: Yes    Alcohol/week: 0.0 standard drinks    Comment: 6-7 each day  . Drug use: No     Medication list has been reviewed and updated.  Current Meds  Medication Sig  . ALPRAZolam (XANAX) 0.5 MG tablet Take 0.5 mg by mouth 2 (two) times daily as needed for anxiety.  . ASPIRIN LOW DOSE 81 MG EC tablet TAKE 1 TABLET BY MOUTH ONCE DAILY  . atorvastatin (LIPITOR) 40 MG tablet TAKE 1 TABLET BY MOUTH ONCE DAILY  . ATROVENT HFA 17  MCG/ACT inhaler INHALE 2 PUFFS BY MOUTH EVERY 4 HOURS AS NEEDED FOR WHEEZING  . benazepril (LOTENSIN) 5 MG tablet TAKE 1 TABLET BY MOUTH DAILY  . CARDIZEM LA 240 MG 24 hr tablet TAKE 1 TABLET BY MOUTH EVERY DAY  . escitalopram (LEXAPRO) 5 MG tablet Take 5 mg by mouth daily.  . fluticasone (FLONASE) 50 MCG/ACT nasal spray USE 2 SPRAYS  IN EACH NOSTRIL DAILY  . nicotine (NICODERM CQ - DOSED IN MG/24 HOURS) 21 mg/24hr patch UNWRAP AND APPLY 1 PATCH TO SKIN DAILY  . PROVENTIL HFA 108 (90 Base) MCG/ACT inhaler INHALE 2 PUFFS BY MOUTH EVERY 6 HOURS AS NEEDED FOR WHEEZING OR SHORTNESS OF BREATH    PHQ 2/9 Scores 12/06/2017 11/21/2016 02/13/2015  PHQ - 2 Score 6 1 0  PHQ- 9 Score 15 - -    Physical Exam  Constitutional: She is oriented to person, place, and time. She appears well-developed. She appears cachectic. No distress.  HENT:  Head: Normocephalic and atraumatic.  Neck: Normal range of motion. Neck supple.  Cardiovascular: Normal rate, regular rhythm and normal heart sounds.  Pulmonary/Chest: Effort normal and breath sounds normal. No respiratory distress.  Musculoskeletal: She exhibits no edema.       Lumbar back: She exhibits tenderness. She exhibits no spasm.  Neurological: She is alert and oriented to person, place, and time.  Skin: Skin is warm and dry. No rash noted.     Psychiatric: She has a normal mood and affect. Her speech is normal and behavior is normal. Thought content normal.  Nursing note and vitals reviewed.   BP 130/68 (BP Location: Right Arm, Patient Position: Sitting, Cuff Size: Normal)   Pulse (!) 113   Ht 5\' 6"  (1.676 m)   Wt 83 lb (37.6 kg)   SpO2 99%   BMI 13.40 kg/m   Assessment and Plan: 1. Essential (primary) hypertension controlled - CBC with Differential/Platelet - Comprehensive metabolic panel - TSH - CARDIZEM LA 240 MG 24 hr tablet; Take 1 tablet (240 mg total) by mouth daily.  Dispense: 30 tablet; Refill: 5 - benazepril (LOTENSIN) 5 MG tablet; Take 1 tablet (5 mg total) by mouth daily.  Dispense: 30 tablet; Refill: 5  2. TIA (transient ischemic attack) Continue statin and aspirin;  needs labs - Lipid panel - atorvastatin (LIPITOR) 40 MG tablet; Take 1 tablet (40 mg total) by mouth daily.  Dispense: 30 tablet; Refill: 5  3. Other emphysema ([place]) Will try to reschedule  CT - ipratropium (ATROVENT HFA) 17 MCG/ACT inhaler; Inhale 2 puffs into the lungs every 6 (six) hours as needed for wheezing.  Dispense: 12.9 g; Refill: 5 - albuterol (PROVENTIL HFA) 108 (90 Base) MCG/ACT inhaler; Inhale 2 puffs into the lungs every 6 (six) hours as needed for wheezing or shortness of breath.  Dispense: 6.7 g; Refill: 0  4. Need for pneumococcal vaccination - Pneumococcal conjugate vaccine 13-valent IM   Meds ordered this encounter  Medications  . atorvastatin (LIPITOR) 40 MG tablet    Sig: Take 1 tablet (40 mg total) by mouth daily.    Dispense:  30 tablet    Refill:  5  . CARDIZEM LA 240 MG 24 hr tablet    Sig: Take 1 tablet (240 mg total) by mouth daily.    Dispense:  30 tablet    Refill:  5  . benazepril (LOTENSIN) 5 MG tablet    Sig: Take 1 tablet (5 mg total) by mouth daily.    Dispense:  30 tablet    Refill:  5  . ipratropium (ATROVENT HFA) 17 MCG/ACT inhaler    Sig: Inhale 2 puffs into the lungs every 6 (six) hours as needed for wheezing.    Dispense:  12.9 g    Refill:  5  . albuterol (PROVENTIL HFA) 108 (90 Base) MCG/ACT inhaler    Sig: Inhale 2 puffs into the lungs every 6 (six) hours as needed for wheezing or shortness of breath.    Dispense:  6.7 g    Refill:  0    Partially dictated using Editor, commissioning. Any errors are unintentional.  Halina Maidens, MD [facility]  12/06/2017

## 2017-12-06 NOTE — Patient Instructions (Signed)
Pneumococcal Conjugate Vaccine (PCV13) What You Need to Know 1. Why get vaccinated? Vaccination can protect both children and adults from pneumococcal disease. Pneumococcal disease is caused by bacteria that can spread from person to person through close contact. It can cause ear infections, and it can also lead to more serious infections of the:  Lungs (pneumonia),  Blood (bacteremia), and  Covering of the brain and spinal cord (meningitis).  Pneumococcal pneumonia is most common among adults. Pneumococcal meningitis can cause deafness and brain damage, and it kills about 1 child in 10 who get it. Anyone can get pneumococcal disease, but children under 2 years of age and adults 65 years and older, people with certain medical conditions, and cigarette smokers are at the highest risk. Before there was a vaccine, the [place] saw:  more than 700 cases of meningitis,  about 13,000 blood infections,  about 5 million ear infections, and  about 200 deaths  in children under 5 each year from pneumococcal disease. Since vaccine became available, severe pneumococcal disease in these children has fallen by 88%. About 18,000 older adults die of pneumococcal disease each year in the [place]. Treatment of pneumococcal infections with penicillin and other drugs is not as effective as it used to be, because some strains of the disease have become resistant to these drugs. This makes prevention of the disease, through vaccination, even more important. 2. PCV13 vaccine Pneumococcal conjugate vaccine (called PCV13) protects against 13 types of pneumococcal bacteria. PCV13 is routinely given to children at 2, 4, 6, and 12-15 months of age. It is also recommended for children and adults 2 to 64 years of age with certain health conditions, and for all adults 65 years of age and older. Your doctor can give you details. 3. Some people should not get this vaccine Anyone who has ever had a  life-threatening allergic reaction to a dose of this vaccine, to an earlier pneumococcal vaccine called PCV7, or to any vaccine containing diphtheria toxoid (for example, DTaP), should not get PCV13. Anyone with a severe allergy to any component of PCV13 should not get the vaccine. Tell your doctor if the person being vaccinated has any severe allergies. If the person scheduled for vaccination is not feeling well, your healthcare provider might decide to reschedule the shot on another day. 4. Risks of a vaccine reaction With any medicine, including vaccines, there is a chance of reactions. These are usually mild and go away on their own, but serious reactions are also possible. Problems reported following PCV13 varied by age and dose in the series. The most common problems reported among children were:  About half became drowsy after the shot, had a temporary loss of appetite, or had redness or tenderness where the shot was given.  About 1 out of 3 had swelling where the shot was given.  About 1 out of 3 had a mild fever, and about 1 in 20 had a fever over 102.2F.  Up to about 8 out of 10 became fussy or irritable.  Adults have reported pain, redness, and swelling where the shot was given; also mild fever, fatigue, headache, chills, or muscle pain. Young children who get PCV13 along with inactivated flu vaccine at the same time may be at increased risk for seizures caused by fever. Ask your doctor for more information. Problems that could happen after any vaccine:  People sometimes faint after a medical procedure, including vaccination. Sitting or lying down for about 15 minutes can help prevent   fainting, and injuries caused by a fall. Tell your doctor if you feel dizzy, or have vision changes or ringing in the ears.  Some older children and adults get severe pain in the shoulder and have difficulty moving the arm where a shot was given. This happens very rarely.  Any medication can cause a  severe allergic reaction. Such reactions from a vaccine are very rare, estimated at about 1 in a million doses, and would happen within a few minutes to a few hours after the vaccination. As with any medicine, there is a very small chance of a vaccine causing a serious injury or death. The safety of vaccines is always being monitored. For more information, visit: www.cdc.gov/vaccinesafety/ 5. What if there is a serious reaction? What should I look for? Look for anything that concerns you, such as signs of a severe allergic reaction, very high fever, or unusual behavior. Signs of a severe allergic reaction can include hives, swelling of the face and throat, difficulty breathing, a fast heartbeat, dizziness, and weakness-usually within a few minutes to a few hours after the vaccination. What should I do?  If you think it is a severe allergic reaction or other emergency that can't wait, call 9-1-1 or get the person to the nearest hospital. Otherwise, call your doctor.  Reactions should be reported to the Vaccine Adverse Event Reporting System (VAERS). Your doctor should file this report, or you can do it yourself through the VAERS web site at www.vaers.hhs.gov, or by calling 1-800-822-7967. ? VAERS does not give medical advice. 6. The National Vaccine Injury Compensation Program The National Vaccine Injury Compensation Program (VICP) is a federal program that was created to compensate people who may have been injured by certain vaccines. Persons who believe they may have been injured by a vaccine can learn about the program and about filing a claim by calling 1-800-338-2382 or visiting the VICP website at www.hrsa.gov/vaccinecompensation. There is a time limit to file a claim for compensation. 7. How can I learn more?  Ask your healthcare provider. He or she can give you the vaccine package insert or suggest other sources of information.  Call your local or state health department.  Contact the  Centers for Disease Control and Prevention (CDC): ? Call 1-800-232-4636 (1-800-CDC-INFO) or ? Visit CDC's website at www.cdc.gov/vaccines Vaccine Information Statement, PCV13 Vaccine (02/13/2014) This information is not intended to replace advice given to you by your health care provider. Make sure you discuss any questions you have with your health care provider. Document Released: 01/23/2006 Document Revised: 12/17/2015 Document Reviewed: 12/17/2015 Elsevier Interactive Patient Education  2017 Elsevier Inc.  

## 2017-12-07 LAB — LIPID PANEL
CHOLESTEROL TOTAL: 174 mg/dL (ref 100–199)
Chol/HDL Ratio: 1.4 ratio (ref 0.0–4.4)
HDL: 125 mg/dL (ref >39)
LDL CALC: 39 mg/dL (ref 0–99)
Triglycerides: 51 mg/dL (ref 0–149)
VLDL Cholesterol Cal: 10 mg/dL (ref 5–40)

## 2017-12-07 LAB — CBC WITH DIFFERENTIAL/PLATELET
BASOS ABS: 0.1 10*3/uL (ref 0.0–0.2)
Basos: 1 %
EOS (ABSOLUTE): 0.1 10*3/uL (ref 0.0–0.4)
Eos: 1 %
Hematocrit: 38.5 % (ref 34.0–46.6)
Hemoglobin: 13 g/dL (ref 11.1–15.9)
IMMATURE GRANULOCYTES: 0 %
Immature Grans (Abs): 0 10*3/uL (ref 0.0–0.1)
LYMPHS ABS: 2.2 10*3/uL (ref 0.7–3.1)
Lymphs: 27 %
MCH: 30.8 pg (ref 26.6–33.0)
MCHC: 33.8 g/dL (ref 31.5–35.7)
MCV: 91 fL (ref 79–97)
Monocytes Absolute: 0.8 10*3/uL (ref 0.1–0.9)
Monocytes: 10 %
NEUTROS PCT: 61 %
Neutrophils Absolute: 5 10*3/uL (ref 1.4–7.0)
Platelets: 356 10*3/uL (ref 150–450)
RBC: 4.22 x10E6/uL (ref 3.77–5.28)
RDW: 12 % — ABNORMAL LOW (ref 12.3–15.4)
WBC: 8.2 10*3/uL (ref 3.4–10.8)

## 2017-12-07 LAB — TSH: TSH: 0.806 u[IU]/mL (ref 0.450–4.500)

## 2017-12-07 LAB — COMPREHENSIVE METABOLIC PANEL
ALBUMIN: 4.6 g/dL (ref 3.5–5.5)
ALT: 35 IU/L — ABNORMAL HIGH (ref 0–32)
AST: 30 IU/L (ref 0–40)
Albumin/Globulin Ratio: 1.8 (ref 1.2–2.2)
Alkaline Phosphatase: 113 IU/L (ref 39–117)
BILIRUBIN TOTAL: 0.7 mg/dL (ref 0.0–1.2)
BUN / CREAT RATIO: 11 (ref 9–23)
BUN: 7 mg/dL (ref 6–24)
CHLORIDE: 96 mmol/L (ref 96–106)
CO2: 23 mmol/L (ref 20–29)
CREATININE: 0.62 mg/dL (ref 0.57–1.00)
Calcium: 9.5 mg/dL (ref 8.7–10.2)
GFR calc non Af Amer: 100 mL/min/{1.73_m2} (ref >59)
GFR, EST AFRICAN AMERICAN: 116 mL/min/{1.73_m2} (ref >59)
GLOBULIN, TOTAL: 2.6 g/dL (ref 1.5–4.5)
GLUCOSE: 95 mg/dL (ref 65–99)
Potassium: 4.7 mmol/L (ref 3.5–5.2)
SODIUM: 136 mmol/L (ref 134–144)
TOTAL PROTEIN: 7.2 g/dL (ref 6.0–8.5)

## 2017-12-12 ENCOUNTER — Other Ambulatory Visit: Payer: Self-pay | Admitting: Internal Medicine

## 2017-12-12 DIAGNOSIS — J438 Other emphysema: Secondary | ICD-10-CM

## 2017-12-12 DIAGNOSIS — F17200 Nicotine dependence, unspecified, uncomplicated: Secondary | ICD-10-CM

## 2017-12-29 ENCOUNTER — Ambulatory Visit
Admission: RE | Admit: 2017-12-29 | Discharge: 2017-12-29 | Disposition: A | Discharge status: Home / Self Care | Payer: Medicaid Other | Source: Ambulatory Visit | Attending: Internal Medicine | Admitting: Internal Medicine

## 2017-12-29 DIAGNOSIS — I700 Atherosclerosis of aorta: Secondary | ICD-10-CM | POA: Insufficient documentation

## 2017-12-29 DIAGNOSIS — J432 Centrilobular emphysema: Secondary | ICD-10-CM | POA: Diagnosis not present

## 2017-12-29 DIAGNOSIS — J984 Other disorders of lung: Secondary | ICD-10-CM | POA: Insufficient documentation

## 2017-12-29 DIAGNOSIS — R9389 Abnormal findings on diagnostic imaging of other specified body structures: Secondary | ICD-10-CM | POA: Diagnosis present

## 2017-12-31 ENCOUNTER — Encounter: Payer: Self-pay | Admitting: Internal Medicine

## 2017-12-31 DIAGNOSIS — I700 Atherosclerosis of aorta: Secondary | ICD-10-CM | POA: Insufficient documentation

## 2018-01-07 ENCOUNTER — Other Ambulatory Visit: Payer: Self-pay | Admitting: Internal Medicine

## 2018-01-07 DIAGNOSIS — J438 Other emphysema: Secondary | ICD-10-CM

## 2018-01-15 ENCOUNTER — Other Ambulatory Visit: Payer: Self-pay | Admitting: Internal Medicine

## 2018-01-15 DIAGNOSIS — F17200 Nicotine dependence, unspecified, uncomplicated: Secondary | ICD-10-CM

## 2018-02-09 ENCOUNTER — Ambulatory Visit: Payer: Medicaid Other | Admitting: Internal Medicine

## 2018-02-12 ENCOUNTER — Other Ambulatory Visit: Payer: Self-pay | Admitting: Internal Medicine

## 2018-02-12 DIAGNOSIS — J438 Other emphysema: Secondary | ICD-10-CM

## 2018-02-12 DIAGNOSIS — F17200 Nicotine dependence, unspecified, uncomplicated: Secondary | ICD-10-CM

## 2018-03-02 ENCOUNTER — Ambulatory Visit: Payer: Medicaid Other

## 2018-03-16 ENCOUNTER — Ambulatory Visit: Payer: Medicaid Other

## 2018-03-21 ENCOUNTER — Other Ambulatory Visit: Payer: Self-pay | Admitting: Internal Medicine

## 2018-03-21 DIAGNOSIS — J438 Other emphysema: Secondary | ICD-10-CM

## 2018-04-09 ENCOUNTER — Encounter: Payer: Self-pay | Admitting: Internal Medicine

## 2018-04-09 ENCOUNTER — Ambulatory Visit (INDEPENDENT_AMBULATORY_CARE_PROVIDER_SITE_OTHER): Payer: Medicaid Other | Admitting: Internal Medicine

## 2018-04-09 VITALS — BP 132/68 | HR 93 | Temp 98.8°F | Ht 66.0 in | Wt 85.0 lb

## 2018-04-09 DIAGNOSIS — L089 Local infection of the skin and subcutaneous tissue, unspecified: Secondary | ICD-10-CM

## 2018-04-09 DIAGNOSIS — L723 Sebaceous cyst: Secondary | ICD-10-CM

## 2018-04-09 DIAGNOSIS — Z23 Encounter for immunization: Secondary | ICD-10-CM

## 2018-04-09 MED ORDER — AMOXICILLIN-POT CLAVULANATE 875-125 MG PO TABS: 1.0000 | ORAL_TABLET | Freq: Two times a day (BID) | ORAL | 0 refills | Status: AC

## 2018-04-09 MED ORDER — NAPROXEN 250 MG PO TABS: 250.0000 mg | ORAL_TABLET | Freq: Two times a day (BID) | ORAL | 0 refills | Status: DC

## 2018-04-09 NOTE — Progress Notes (Signed)
Date:  04/09/2018   Name:  Kendra Mullins   DOB:  06/27/1960   MRN:  [id number]   Chief Complaint: Neck Pain (Right sided neck pain and swelling. Started a little over a week ago. Better than it was. Was going to seee UC but Holiday was busy and stayed home. Took Ibuprofen and NAproxen and it did help. Know on right side of neck still present but was much worse. Still painful but not as bad as when started. )  HPI Pt noticed right sided swelling of her neck about a week ago.  Previously there was a small cyst.  She used Aleve and heat and the swelling is less.  It is painful to touch and if she turns her head.  No fever, chills, trouble swallowing, choking, cough, ear or sinus pain.  Review of Systems  Constitutional: Negative for chills, fatigue and fever.  Respiratory: Negative for cough, chest tightness, shortness of breath and wheezing.   Cardiovascular: Negative for chest pain, palpitations and leg swelling.  Musculoskeletal: Positive for neck pain.  Neurological: Negative for dizziness, light-headedness and headaches.    Patient Active Problem List   Diagnosis Date Noted  . Aortic atherosclerosis ([place]) 12/31/2017  . TIA (transient ischemic attack) 06/25/2017  . Localized primary osteoarthritis of hands, bilateral 05/02/2017  . Continuous chronic alcoholism ([place]) 09/15/2014  . Ankle inflammation 09/15/2014  . Anxiety 09/15/2014  . Cough, persistent 09/15/2014  . Smokes tobacco daily 09/15/2014  . Essential (primary) hypertension 09/15/2014  . Hot flash, menopausal 09/15/2014  . MI (mitral incompetence) 09/15/2014  . Centrilobular emphysema ([place]) 09/15/2014  . Low weight 09/15/2014    Allergies  Allergen Reactions  . Azithromycin Nausea Only  . Codeine Nausea And Vomiting    Past Surgical History:  Procedure Laterality Date  . ABDOMINAL HYSTERECTOMY  1986    Social History   Tobacco Use  . Smoking status: Current Every Day Smoker    Packs/day: 0.50   Types: Cigarettes  . Smokeless tobacco: Never Used  . Tobacco comment: using generic nicodern cq patches to try to quit  Substance Use Topics  . Alcohol use: Yes    Alcohol/week: 0.0 standard drinks    Comment: 6-7 each day  . Drug use: No     Medication list has been reviewed and updated.  Current Meds  Medication Sig  . ALPRAZolam (XANAX) 0.5 MG tablet Take 0.5 mg by mouth 2 (two) times daily as needed for anxiety.  . ASPIRIN LOW DOSE 81 MG EC tablet TAKE 1 TABLET BY MOUTH ONCE DAILY  . atorvastatin (LIPITOR) 40 MG tablet Take 1 tablet (40 mg total) by mouth daily.  . benazepril (LOTENSIN) 5 MG tablet Take 1 tablet (5 mg total) by mouth daily.  Marland Kitchen CARDIZEM LA 240 MG 24 hr tablet Take 1 tablet (240 mg total) by mouth daily.  . fluticasone (FLONASE) 50 MCG/ACT nasal spray PLACE 2 SPRAYS IN EACH NOSTRIL DAILY.  Marland Kitchen ipratropium (ATROVENT HFA) 17 MCG/ACT inhaler Inhale 2 puffs into the lungs every 6 (six) hours as needed for wheezing.  . naproxen (NAPROSYN) 250 MG tablet Take by mouth 2 (two) times daily with a meal.  . nicotine (NICODERM CQ - DOSED IN MG/24 HOURS) 14 mg/24hr patch APPLY 1 PATCH TO THE SKIN DAILY  . PROAIR HFA 108 (90 Base) MCG/ACT inhaler INHALE 2 PUFFS BY MOUTH EVERY 6 HOURS AS NEEDED FOR WHEEZING OR SHORTNESS OF BREATH  . PROVENTIL HFA 108 (90 Base) MCG/ACT inhaler  INHALE 2 PUFFS BY MOUTH EVERY 6 HOURS AS NEEDED FOR WHEEZING OR SHORTNESS OF BREATH  . sertraline (ZOLOFT) 25 MG tablet Take 25 mg by mouth daily.    PHQ 2/9 Scores 12/06/2017 11/21/2016 02/13/2015  PHQ - 2 Score 6 1 0  PHQ- 9 Score 15 - -    Physical Exam Wt Readings from Last 3 Encounters:  04/09/18 85 lb (38.6 kg)  12/06/17 83 lb (37.6 kg)  07/03/17 86 lb (39 kg)    BP 132/68 (BP Location: Right Arm, Patient Position: Sitting, Cuff Size: Normal)   Pulse 93   Ht 5\' 6"  (1.676 m)   Wt 85 lb (38.6 kg)   SpO2 98%   BMI 13.72 kg/m   Assessment and Plan: 1. Infected sebaceous cyst May need I&D if  no improvement with antibiotics Continue warm compresses - CBC with Differential/Platelet - amoxicillin-clavulanate (AUGMENTIN) 875-125 MG tablet; Take 1 tablet by mouth 2 (two) times daily for 10 days.  Dispense: 20 tablet; Refill: 0 - naproxen (NAPROSYN) 250 MG tablet; Take 1 tablet (250 mg total) by mouth 2 (two) times daily with a meal.  Dispense: 60 tablet; Refill: 0   Partially dictated using Editor, commissioning. Any errors are unintentional.  Halina Maidens, MD [facility]  04/09/2018

## 2018-04-10 ENCOUNTER — Other Ambulatory Visit: Payer: Self-pay | Admitting: Internal Medicine

## 2018-04-10 DIAGNOSIS — J438 Other emphysema: Secondary | ICD-10-CM

## 2018-04-10 LAB — CBC WITH DIFFERENTIAL/PLATELET
BASOS ABS: 0.1 10*3/uL (ref 0.0–0.2)
Basos: 1 %
EOS (ABSOLUTE): 0.1 10*3/uL (ref 0.0–0.4)
Eos: 2 %
Hematocrit: 38.1 % (ref 34.0–46.6)
Hemoglobin: 13.7 g/dL (ref 11.1–15.9)
Immature Grans (Abs): 0 10*3/uL (ref 0.0–0.1)
Immature Granulocytes: 0 %
LYMPHS: 26 %
Lymphocytes Absolute: 2.1 10*3/uL (ref 0.7–3.1)
MCH: 32.9 pg (ref 26.6–33.0)
MCHC: 36 g/dL — ABNORMAL HIGH (ref 31.5–35.7)
MCV: 92 fL (ref 79–97)
Monocytes Absolute: 0.9 10*3/uL (ref 0.1–0.9)
Monocytes: 11 %
Neutrophils Absolute: 4.7 10*3/uL (ref 1.4–7.0)
Neutrophils: 60 %
PLATELETS: 386 10*3/uL (ref 150–450)
RBC: 4.16 x10E6/uL (ref 3.77–5.28)
RDW: 11.7 % — ABNORMAL LOW (ref 12.3–15.4)
WBC: 7.8 10*3/uL (ref 3.4–10.8)

## 2018-04-20 ENCOUNTER — Other Ambulatory Visit: Payer: Self-pay | Admitting: Internal Medicine

## 2018-05-23 ENCOUNTER — Other Ambulatory Visit: Payer: Self-pay | Admitting: Internal Medicine

## 2018-05-23 DIAGNOSIS — J438 Other emphysema: Secondary | ICD-10-CM

## 2018-06-09 ENCOUNTER — Other Ambulatory Visit: Payer: Self-pay | Admitting: Internal Medicine

## 2018-06-09 DIAGNOSIS — J438 Other emphysema: Secondary | ICD-10-CM

## 2018-06-12 ENCOUNTER — Other Ambulatory Visit: Payer: Self-pay | Admitting: Internal Medicine

## 2018-06-12 DIAGNOSIS — I10 Essential (primary) hypertension: Secondary | ICD-10-CM

## 2018-06-12 DIAGNOSIS — J438 Other emphysema: Secondary | ICD-10-CM

## 2018-06-21 ENCOUNTER — Other Ambulatory Visit: Payer: Self-pay | Admitting: Internal Medicine

## 2018-06-21 DIAGNOSIS — J438 Other emphysema: Secondary | ICD-10-CM

## 2018-07-03 ENCOUNTER — Other Ambulatory Visit: Payer: Self-pay | Admitting: Internal Medicine

## 2018-07-03 DIAGNOSIS — I10 Essential (primary) hypertension: Secondary | ICD-10-CM

## 2018-07-13 ENCOUNTER — Telehealth: Payer: Self-pay

## 2018-07-13 NOTE — Telephone Encounter (Signed)
Patient calling requesting letter from Dr Kendra Mullins to state she is high risk for COVID- 19 and her essential working husband cannot work because she is high risk and could get it and die.  Told we cannot give her a letter to state this. Told her to just make sure they are both taking precautions.  She verbalized understanding.

## 2018-07-21 ENCOUNTER — Other Ambulatory Visit: Payer: Self-pay | Admitting: Internal Medicine

## 2018-07-21 DIAGNOSIS — J438 Other emphysema: Secondary | ICD-10-CM

## 2018-08-17 ENCOUNTER — Other Ambulatory Visit: Payer: Self-pay | Admitting: Internal Medicine

## 2018-08-17 DIAGNOSIS — J438 Other emphysema: Secondary | ICD-10-CM

## 2018-09-12 ENCOUNTER — Telehealth: Payer: Self-pay

## 2018-09-12 NOTE — Telephone Encounter (Signed)
Patient called saying she thinks she may have had a allergic reaction to something over the weekend. She has been itching non stop. She said she now has scabs on her back and scalp from scratching so much.  Wants medication to help with itching.  Called and left a VM informing her she needs an appt before we can prescribe medications. Asked her to call the office back and schedule an appt to be seen.

## 2018-10-09 ENCOUNTER — Other Ambulatory Visit: Payer: Self-pay | Admitting: Internal Medicine

## 2018-10-09 DIAGNOSIS — G459 Transient cerebral ischemic attack, unspecified: Secondary | ICD-10-CM

## 2018-10-11 DIAGNOSIS — F331 Major depressive disorder, recurrent, moderate: Secondary | ICD-10-CM | POA: Diagnosis not present

## 2018-10-11 DIAGNOSIS — F410 Panic disorder [episodic paroxysmal anxiety] without agoraphobia: Secondary | ICD-10-CM | POA: Diagnosis not present

## 2018-11-06 ENCOUNTER — Encounter: Payer: Medicaid Other | Admitting: Internal Medicine

## 2018-11-09 ENCOUNTER — Other Ambulatory Visit: Payer: Self-pay | Admitting: Internal Medicine

## 2018-11-09 DIAGNOSIS — I10 Essential (primary) hypertension: Secondary | ICD-10-CM

## 2018-11-09 DIAGNOSIS — J438 Other emphysema: Secondary | ICD-10-CM

## 2018-12-07 ENCOUNTER — Other Ambulatory Visit: Payer: Self-pay | Admitting: Internal Medicine

## 2018-12-07 DIAGNOSIS — I10 Essential (primary) hypertension: Secondary | ICD-10-CM

## 2018-12-07 DIAGNOSIS — G459 Transient cerebral ischemic attack, unspecified: Secondary | ICD-10-CM

## 2018-12-07 DIAGNOSIS — J438 Other emphysema: Secondary | ICD-10-CM

## 2019-02-01 ENCOUNTER — Ambulatory Visit: Payer: Medicaid Other

## 2019-02-14 ENCOUNTER — Ambulatory Visit: Payer: Medicaid Other

## 2019-02-28 DIAGNOSIS — F331 Major depressive disorder, recurrent, moderate: Secondary | ICD-10-CM | POA: Diagnosis not present

## 2019-02-28 DIAGNOSIS — Z79899 Other long term (current) drug therapy: Secondary | ICD-10-CM | POA: Diagnosis not present

## 2019-02-28 DIAGNOSIS — F410 Panic disorder [episodic paroxysmal anxiety] without agoraphobia: Secondary | ICD-10-CM | POA: Diagnosis not present

## 2019-03-01 ENCOUNTER — Ambulatory Visit: Payer: Medicaid Other

## 2019-03-08 ENCOUNTER — Other Ambulatory Visit: Payer: Self-pay | Admitting: Internal Medicine

## 2019-03-08 DIAGNOSIS — I10 Essential (primary) hypertension: Secondary | ICD-10-CM

## 2019-03-10 ENCOUNTER — Other Ambulatory Visit: Payer: Self-pay | Admitting: Internal Medicine

## 2019-03-12 ENCOUNTER — Telehealth: Payer: Self-pay

## 2019-03-12 NOTE — Telephone Encounter (Signed)
Completed PA over the phone with patients insurance for Cardizem LA 240 mg tablets. 30 tabs for 30 days.   PA# [id number]  They sent the PA for review and will respond with a outcome within 24 hours.   Awaiting response from insurance.

## 2019-03-13 NOTE — Telephone Encounter (Signed)
Please disregard previous note about [employer]- documented in wrong PA.   Benedict Needy, CMA

## 2019-03-13 NOTE — Telephone Encounter (Signed)
Received a call from [facility] saying PA was partially approved for #18 for 30 days. If patient needs #24 for 30 days we have to call and do a peer to peer. Will call today and submit this over the phone.

## 2019-03-28 ENCOUNTER — Encounter: Payer: Medicaid Other | Admitting: Internal Medicine

## 2019-03-28 ENCOUNTER — Encounter: Payer: Self-pay | Admitting: Internal Medicine

## 2019-03-28 NOTE — Progress Notes (Deleted)
Date:  03/28/2019   Name:  Lakiya Cottam Mullins   DOB:  [birth year]   MRN:  [id number]   Chief Complaint: No chief complaint on file. Kendra Mullins is a 58 y.o. female who presents today for her Complete Annual Exam. She feels {DESC; WELL/FAIRLY WELL/POORLY:18703}. She reports exercising ***. She reports she is sleeping {DESC; WELL/FAIRLY WELL/POORLY:18703}.   Mammogram - none Colonoscopy - none Pap - discontinued Flu vaccine due Immunization History  Administered Date(s) Administered  . Influenza, Seasonal, Injecte, Preservative Fre 02/18/2011, 04/25/2012  . Influenza,inj,Quad PF,6+ Mos 02/12/2013, 02/14/2014, 02/13/2015, 04/26/2016, 03/09/2017, 04/09/2018  . Influenza-Unspecified 02/09/2017  . Pneumococcal Conjugate-13 12/06/2017  . Pneumococcal Polysaccharide-23 08/31/2010  . Pneumococcal-Unspecified 08/31/2010    HPI  Lab Results  Component Value Date   CREATININE 0.62 12/06/2017   BUN 7 12/06/2017   NA 136 12/06/2017   K 4.7 12/06/2017   CL 96 12/06/2017   CO2 23 12/06/2017   Lab Results  Component Value Date   CHOL 174 12/06/2017   HDL 125 12/06/2017   LDLCALC 39 12/06/2017   TRIG 51 12/06/2017   CHOLHDL 1.4 12/06/2017   Lab Results  Component Value Date   TSH 0.806 12/06/2017   No results found for: HGBA1C   Review of Systems  Constitutional: Negative for chills, fatigue and fever.  HENT: Negative for congestion, hearing loss, tinnitus, trouble swallowing and voice change.   Eyes: Negative for visual disturbance.  Respiratory: Negative for cough, chest tightness, shortness of breath and wheezing.   Cardiovascular: Negative for chest pain, palpitations and leg swelling.  Gastrointestinal: Negative for abdominal pain, constipation, diarrhea and vomiting.  Endocrine: Negative for polydipsia and polyuria.  Genitourinary: Negative for dysuria, frequency, genital sores, vaginal bleeding and vaginal discharge.  Musculoskeletal: Negative for arthralgias,  gait problem and joint swelling.  Skin: Negative for color change and rash.  Neurological: Negative for dizziness, tremors, light-headedness and headaches.  Hematological: Negative for adenopathy. Does not bruise/bleed easily.  Psychiatric/Behavioral: Negative for dysphoric mood and sleep disturbance. The patient is not nervous/anxious.     Patient Active Problem List   Diagnosis Date Noted  . Aortic atherosclerosis ([place]) 12/31/2017  . TIA (transient ischemic attack) 06/25/2017  . Localized primary osteoarthritis of hands, bilateral 05/02/2017  . Continuous chronic alcoholism ([place]) 09/15/2014  . Anxiety 09/15/2014  . Cough, persistent 09/15/2014  . Smokes tobacco daily 09/15/2014  . Essential (primary) hypertension 09/15/2014  . Hot flash, menopausal 09/15/2014  . MI (mitral incompetence) 09/15/2014  . Centrilobular emphysema ([place]) 09/15/2014  . Low weight 09/15/2014    Allergies  Allergen Reactions  . Azithromycin Nausea Only  . Codeine Nausea And Vomiting    Past Surgical History:  Procedure Laterality Date  . ABDOMINAL HYSTERECTOMY  1986    Social History   Tobacco Use  . Smoking status: Current Every Day Smoker    Packs/day: 0.50    Types: Cigarettes  . Smokeless tobacco: Never Used  . Tobacco comment: using generic nicodern cq patches to try to quit  Substance Use Topics  . Alcohol use: Yes    Alcohol/week: 0.0 standard drinks    Comment: 6-7 each day  . Drug use: No     Medication list has been reviewed and updated.  No outpatient medications have been marked as taking for the 03/28/19 encounter (Appointment) with Glean Hess, MD.    [facility] 2/9 Scores 12/06/2017 11/21/2016 02/13/2015  PHQ - 2 Score 6 1 0  PHQ- 9 Score 15 - -  BP Readings from Last 3 Encounters:  04/09/18 132/68  12/06/17 130/68  07/03/17 (!) 144/90    Physical Exam Vitals and nursing note reviewed.  Constitutional:      General: She is not in acute distress.    Appearance:  She is well-developed.  HENT:     Head: Normocephalic and atraumatic.     Right Ear: Tympanic membrane and ear canal normal.     Left Ear: Tympanic membrane and ear canal normal.     Nose:     Right Sinus: No maxillary sinus tenderness.     Left Sinus: No maxillary sinus tenderness.  Eyes:     General: No scleral icterus.       Right eye: No discharge.        Left eye: No discharge.     Conjunctiva/sclera: Conjunctivae normal.  Neck:     Thyroid: No thyromegaly.     Vascular: No carotid bruit.  Cardiovascular:     Rate and Rhythm: Normal rate and regular rhythm.     Pulses: Normal pulses.     Heart sounds: Normal heart sounds.  Pulmonary:     Effort: Pulmonary effort is normal. No respiratory distress.     Breath sounds: No wheezing.  Chest:     Breasts:        Right: No mass, nipple discharge, skin change or tenderness.        Left: No mass, nipple discharge, skin change or tenderness.  Abdominal:     General: Bowel sounds are normal.     Palpations: Abdomen is soft.     Tenderness: There is no abdominal tenderness.  Musculoskeletal:        General: Normal range of motion.     Cervical back: Normal range of motion. No erythema.  Lymphadenopathy:     Cervical: No cervical adenopathy.  Skin:    General: Skin is warm and dry.     Findings: No rash.  Neurological:     Mental Status: She is alert and oriented to person, place, and time.     Cranial Nerves: No cranial nerve deficit.     Sensory: No sensory deficit.     Deep Tendon Reflexes: Reflexes are normal and symmetric.  Psychiatric:        Speech: Speech normal.        Behavior: Behavior normal.        Thought Content: Thought content normal.     Wt Readings from Last 3 Encounters:  04/09/18 85 lb (38.6 kg)  12/06/17 83 lb (37.6 kg)  07/03/17 86 lb (39 kg)    There were no vitals taken for this visit.  Assessment and Plan:

## 2019-04-16 ENCOUNTER — Encounter: Payer: Self-pay | Admitting: Internal Medicine

## 2019-04-16 ENCOUNTER — Other Ambulatory Visit: Payer: Self-pay

## 2019-04-16 ENCOUNTER — Ambulatory Visit (INDEPENDENT_AMBULATORY_CARE_PROVIDER_SITE_OTHER): Payer: Medicaid Other | Admitting: Internal Medicine

## 2019-04-16 VITALS — Temp 100.0°F | Ht 66.0 in | Wt 85.0 lb

## 2019-04-16 DIAGNOSIS — J441 Chronic obstructive pulmonary disease with (acute) exacerbation: Secondary | ICD-10-CM | POA: Diagnosis not present

## 2019-04-16 DIAGNOSIS — U071 COVID-19: Secondary | ICD-10-CM

## 2019-04-16 MED ORDER — DOXYCYCLINE HYCLATE 100 MG PO TABS: 100.0000 mg | ORAL_TABLET | Freq: Two times a day (BID) | ORAL | 0 refills | Status: AC

## 2019-04-16 MED ORDER — PREDNISONE 10 MG PO TABS: 10.0000 mg | ORAL_TABLET | ORAL | 0 refills | Status: AC

## 2019-04-16 NOTE — Progress Notes (Signed)
Date:  04/16/2019   Name:  Kendra Mullins   DOB:  [birth year]   MRN:  [id number]  I connected with this patient, Kendra Mullins, by telephone at the patient's home.  I verified that I am speaking with the correct person using two identifiers. This visit was conducted via telephone due to the Covid-19 outbreak from my office at [facility] in [place], [place]. I discussed the limitations, risks, security and privacy concerns of performing an evaluation and management service by telephone. I also discussed with the patient that there may be a patient responsible charge related to this service. The patient expressed understanding and agreed to proceed.   Chief Complaint: Sore Throat (Congestion, chills, and sore throat. Fever. Fever has been up to 100.4 Coughing- with little mucous. Feels like all in her face and she is concerned it will travel to chest. )  Sore Throat  This is a new problem. The current episode started in the past 7 days. The problem has been gradually worsening. The maximum temperature recorded prior to her arrival was 100.4 - 100.9 F. The pain is moderate. Associated symptoms include abdominal pain, congestion, coughing, diarrhea, headaches, a hoarse voice and shortness of breath. Pertinent negatives include no vomiting. Associated symptoms comments: Loss of taste.  Multiple family members have been sick with Covid since Christmas and she has been in contact with several of them.  Lab Results  Component Value Date   CREATININE 0.62 12/06/2017   BUN 7 12/06/2017   NA 136 12/06/2017   K 4.7 12/06/2017   CL 96 12/06/2017   CO2 23 12/06/2017   Lab Results  Component Value Date   CHOL 174 12/06/2017   HDL 125 12/06/2017   LDLCALC 39 12/06/2017   TRIG 51 12/06/2017   CHOLHDL 1.4 12/06/2017   Lab Results  Component Value Date   TSH 0.806 12/06/2017   No results found for: HGBA1C   Review of Systems  Constitutional: Positive for chills, fatigue and fever.    HENT: Positive for congestion, hoarse voice and sore throat.   Respiratory: Positive for cough, chest tightness and shortness of breath.   Cardiovascular: Negative for chest pain, palpitations and leg swelling.  Gastrointestinal: Positive for abdominal pain and diarrhea. Negative for constipation and vomiting.  Neurological: Positive for headaches. Negative for dizziness and light-headedness.  Psychiatric/Behavioral: Negative for dysphoric mood and sleep disturbance.    Patient Active Problem List   Diagnosis Date Noted  . Aortic atherosclerosis ([place]) 12/31/2017  . TIA (transient ischemic attack) 06/25/2017  . Localized primary osteoarthritis of hands, bilateral 05/02/2017  . Continuous chronic alcoholism ([place]) 09/15/2014  . Anxiety 09/15/2014  . Cough, persistent 09/15/2014  . Smokes tobacco daily 09/15/2014  . Essential (primary) hypertension 09/15/2014  . Hot flash, menopausal 09/15/2014  . MI (mitral incompetence) 09/15/2014  . Centrilobular emphysema ([place]) 09/15/2014  . Low weight 09/15/2014    Allergies  Allergen Reactions  . Azithromycin Nausea Only  . Codeine Nausea And Vomiting    Past Surgical History:  Procedure Laterality Date  . ABDOMINAL HYSTERECTOMY  1986    Social History   Tobacco Use  . Smoking status: Current Every Day Smoker    Packs/day: 0.50    Types: Cigarettes  . Smokeless tobacco: Never Used  . Tobacco comment: using generic nicodern cq patches to try to quit  Substance Use Topics  . Alcohol use: Yes    Alcohol/week: 0.0 standard drinks    Comment: 6-7 each  day  . Drug use: No     Medication list has been reviewed and updated.  Current Meds  Medication Sig  . ALPRAZolam (XANAX) 0.5 MG tablet Take 0.5 mg by mouth 2 (two) times daily as needed for anxiety.  . ASPIRIN LOW DOSE 81 MG EC tablet TAKE 1 TABLET BY MOUTH ONCE DAILY  . atorvastatin (LIPITOR) 40 MG tablet TAKE 1 TABLET BY MOUTH ONCE DAILY  . ATROVENT HFA 17 MCG/ACT inhaler  INHALE 2 PUFFS INTO THE LUNGS EVERY 6 HOURS AS NEEDED FOR WHEEZING  . benazepril (LOTENSIN) 5 MG tablet TAKE 1 TABLET BY MOUTH DAILY  . CARDIZEM LA 240 MG 24 hr tablet TAKE 1 TABLET BY MOUTH DAILY  . fluticasone (FLONASE) 50 MCG/ACT nasal spray USE 2 SPRAYS IN EACH NOSTRIL DAILY  . nicotine (NICODERM CQ - DOSED IN MG/24 HOURS) 14 mg/24hr patch APPLY 1 PATCH TO THE SKIN DAILY  . PROAIR HFA 108 (90 Base) MCG/ACT inhaler INHALE 2 PUFFS INTO THE LUNGS EVERY 6 HOURS AS NEEDED FOR WHEEZING OR SHORTNESS OF BREATH  . PROVENTIL HFA 108 (90 Base) MCG/ACT inhaler INHALE 2 PUFFS BY MOUTH EVERY 6 HOURS AS NEEDED FOR WHEEZING OR SHORTNESS OF BREATH  . sertraline (ZOLOFT) 25 MG tablet Take 25 mg by mouth daily.    PHQ 2/9 Scores 04/16/2019 12/06/2017 11/21/2016 02/13/2015  PHQ - 2 Score 2 6 1  0  PHQ- 9 Score 2 15 - -    BP Readings from Last 3 Encounters:  04/09/18 132/68  12/06/17 130/68  07/03/17 (!) 144/90    Physical Exam Pulmonary:     Comments: Cough and mild dyspnea noted during the call Neurological:     Mental Status: She is alert.  Psychiatric:        Attention and Perception: Attention normal.        Mood and Affect: Mood normal.        Speech: Speech normal.        Cognition and Memory: Cognition normal.     Wt Readings from Last 3 Encounters:  04/16/19 85 lb (38.6 kg)  04/09/18 85 lb (38.6 kg)  12/06/17 83 lb (37.6 kg)    Temp 100 F (37.8 C) (Oral)   Ht 5\' 6"  (1.676 m)   Wt 85 lb (38.6 kg)   BMI 13.72 kg/m   Assessment and Plan: 1. COVID-19 virus infection Highly suspected due to family exposure Continue Zinc, Vit D, Vit C, Multi-vitamin Tylenol 1000 mg every 6 hours for fever Instructions for drive by testing appointment are given  2. 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  Dispense: 20 tablet;  Refill: 0 - predniSONE (DELTASONE) 10 MG tablet; Take 1 tablet (10 mg total) by mouth as directed for 6 days. Take 6,5,4,3,2,1 then stop  Dispense: 21 tablet; Refill: 0   Partially dictated using Editor, commissioning. Any errors are unintentional.  Halina Maidens, MD [facility]  04/16/2019

## 2019-04-17 ENCOUNTER — Telehealth: Payer: Self-pay

## 2019-04-17 ENCOUNTER — Other Ambulatory Visit: Payer: Self-pay | Admitting: Internal Medicine

## 2019-04-17 ENCOUNTER — Ambulatory Visit: Payer: Medicaid Other | Attending: Internal Medicine

## 2019-04-17 DIAGNOSIS — J441 Chronic obstructive pulmonary disease with (acute) exacerbation: Secondary | ICD-10-CM

## 2019-04-17 DIAGNOSIS — Z20822 Contact with and (suspected) exposure to covid-19: Secondary | ICD-10-CM

## 2019-04-17 MED ORDER — BENZONATATE 100 MG PO CAPS: 100.0000 mg | ORAL_CAPSULE | Freq: Three times a day (TID) | ORAL | 0 refills | Status: AC

## 2019-04-17 NOTE — Telephone Encounter (Signed)
I sent in Tessalon perles.  She is allergic to codeine.

## 2019-04-17 NOTE — Telephone Encounter (Signed)
Patient called requesting medication for her cough. She was tested for Covid today and no results are back yet.  Please advise.

## 2019-04-18 LAB — NOVEL CORONAVIRUS, NAA: SARS-CoV-2, NAA: DETECTED — AB

## 2019-04-18 NOTE — Telephone Encounter (Signed)
Patient informed. 

## 2019-04-19 ENCOUNTER — Telehealth: Payer: Self-pay

## 2019-04-19 ENCOUNTER — Telehealth: Payer: Self-pay | Admitting: *Deleted

## 2019-04-19 ENCOUNTER — Telehealth: Payer: Self-pay | Admitting: Physician Assistant

## 2019-04-19 ENCOUNTER — Ambulatory Visit: Payer: Self-pay

## 2019-04-19 NOTE — Telephone Encounter (Signed)
See lab note for call about Covid-19 test result.

## 2019-04-19 NOTE — Telephone Encounter (Signed)
Attempted to call patient to discuss positive covid 19 test. She would qualify for monoclonal antibody infusion if she has symptoms given age and COPD. Phone continued to ring and I was unable to leave a message.   Angelena Form PA-C  [facility]

## 2019-04-19 NOTE — Telephone Encounter (Signed)
Attempted to call patient to go over her covid 19 test result. No answer and unable to leave a message. Pt does not have MyChart set up.

## 2019-04-20 ENCOUNTER — Telehealth: Payer: Self-pay | Admitting: Unknown Physician Specialty

## 2019-04-20 NOTE — Telephone Encounter (Signed)
Called to discuss with patient about Covid symptoms and the use of bamlanivimab, a monoclonal antibody infusion for those with mild to moderate Covid symptoms and at a high risk of hospitalization.  Pt is qualified for this infusion at the [facility] infusion center due to Age >55 and COPD   Message left to call back

## 2019-05-08 ENCOUNTER — Other Ambulatory Visit: Payer: Self-pay | Admitting: Internal Medicine

## 2019-05-08 DIAGNOSIS — J438 Other emphysema: Secondary | ICD-10-CM

## 2019-05-08 DIAGNOSIS — G459 Transient cerebral ischemic attack, unspecified: Secondary | ICD-10-CM

## 2019-05-10 DIAGNOSIS — J439 Emphysema, unspecified: Secondary | ICD-10-CM | POA: Diagnosis not present

## 2019-05-10 DIAGNOSIS — S42212A Unspecified displaced fracture of surgical neck of left humerus, initial encounter for closed fracture: Secondary | ICD-10-CM | POA: Diagnosis not present

## 2019-05-10 DIAGNOSIS — Z8673 Personal history of transient ischemic attack (TIA), and cerebral infarction without residual deficits: Secondary | ICD-10-CM | POA: Diagnosis not present

## 2019-05-10 DIAGNOSIS — S42292A Other displaced fracture of upper end of left humerus, initial encounter for closed fracture: Secondary | ICD-10-CM | POA: Diagnosis not present

## 2019-05-10 DIAGNOSIS — I10 Essential (primary) hypertension: Secondary | ICD-10-CM | POA: Diagnosis not present

## 2019-05-10 DIAGNOSIS — M25512 Pain in left shoulder: Secondary | ICD-10-CM | POA: Diagnosis not present

## 2019-05-10 DIAGNOSIS — Z79899 Other long term (current) drug therapy: Secondary | ICD-10-CM | POA: Diagnosis not present

## 2019-05-10 DIAGNOSIS — S42252A Displaced fracture of greater tuberosity of left humerus, initial encounter for closed fracture: Secondary | ICD-10-CM | POA: Diagnosis not present

## 2019-05-10 DIAGNOSIS — S42352A Displaced comminuted fracture of shaft of humerus, left arm, initial encounter for closed fracture: Secondary | ICD-10-CM | POA: Diagnosis not present

## 2019-05-10 DIAGNOSIS — W19XXXA Unspecified fall, initial encounter: Secondary | ICD-10-CM | POA: Diagnosis not present

## 2019-05-13 DIAGNOSIS — S42292A Other displaced fracture of upper end of left humerus, initial encounter for closed fracture: Secondary | ICD-10-CM | POA: Diagnosis not present

## 2019-05-13 DIAGNOSIS — U071 COVID-19: Secondary | ICD-10-CM | POA: Diagnosis not present

## 2019-05-13 DIAGNOSIS — Z20822 Contact with and (suspected) exposure to covid-19: Secondary | ICD-10-CM | POA: Diagnosis not present

## 2019-05-13 DIAGNOSIS — Z79899 Other long term (current) drug therapy: Secondary | ICD-10-CM | POA: Diagnosis not present

## 2019-05-13 DIAGNOSIS — Z01812 Encounter for preprocedural laboratory examination: Secondary | ICD-10-CM | POA: Diagnosis not present

## 2019-05-13 DIAGNOSIS — Z7982 Long term (current) use of aspirin: Secondary | ICD-10-CM | POA: Diagnosis not present

## 2019-05-14 DIAGNOSIS — M25519 Pain in unspecified shoulder: Secondary | ICD-10-CM | POA: Diagnosis not present

## 2019-05-14 DIAGNOSIS — S42292A Other displaced fracture of upper end of left humerus, initial encounter for closed fracture: Secondary | ICD-10-CM | POA: Diagnosis not present

## 2019-05-15 ENCOUNTER — Encounter: Payer: Self-pay | Admitting: Internal Medicine

## 2019-05-15 DIAGNOSIS — J439 Emphysema, unspecified: Secondary | ICD-10-CM | POA: Diagnosis not present

## 2019-05-15 DIAGNOSIS — Z8673 Personal history of transient ischemic attack (TIA), and cerebral infarction without residual deficits: Secondary | ICD-10-CM | POA: Diagnosis not present

## 2019-05-15 DIAGNOSIS — S42292A Other displaced fracture of upper end of left humerus, initial encounter for closed fracture: Secondary | ICD-10-CM | POA: Diagnosis not present

## 2019-05-15 DIAGNOSIS — Z885 Allergy status to narcotic agent status: Secondary | ICD-10-CM | POA: Diagnosis not present

## 2019-05-15 DIAGNOSIS — R918 Other nonspecific abnormal finding of lung field: Secondary | ICD-10-CM | POA: Insufficient documentation

## 2019-05-15 DIAGNOSIS — I10 Essential (primary) hypertension: Secondary | ICD-10-CM | POA: Diagnosis not present

## 2019-05-16 ENCOUNTER — Telehealth: Payer: Self-pay

## 2019-05-16 ENCOUNTER — Other Ambulatory Visit: Payer: Self-pay | Admitting: Internal Medicine

## 2019-05-16 DIAGNOSIS — Z8673 Personal history of transient ischemic attack (TIA), and cerebral infarction without residual deficits: Secondary | ICD-10-CM | POA: Diagnosis not present

## 2019-05-16 DIAGNOSIS — S42292A Other displaced fracture of upper end of left humerus, initial encounter for closed fracture: Secondary | ICD-10-CM | POA: Diagnosis not present

## 2019-05-16 DIAGNOSIS — R911 Solitary pulmonary nodule: Secondary | ICD-10-CM

## 2019-05-16 DIAGNOSIS — J439 Emphysema, unspecified: Secondary | ICD-10-CM | POA: Diagnosis not present

## 2019-05-16 DIAGNOSIS — Z885 Allergy status to narcotic agent status: Secondary | ICD-10-CM | POA: Diagnosis not present

## 2019-05-16 DIAGNOSIS — I10 Essential (primary) hypertension: Secondary | ICD-10-CM | POA: Diagnosis not present

## 2019-05-16 NOTE — Telephone Encounter (Signed)
CT of chest ordered.

## 2019-05-16 NOTE — Telephone Encounter (Signed)
Patient daughter called saying patient needs to have follow up imaging ordered from a mass that was found accidentally on a CT scan at [place] the order placed as Urgent and they can call her - Seth Bake- to schedule the appt for pt since she will be helping caring for her.

## 2019-05-28 DIAGNOSIS — S72112D Displaced fracture of greater trochanter of left femur, subsequent encounter for closed fracture with routine healing: Secondary | ICD-10-CM | POA: Diagnosis not present

## 2019-05-28 DIAGNOSIS — S42212D Unspecified displaced fracture of surgical neck of left humerus, subsequent encounter for fracture with routine healing: Secondary | ICD-10-CM | POA: Diagnosis not present

## 2019-05-28 DIAGNOSIS — S42202D Unspecified fracture of upper end of left humerus, subsequent encounter for fracture with routine healing: Secondary | ICD-10-CM | POA: Diagnosis not present

## 2019-05-28 DIAGNOSIS — Z9889 Other specified postprocedural states: Secondary | ICD-10-CM | POA: Diagnosis not present

## 2019-05-29 ENCOUNTER — Ambulatory Visit: Payer: Medicaid Other

## 2019-06-08 ENCOUNTER — Other Ambulatory Visit: Payer: Self-pay | Admitting: Internal Medicine

## 2019-06-08 DIAGNOSIS — J438 Other emphysema: Secondary | ICD-10-CM

## 2019-06-12 ENCOUNTER — Ambulatory Visit
Admission: RE | Admit: 2019-06-12 | Discharge: 2019-06-12 | Disposition: A | Discharge status: Home / Self Care | Payer: Medicaid Other | Source: Ambulatory Visit | Attending: Internal Medicine | Admitting: Internal Medicine

## 2019-06-12 ENCOUNTER — Other Ambulatory Visit: Payer: Self-pay

## 2019-06-12 DIAGNOSIS — R911 Solitary pulmonary nodule: Secondary | ICD-10-CM | POA: Insufficient documentation

## 2019-06-13 ENCOUNTER — Other Ambulatory Visit: Payer: Self-pay | Admitting: Internal Medicine

## 2019-06-13 NOTE — Progress Notes (Unsigned)
per

## 2019-06-14 DIAGNOSIS — S42202D Unspecified fracture of upper end of left humerus, subsequent encounter for fracture with routine healing: Secondary | ICD-10-CM | POA: Diagnosis not present

## 2019-06-18 ENCOUNTER — Telehealth: Payer: Self-pay

## 2019-06-18 NOTE — Telephone Encounter (Signed)
I sent a message to Kendra Mullins asking if they can go ahead and see her so she doesn't have to leave the [facility] system.

## 2019-06-18 NOTE — Telephone Encounter (Signed)
Pt daughter Seth Bake called saying she discussed with the pt about following up with lung nodules in 6 months with Nurse Case Managers and they do not want to wait this long. Pt wants to be referred to [facility] in [place]. She said insurance requires a referral.  Please Advise.  CM

## 2019-06-19 ENCOUNTER — Other Ambulatory Visit: Payer: Self-pay | Admitting: Internal Medicine

## 2019-06-19 DIAGNOSIS — R911 Solitary pulmonary nodule: Secondary | ICD-10-CM

## 2019-06-19 NOTE — Telephone Encounter (Signed)
Spoke with daughter.  They really want to go to [facility] but will wait to see when her appt will be with [facility].  I have put in a referral to [facility] as requested as well.

## 2019-06-20 ENCOUNTER — Other Ambulatory Visit: Payer: Self-pay | Admitting: Oncology

## 2019-06-20 NOTE — Progress Notes (Signed)
Error

## 2019-06-21 ENCOUNTER — Telehealth: Payer: Self-pay | Admitting: Oncology

## 2019-06-21 NOTE — Telephone Encounter (Signed)
RE: Lung Nodule  Consulted Dr. Patsey Berthold who reviewed recent CT chest.  She recommends repeat follow-up in approximately 3 months.  Per Dr. Patsey Berthold, if lung nodule is less than 8 mm it will not show up on PET scan.  We will attempt to call patient to let her know our recommendations.  Faythe Casa, NP 06/21/2019 3:39 PM

## 2019-06-21 NOTE — Telephone Encounter (Signed)
Re: Lung Nodule-CT scan Results  Attempted to contact patient to review results of recent CT chest and also offer recommendations.  It appears that she has already been referred to pulmonology by her PCP.  I discussed case with Dr. Grayland Ormond, medical oncologist who agreed with pulmonology referral.  We believe nodule to be too small to be seen on PET scan at this time.  She may need consultation with thoracic surgery at some point but I think pulmonary should offer recommendations first.  Faythe Casa, NP 06/21/2019 3:16 PM

## 2019-07-02 DIAGNOSIS — S42292D Other displaced fracture of upper end of left humerus, subsequent encounter for fracture with routine healing: Secondary | ICD-10-CM | POA: Diagnosis not present

## 2019-07-02 DIAGNOSIS — S42202D Unspecified fracture of upper end of left humerus, subsequent encounter for fracture with routine healing: Secondary | ICD-10-CM | POA: Diagnosis not present

## 2019-07-02 DIAGNOSIS — S42352D Displaced comminuted fracture of shaft of humerus, left arm, subsequent encounter for fracture with routine healing: Secondary | ICD-10-CM | POA: Diagnosis not present

## 2019-07-03 ENCOUNTER — Other Ambulatory Visit: Payer: Medicaid Other

## 2019-07-08 ENCOUNTER — Other Ambulatory Visit: Payer: Self-pay | Admitting: Internal Medicine

## 2019-07-08 DIAGNOSIS — F410 Panic disorder [episodic paroxysmal anxiety] without agoraphobia: Secondary | ICD-10-CM | POA: Diagnosis not present

## 2019-07-08 DIAGNOSIS — F411 Generalized anxiety disorder: Secondary | ICD-10-CM | POA: Diagnosis not present

## 2019-07-08 DIAGNOSIS — I10 Essential (primary) hypertension: Secondary | ICD-10-CM

## 2019-07-08 DIAGNOSIS — F331 Major depressive disorder, recurrent, moderate: Secondary | ICD-10-CM | POA: Diagnosis not present

## 2019-07-15 DIAGNOSIS — M19041 Primary osteoarthritis, right hand: Secondary | ICD-10-CM | POA: Diagnosis not present

## 2019-07-15 DIAGNOSIS — J449 Chronic obstructive pulmonary disease, unspecified: Secondary | ICD-10-CM | POA: Diagnosis not present

## 2019-07-15 DIAGNOSIS — L539 Erythematous condition, unspecified: Secondary | ICD-10-CM | POA: Diagnosis not present

## 2019-07-15 DIAGNOSIS — S61451A Open bite of right hand, initial encounter: Secondary | ICD-10-CM | POA: Diagnosis not present

## 2019-07-15 DIAGNOSIS — M7989 Other specified soft tissue disorders: Secondary | ICD-10-CM | POA: Diagnosis not present

## 2019-07-15 DIAGNOSIS — S60511A Abrasion of right hand, initial encounter: Secondary | ICD-10-CM | POA: Diagnosis not present

## 2019-07-15 DIAGNOSIS — Z23 Encounter for immunization: Secondary | ICD-10-CM | POA: Diagnosis not present

## 2019-07-15 DIAGNOSIS — M79641 Pain in right hand: Secondary | ICD-10-CM | POA: Diagnosis not present

## 2019-07-15 DIAGNOSIS — Z8673 Personal history of transient ischemic attack (TIA), and cerebral infarction without residual deficits: Secondary | ICD-10-CM | POA: Diagnosis not present

## 2019-07-15 DIAGNOSIS — S6991XA Unspecified injury of right wrist, hand and finger(s), initial encounter: Secondary | ICD-10-CM | POA: Diagnosis not present

## 2019-07-15 DIAGNOSIS — I10 Essential (primary) hypertension: Secondary | ICD-10-CM | POA: Diagnosis not present

## 2019-07-16 ENCOUNTER — Other Ambulatory Visit: Payer: Self-pay

## 2019-07-16 ENCOUNTER — Ambulatory Visit: Payer: Medicaid Other | Admitting: Internal Medicine

## 2019-07-17 ENCOUNTER — Ambulatory Visit: Payer: Medicaid Other | Admitting: Internal Medicine

## 2019-07-23 DIAGNOSIS — R911 Solitary pulmonary nodule: Secondary | ICD-10-CM | POA: Diagnosis not present

## 2019-07-23 DIAGNOSIS — R0600 Dyspnea, unspecified: Secondary | ICD-10-CM | POA: Diagnosis not present

## 2019-08-01 DIAGNOSIS — Z681 Body mass index (BMI) 19 or less, adult: Secondary | ICD-10-CM | POA: Diagnosis not present

## 2019-08-01 DIAGNOSIS — Z713 Dietary counseling and surveillance: Secondary | ICD-10-CM | POA: Diagnosis not present

## 2019-08-07 ENCOUNTER — Other Ambulatory Visit: Payer: Self-pay | Admitting: Internal Medicine

## 2019-08-07 DIAGNOSIS — I10 Essential (primary) hypertension: Secondary | ICD-10-CM

## 2019-08-07 DIAGNOSIS — G459 Transient cerebral ischemic attack, unspecified: Secondary | ICD-10-CM

## 2019-08-07 NOTE — Telephone Encounter (Signed)
Call to patient- left message to call office for appointment. Courtesy RF given.

## 2019-08-19 DIAGNOSIS — R911 Solitary pulmonary nodule: Secondary | ICD-10-CM | POA: Diagnosis not present

## 2019-08-19 DIAGNOSIS — R0600 Dyspnea, unspecified: Secondary | ICD-10-CM | POA: Diagnosis not present

## 2019-08-21 DIAGNOSIS — R911 Solitary pulmonary nodule: Secondary | ICD-10-CM | POA: Diagnosis not present

## 2019-09-06 ENCOUNTER — Other Ambulatory Visit: Payer: Self-pay | Admitting: Internal Medicine

## 2019-09-06 DIAGNOSIS — J438 Other emphysema: Secondary | ICD-10-CM

## 2019-09-06 DIAGNOSIS — I10 Essential (primary) hypertension: Secondary | ICD-10-CM

## 2019-09-06 NOTE — Telephone Encounter (Signed)
Requested medication (s) are due for refill today: Yes  Requested medication (s) are on the active medication list: Yes  Last refill:  08/07/19 courtesy refill  Future visit scheduled: No  Notes to clinic:  Unable to refill per protocol, courtesy refills given, appointment needed.     Requested Prescriptions  Pending Prescriptions Disp Refills   benazepril (LOTENSIN) 5 MG tablet [Pharmacy Med Name: BENAZEPRIL 5MG  TABLETS] 30 tablet 0    Sig: TAKE 1 TABLET BY MOUTH DAILY      Cardiovascular:  ACE Inhibitors Failed - 09/06/2019  5:53 PM      Failed - Cr in normal range and within 180 days    Creatinine, Ser  Date Value Ref Range Status  12/06/2017 0.62 0.57 - 1.00 mg/dL Final          Failed - K in normal range and within 180 days    Potassium  Date Value Ref Range Status  12/06/2017 4.7 3.5 - 5.2 mmol/L Final          Passed - Patient is not pregnant      Passed - Last BP in normal range    BP Readings from Last 1 Encounters:  04/09/18 132/68          Passed - Valid encounter within last 6 months    Recent Outpatient Visits           4 months ago COVID-19 virus infection   [facility] Glean Hess, [facility]   1 year ago Infected sebaceous cyst   [facility] Laura H, MD   1 year ago Essential (primary) hypertension   [facility] Glean Hess, MD   2 years ago TIA (transient ischemic attack)   [facility] Glean Hess, MD   2 years ago Acute exacerbation of chronic obstructive airways disease [facility])   [facility] Glean Hess, MD                PROAIR HFA 108 [phone] Base) MCG/ACT inhaler [Pharmacy Med Name: PROAIR HFA ORAL INH (200  PFS) 8.5G] 8.5 g 12    Sig: INHALE 2 PUFFS BY MOUTH INTO THE LUNGS EVERY 6 HOURS AS NEEDED FOR WHEEZING OR SHORTNESS OF BREATH      Pulmonology:  Beta Agonists Failed - 09/06/2019  5:53 PM      Failed - One inhaler should last at least one month. If the  patient is requesting refills earlier, contact the patient to check for uncontrolled symptoms.      Passed - Valid encounter within last 12 months    Recent Outpatient Visits           4 months ago COVID-19 virus infection   [facility] Glean Hess, MD   1 year ago Infected sebaceous cyst   [facility] Glean Hess, MD   1 year ago Essential (primary) hypertension   [facility] Glean Hess, MD   2 years ago TIA (transient ischemic attack)   [facility] Glean Hess, MD   2 years ago Acute exacerbation of chronic obstructive airways disease [facility])   [facility] Glean Hess, MD                CARDIZEM LA 240 MG 24 hr tablet [Pharmacy Med Name: CARDIZEM LA 240MG  TABLETS] 30 tablet 0    Sig: TAKE 1 TABLET BY MOUTH EVERY DAY  Cardiovascular:  Calcium Channel Blockers Passed - 09/06/2019  5:53 PM      Passed - Last BP in normal range    BP Readings from Last 1 Encounters:  04/09/18 132/68          Passed - Valid encounter within last 6 months    Recent Outpatient Visits           4 months ago COVID-19 virus infection   [facility] Glean Hess, MD   1 year ago Infected sebaceous cyst   [facility] Glean Hess, MD   1 year ago Essential (primary) hypertension   [facility] Glean Hess, MD   2 years ago TIA (transient ischemic attack)   [facility] Glean Hess, MD   2 years ago Acute exacerbation of chronic obstructive airways disease [facility])   [facility] Glean Hess, MD

## 2019-09-10 ENCOUNTER — Other Ambulatory Visit: Payer: Self-pay | Admitting: Internal Medicine

## 2019-09-10 DIAGNOSIS — I10 Essential (primary) hypertension: Secondary | ICD-10-CM

## 2019-10-07 ENCOUNTER — Other Ambulatory Visit: Payer: Self-pay | Admitting: Internal Medicine

## 2019-10-07 ENCOUNTER — Ambulatory Visit: Payer: Medicaid Other | Admitting: Internal Medicine

## 2019-10-07 DIAGNOSIS — G459 Transient cerebral ischemic attack, unspecified: Secondary | ICD-10-CM

## 2019-10-07 DIAGNOSIS — J438 Other emphysema: Secondary | ICD-10-CM

## 2019-10-07 NOTE — Telephone Encounter (Signed)
Requested Prescriptions  Pending Prescriptions Disp Refills   PROAIR HFA 108 (90 Base) MCG/ACT inhaler [Pharmacy Med Name: PROAIR HFA ORAL INH (200  PFS) 8.5G] 8.5 g 0    Sig: INHALE 2 PUFFS BY MOUTH INTO THE LUNGS EVERY 6 HOURS AS NEEDED FOR WHEEZING OR SHORTNESS OF BREATH     Pulmonology:  Beta Agonists Failed - 10/07/2019 10:28 AM      Failed - One inhaler should last at least one month. If the patient is requesting refills earlier, contact the patient to check for uncontrolled symptoms.      Passed - Valid encounter within last 12 months    Recent Outpatient Visits          5 months ago COVID-19 virus infection   [facility] Glean Hess, MD   1 year ago Infected sebaceous cyst   [facility] Glean Hess, MD   1 year ago Essential (primary) hypertension   [facility] Glean Hess, MD   2 years ago TIA (transient ischemic attack)   [facility] Glean Hess, MD   2 years ago Acute exacerbation of chronic obstructive airways disease [facility])   [facility] Glean Hess, MD              atorvastatin (LIPITOR) 40 MG tablet [Pharmacy Med Name: ATORVASTATIN 40MG  TABLETS] 30 tablet 0    Sig: TAKE 1 TABLET BY MOUTH EVERY DAY     Cardiovascular:  Antilipid - Statins Failed - 10/07/2019 10:28 AM      Failed - Total Cholesterol in normal range and within 360 days    Cholesterol, Total  Date Value Ref Range Status  12/06/2017 174 100 - 199 mg/dL Final         Failed - LDL in normal range and within 360 days    LDL Calculated  Date Value Ref Range Status  12/06/2017 39 0 - 99 mg/dL Final         Failed - HDL in normal range and within 360 days    HDL  Date Value Ref Range Status  12/06/2017 125 >39 mg/dL Final         Failed - Triglycerides in normal range and within 360 days    Triglycerides  Date Value Ref Range Status  12/06/2017 51 0 - 149 mg/dL Final         Passed - Patient is not pregnant       Passed - Valid encounter within last 12 months    Recent Outpatient Visits          5 months ago COVID-19 virus infection   [facility] Glean Hess, MD   1 year ago Infected sebaceous cyst   [facility] Glean Hess, MD   1 year ago Essential (primary) hypertension   [facility] Glean Hess, MD   2 years ago TIA (transient ischemic attack)   [facility] Glean Hess, MD   2 years ago Acute exacerbation of chronic obstructive airways disease [facility])   [facility] Glean Hess, MD

## 2019-10-10 ENCOUNTER — Telehealth: Payer: Self-pay | Admitting: Internal Medicine

## 2019-10-10 DIAGNOSIS — Z419 Encounter for procedure for purposes other than remedying health state, unspecified: Secondary | ICD-10-CM | POA: Diagnosis not present

## 2019-10-10 NOTE — Telephone Encounter (Signed)
Pt is calling back and her father is in hospice and she needs refill on cardizem 240 mg. [facility] [address]. Pt said med was denied and that she will make an appt the patient would like callback

## 2019-10-10 NOTE — Telephone Encounter (Signed)
Copied from [place] [phone]. Topic: General - Other >> Oct 10, 2019 10:22 AM Hinda Lenis D wrote: Reason for CRM: PT need a call back / personal issues / please advise

## 2019-10-11 ENCOUNTER — Other Ambulatory Visit: Payer: Self-pay

## 2019-10-11 DIAGNOSIS — I10 Essential (primary) hypertension: Secondary | ICD-10-CM

## 2019-10-11 MED ORDER — CARDIZEM LA 240 MG PO TB24: 240.0000 mg | ORAL_TABLET | Freq: Every day | ORAL | 0 refills | Status: DC

## 2019-10-25 ENCOUNTER — Telehealth: Payer: Self-pay

## 2019-10-25 ENCOUNTER — Ambulatory Visit (INDEPENDENT_AMBULATORY_CARE_PROVIDER_SITE_OTHER): Payer: Medicaid Other | Admitting: Internal Medicine

## 2019-10-25 ENCOUNTER — Encounter: Payer: Self-pay | Admitting: Internal Medicine

## 2019-10-25 ENCOUNTER — Other Ambulatory Visit: Payer: Self-pay

## 2019-10-25 VITALS — BP 110/54 | HR 96 | Temp 98.6°F | Ht 66.0 in | Wt 82.0 lb

## 2019-10-25 DIAGNOSIS — Z9189 Other specified personal risk factors, not elsewhere classified: Secondary | ICD-10-CM | POA: Diagnosis not present

## 2019-10-25 DIAGNOSIS — J438 Other emphysema: Secondary | ICD-10-CM | POA: Diagnosis not present

## 2019-10-25 DIAGNOSIS — F1020 Alcohol dependence, uncomplicated: Secondary | ICD-10-CM | POA: Diagnosis not present

## 2019-10-25 DIAGNOSIS — I10 Essential (primary) hypertension: Secondary | ICD-10-CM | POA: Diagnosis not present

## 2019-10-25 DIAGNOSIS — R911 Solitary pulmonary nodule: Secondary | ICD-10-CM

## 2019-10-25 DIAGNOSIS — I700 Atherosclerosis of aorta: Secondary | ICD-10-CM | POA: Diagnosis not present

## 2019-10-25 DIAGNOSIS — F17200 Nicotine dependence, unspecified, uncomplicated: Secondary | ICD-10-CM

## 2019-10-25 MED ORDER — ATORVASTATIN CALCIUM 40 MG PO TABS: 40.0000 mg | ORAL_TABLET | Freq: Every day | ORAL | 5 refills | Status: DC

## 2019-10-25 MED ORDER — CARDIZEM LA 240 MG PO TB24: 240.0000 mg | ORAL_TABLET | Freq: Every day | ORAL | 5 refills | Status: DC

## 2019-10-25 MED ORDER — NICOTINE 21 MG/24HR TD PT24: 21.0000 mg | MEDICATED_PATCH | Freq: Every day | TRANSDERMAL | 1 refills | Status: DC

## 2019-10-25 MED ORDER — PROAIR HFA 108 (90 BASE) MCG/ACT IN AERS: 2.0000 | INHALATION_SPRAY | Freq: Four times a day (QID) | RESPIRATORY_TRACT | 5 refills | Status: DC | PRN

## 2019-10-25 MED ORDER — BENAZEPRIL HCL 5 MG PO TABS: 5.0000 mg | ORAL_TABLET | Freq: Every day | ORAL | 5 refills | Status: DC

## 2019-10-25 NOTE — Progress Notes (Signed)
Date:  10/25/2019   Name:  Kendra Mullins   DOB:  December 13, 1960   MRN:  [id number]   Chief Complaint: Follow-up (medication)  Hypertension This is a chronic problem. The problem is controlled. Associated symptoms include shortness of breath. Pertinent negatives include no chest pain, headaches or palpitations. Past treatments include calcium channel blockers and ACE inhibitors. The current treatment provides significant improvement. There are no compliance problems.  aortic athersclerosis.  Hyperlipidemia This is a chronic problem. Associated symptoms include shortness of breath. Pertinent negatives include no chest pain. Current antihyperlipidemic treatment includes statins.  Nicotine Dependence Presents for follow-up visit. Symptoms are negative for fatigue. Her urge triggers include company of smokers. The symptoms have been improving. She smokes < 1/2 a pack of cigarettes per day. Compliance with prior treatments: would like nicotine patches refilled.  Pulmonary nodule suspicious for malignancy - she has seen Oncology.  She completed PFTs and PET scan which was positive.  She had to delay follow up because her father died recently. She plans to see Oncology again in August after the family vacation.  Lab Results  Component Value Date   CREATININE 0.62 12/06/2017   BUN 7 12/06/2017   NA 136 12/06/2017   K 4.7 12/06/2017   CL 96 12/06/2017   CO2 23 12/06/2017   Lab Results  Component Value Date   CHOL 174 12/06/2017   HDL 125 12/06/2017   LDLCALC 39 12/06/2017   TRIG 51 12/06/2017   CHOLHDL 1.4 12/06/2017   Lab Results  Component Value Date   TSH 0.806 12/06/2017   No results found for: HGBA1C Lab Results  Component Value Date   WBC 7.8 04/09/2018   HGB 13.7 04/09/2018   HCT 38.1 04/09/2018   MCV 92 04/09/2018   PLT 386 04/09/2018   Lab Results  Component Value Date   ALT 35 (H) 12/06/2017   AST 30 12/06/2017   ALKPHOS 113 12/06/2017   BILITOT 0.7 12/06/2017      Review of Systems  Constitutional: Positive for unexpected weight change. Negative for chills, fatigue and fever.  Respiratory: Positive for chest tightness and shortness of breath. Negative for wheezing.   Cardiovascular: Negative for chest pain, palpitations and leg swelling.  Gastrointestinal: Negative for abdominal pain.  Skin: Negative for wound.  Neurological: Negative for dizziness and headaches.  Hematological: Bruises/bleeds easily.  Psychiatric/Behavioral: Positive for dysphoric mood. Negative for sleep disturbance. The patient is nervous/anxious.     Patient Active Problem List   Diagnosis Date Noted  . Mass of upper lobe of left lung 05/15/2019  . Closed fracture of head of left humerus 05/15/2019  . Aortic atherosclerosis ([place]) 12/31/2017  . TIA (transient ischemic attack) 06/25/2017  . Localized primary osteoarthritis of hands, bilateral 05/02/2017  . Continuous chronic alcoholism ([place]) 09/15/2014  . Anxiety 09/15/2014  . Cough, persistent 09/15/2014  . Smokes tobacco daily 09/15/2014  . Essential hypertension 09/15/2014  . Hot flash, menopausal 09/15/2014  . MI (mitral incompetence) 09/15/2014  . Centrilobular emphysema ([place]) 09/15/2014  . Low weight 09/15/2014    Allergies  Allergen Reactions  . Azithromycin Nausea Only  . Codeine Nausea And Vomiting    Past Surgical History:  Procedure Laterality Date  . ABDOMINAL HYSTERECTOMY  1986    Social History   Tobacco Use  . Smoking status: Current Every Day Smoker    Packs/day: 0.50    Types: Cigarettes  . Smokeless tobacco: Never Used  . Tobacco comment: using generic nicodern cq  patches to try to quit  Vaping Use  . Vaping Use: Every day  Substance Use Topics  . Alcohol use: Yes    Alcohol/week: 50.0 standard drinks    Types: 50 Standard drinks or equivalent per week  . Drug use: No     Medication list has been reviewed and updated.  Current Meds  Medication Sig  . acetaminophen  (TYLENOL) 500 MG tablet Take by mouth as needed.   . ALPRAZolam (XANAX) 0.5 MG tablet Take 0.5 mg by mouth 2 (two) times daily as needed for anxiety.  . ASPIRIN LOW DOSE 81 MG EC tablet TAKE 1 TABLET BY MOUTH ONCE DAILY  . atorvastatin (LIPITOR) 40 MG tablet TAKE 1 TABLET BY MOUTH EVERY DAY  . ATROVENT HFA 17 MCG/ACT inhaler INHALE 2 PUFFS BY MOUTH EVERY 6 HOURS AS NEEDED FOR WHEEZING  . benazepril (LOTENSIN) 5 MG tablet TAKE 1 TABLET BY MOUTH DAILY  . CARDIZEM LA 240 MG 24 hr tablet Take 1 tablet (240 mg total) by mouth daily.  . fluticasone (FLONASE) 50 MCG/ACT nasal spray USE 2 SPRAYS IN EACH NOSTRIL DAILY  . nicotine (NICODERM CQ - DOSED IN MG/24 HOURS) 14 mg/24hr patch APPLY 1 PATCH TO THE SKIN DAILY  . PROAIR HFA 108 (90 Base) MCG/ACT inhaler INHALE 2 PUFFS BY MOUTH INTO THE LUNGS EVERY 6 HOURS AS NEEDED FOR WHEEZING OR SHORTNESS OF BREATH  . PROVENTIL HFA 108 (90 Base) MCG/ACT inhaler INHALE 2 PUFFS INTO THE LUNGS EVERY 6 HOURS AS NEEDED FOR WHEEZING OR SHORTNESS OF BREATH  . sertraline (ZOLOFT) 25 MG tablet Take 25 mg by mouth daily.    PHQ 2/9 Scores 10/25/2019 04/16/2019 12/06/2017 11/21/2016  PHQ - 2 Score 4 2 6 1   PHQ- 9 Score 4 2 15  -    GAD 7 : Generalized Anxiety Score 10/25/2019  Nervous, Anxious, on Edge 2  Control/stop worrying 2  Worry too much - different things 2  Trouble relaxing 0  Restless 0  Easily annoyed or irritable 0  Afraid - awful might happen 0  Total GAD 7 Score 6  Anxiety Difficulty Not difficult at all    BP Readings from Last 3 Encounters:  10/25/19 (!) 110/54  04/09/18 132/68  12/06/17 130/68    Physical Exam Constitutional:      Appearance: She is underweight.  Cardiovascular:     Rate and Rhythm: Normal rate and regular rhythm.     Pulses: Normal pulses.     Heart sounds: No murmur heard.   Pulmonary:     Effort: Pulmonary effort is normal. No respiratory distress.     Breath sounds: No wheezing or rhonchi.  Musculoskeletal:         General: Normal range of motion.     Cervical back: Normal range of motion.     Right lower leg: No edema.     Left lower leg: No edema.  Lymphadenopathy:     Cervical: No cervical adenopathy.  Skin:    General: Skin is warm.     Findings: Bruising present.  Neurological:     General: No focal deficit present.     Mental Status: She is alert and oriented to person, place, and time.  Psychiatric:        Mood and Affect: Mood normal.        Behavior: Behavior normal.     Wt Readings from Last 3 Encounters:  10/25/19 82 lb (37.2 kg)  04/16/19 85 lb (38.6 kg)  04/09/18  85 lb (38.6 kg)    BP (!) 110/54   Pulse 96   Temp 98.6 F (37 C) (Oral)   Ht 5\' 6"  (1.676 m)   Wt 82 lb (37.2 kg)   SpO2 97%   BMI 13.24 kg/m   Assessment and Plan: 1. Essential hypertension Clinically stable exam with well controlled BP. Tolerating medications without side effects at this time. Pt to continue current regimen and low sodium diet; benefits of regular exercise as able discussed. - benazepril (LOTENSIN) 5 MG tablet; Take 1 tablet (5 mg total) by mouth daily.  Dispense: 30 tablet; Refill: 5 - CARDIZEM LA 240 MG 24 hr tablet; Take 1 tablet (240 mg total) by mouth daily.  Dispense: 30 tablet; Refill: 5 - CBC with Differential/Platelet - Comprehensive metabolic panel - TSH + free T4  2. Aortic atherosclerosis (HCC) Now on high intensity statin therapy No side effects or other concerns - atorvastatin (LIPITOR) 40 MG tablet; Take 1 tablet (40 mg total) by mouth daily.  Dispense: 30 tablet; Refill: 5 - Lipid panel  3. Alcohol use disorder, severe, dependence ([place]) She is trying to cut back - difficult to do with recent stresses Her diet is not good - has tried supplements Case of chocolate Ensure samples given  4. Pulmonary nodule less than 1 cm in diameter with moderate to high risk for malignant neoplasm Follow up with oncology as planned  5. Smokes tobacco daily - nicotine (NICODERM CQ  - DOSED IN MG/24 HOURS) 21 mg/24hr patch; Place 1 patch (21 mg total) onto the skin daily.  Dispense: 30 patch; Refill: 1  6. Other emphysema (HCC) - PROAIR HFA 108 (90 Base) MCG/ACT inhaler; Inhale 2 puffs into the lungs every 6 (six) hours as needed for wheezing or shortness of breath.  Dispense: 8.5 g; Refill: 5   Partially dictated using Editor, commissioning. Any errors are unintentional.  Halina Maidens, MD [facility]  10/25/2019

## 2019-10-25 NOTE — Telephone Encounter (Signed)
Called pt and left a VM asking pt if she can move her appt time to 3pm instead of 340 this afternoon since we had a cancellation.  Awaiting call back to confirm and change appt time. CRM created for PEC.  CM

## 2019-10-26 ENCOUNTER — Other Ambulatory Visit: Payer: Self-pay | Admitting: Internal Medicine

## 2019-10-26 DIAGNOSIS — J438 Other emphysema: Secondary | ICD-10-CM

## 2019-10-26 LAB — COMPREHENSIVE METABOLIC PANEL
ALT: 22 IU/L (ref 0–32)
AST: 25 IU/L (ref 0–40)
Albumin/Globulin Ratio: 2 (ref 1.2–2.2)
Albumin: 5 g/dL — ABNORMAL HIGH (ref 3.8–4.9)
Alkaline Phosphatase: 116 IU/L (ref 48–121)
BUN/Creatinine Ratio: 16 (ref 9–23)
BUN: 9 mg/dL (ref 6–24)
Bilirubin Total: 0.5 mg/dL (ref 0.0–1.2)
CO2: 23 mmol/L (ref 20–29)
Calcium: 9.7 mg/dL (ref 8.7–10.2)
Chloride: 98 mmol/L (ref 96–106)
Creatinine, Ser: 0.56 mg/dL — ABNORMAL LOW (ref 0.57–1.00)
GFR calc Af Amer: 118 mL/min/{1.73_m2} (ref >59)
GFR calc non Af Amer: 102 mL/min/{1.73_m2} (ref >59)
Globulin, Total: 2.5 g/dL (ref 1.5–4.5)
Glucose: 85 mg/dL (ref 65–99)
Potassium: 4.4 mmol/L (ref 3.5–5.2)
Sodium: 137 mmol/L (ref 134–144)
Total Protein: 7.5 g/dL (ref 6.0–8.5)

## 2019-10-26 LAB — CBC WITH DIFFERENTIAL/PLATELET
Basophils Absolute: 0.1 10*3/uL (ref 0.0–0.2)
Basos: 1 %
EOS (ABSOLUTE): 0.1 10*3/uL (ref 0.0–0.4)
Eos: 1 %
Hematocrit: 40.2 % (ref 34.0–46.6)
Hemoglobin: 13.7 g/dL (ref 11.1–15.9)
Immature Grans (Abs): 0 10*3/uL (ref 0.0–0.1)
Immature Granulocytes: 0 %
Lymphocytes Absolute: 1.8 10*3/uL (ref 0.7–3.1)
Lymphs: 24 %
MCH: 32.6 pg (ref 26.6–33.0)
MCHC: 34.1 g/dL (ref 31.5–35.7)
MCV: 96 fL (ref 79–97)
Monocytes Absolute: 0.8 10*3/uL (ref 0.1–0.9)
Monocytes: 11 %
Neutrophils Absolute: 4.7 10*3/uL (ref 1.4–7.0)
Neutrophils: 63 %
Platelets: 319 10*3/uL (ref 150–450)
RBC: 4.2 x10E6/uL (ref 3.77–5.28)
RDW: 11.8 % (ref 11.7–15.4)
WBC: 7.5 10*3/uL (ref 3.4–10.8)

## 2019-10-26 LAB — TSH+FREE T4
Free T4: 1.15 ng/dL (ref 0.82–1.77)
TSH: 0.797 u[IU]/mL (ref 0.450–4.500)

## 2019-10-26 LAB — LIPID PANEL
Chol/HDL Ratio: 1.4 ratio (ref 0.0–4.4)
Cholesterol, Total: 187 mg/dL (ref 100–199)
HDL: 129 mg/dL (ref >39)
LDL Chol Calc (NIH): 49 mg/dL (ref 0–99)
Triglycerides: 40 mg/dL (ref 0–149)
VLDL Cholesterol Cal: 9 mg/dL (ref 5–40)

## 2019-10-28 ENCOUNTER — Telehealth: Payer: Self-pay | Admitting: Internal Medicine

## 2019-10-28 NOTE — Telephone Encounter (Unsigned)
Copied from [place] [id number]. Topic: General - Other >> Oct 28, 2019  3:01 PM Hinda Lenis D wrote: Reason for CRM: PT has questions about her labs / please advise

## 2019-10-28 NOTE — Telephone Encounter (Signed)
Called pt told her that her labs were normal. Pt verbalized understanding.  KP

## 2019-11-09 ENCOUNTER — Other Ambulatory Visit: Payer: Self-pay | Admitting: Internal Medicine

## 2019-11-09 DIAGNOSIS — I10 Essential (primary) hypertension: Secondary | ICD-10-CM

## 2019-11-10 DIAGNOSIS — Z419 Encounter for procedure for purposes other than remedying health state, unspecified: Secondary | ICD-10-CM | POA: Diagnosis not present

## 2019-11-11 ENCOUNTER — Telehealth: Payer: Self-pay | Admitting: Internal Medicine

## 2019-11-11 ENCOUNTER — Other Ambulatory Visit: Payer: Self-pay

## 2019-11-11 DIAGNOSIS — I10 Essential (primary) hypertension: Secondary | ICD-10-CM

## 2019-11-11 DIAGNOSIS — J438 Other emphysema: Secondary | ICD-10-CM

## 2019-11-11 MED ORDER — ALBUTEROL SULFATE HFA 108 (90 BASE) MCG/ACT IN AERS: 2.0000 | INHALATION_SPRAY | Freq: Four times a day (QID) | RESPIRATORY_TRACT | 1 refills | Status: DC | PRN

## 2019-11-11 NOTE — Telephone Encounter (Signed)
Medication Refill - Medication: CARDIZEM LA 240 MG 24 hr tablet AND "Proventil Inhaler"  Best contact: [phone] (Pt is requesting a call back from clinic she is completely out)   Has the patient contacted their pharmacy? Yes.   (Agent: If no, request that the patient contact the pharmacy for the refill.) (Agent: If yes, when and what did the pharmacy advise?)  Preferred Pharmacy (with phone number or street name):  [facility] [place], [place] - [address] AT [address]  [address] [place] [address]  Phone: [phone] Fax: [phone]     Agent: Please be advised that RX refills may take up to 3 business days. We ask that you follow-up with your pharmacy.

## 2019-11-11 NOTE — Telephone Encounter (Signed)
Patient is requesting RF of Proventil inhaler. Call to patient to clarify need- this is not listed as current on her list- she states the Proair is supposed to be the same- but it is not- she states she has been using the Proventil all along and that is what she wants- Patient will need new Rx for this- request sent to PCP for review. (Other request sent through request portal)

## 2019-11-11 NOTE — Telephone Encounter (Signed)
Sent in [place] in pharmacy notes for the patient.  CM

## 2019-11-12 ENCOUNTER — Telehealth: Payer: Self-pay

## 2019-11-12 NOTE — Telephone Encounter (Signed)
Pharmacy advised patient Prior Auth is needed for albuterol (PROVENTIL HFA) 108 (90 Base) MCG/ACT inhaler patient would like a follow up call today best [phone]

## 2019-11-12 NOTE — Telephone Encounter (Signed)
Called and spoke with patient. Informed her insurance is no longer going to cover PROVENTIL brand. Told her she needs to continue albuterol generic, and atrovent inhaler.   She verbalized understanding.  CM

## 2019-12-10 ENCOUNTER — Other Ambulatory Visit: Payer: Self-pay | Admitting: Internal Medicine

## 2019-12-10 DIAGNOSIS — J438 Other emphysema: Secondary | ICD-10-CM

## 2019-12-11 DIAGNOSIS — Z419 Encounter for procedure for purposes other than remedying health state, unspecified: Secondary | ICD-10-CM | POA: Diagnosis not present

## 2020-01-06 DIAGNOSIS — C3412 Malignant neoplasm of upper lobe, left bronchus or lung: Secondary | ICD-10-CM | POA: Diagnosis not present

## 2020-01-06 DIAGNOSIS — R911 Solitary pulmonary nodule: Secondary | ICD-10-CM | POA: Diagnosis not present

## 2020-01-06 DIAGNOSIS — Z7289 Other problems related to lifestyle: Secondary | ICD-10-CM | POA: Diagnosis not present

## 2020-01-06 DIAGNOSIS — F17210 Nicotine dependence, cigarettes, uncomplicated: Secondary | ICD-10-CM | POA: Diagnosis not present

## 2020-01-06 DIAGNOSIS — Z23 Encounter for immunization: Secondary | ICD-10-CM | POA: Diagnosis not present

## 2020-01-15 ENCOUNTER — Other Ambulatory Visit: Payer: Self-pay | Admitting: Internal Medicine

## 2020-01-15 DIAGNOSIS — J438 Other emphysema: Secondary | ICD-10-CM

## 2020-01-16 NOTE — Telephone Encounter (Signed)
Requested Prescriptions  Pending Prescriptions Disp Refills   PROVENTIL HFA 108 (90 Base) MCG/ACT inhaler [Pharmacy Med Name: PROVENTIL HFA IN W/DOS CTR 200PUFFS] 6.7 g 3    Sig: INHALE 2 PUFFS INTO THE LUNGS EVERY 6 HOURS AS NEEDED FOR WHEEZING OR SHORTNESS OF BREATH     Pulmonology:  Beta Agonists Failed - 01/15/2020  7:07 PM      Failed - One inhaler should last at least one month. If the patient is requesting refills earlier, contact the patient to check for uncontrolled symptoms.      Passed - Valid encounter within last 12 months    Recent Outpatient Visits          2 months ago Essential hypertension   [facility] Glean Hess, MD   9 months ago COVID-19 virus infection   [facility] Glean Hess, MD   1 year ago Infected sebaceous cyst   [facility] Glean Hess, MD   2 years ago Essential (primary) hypertension   [facility] Glean Hess, MD   2 years ago TIA (transient ischemic attack)   [facility] Glean Hess, MD

## 2020-01-23 DIAGNOSIS — F411 Generalized anxiety disorder: Secondary | ICD-10-CM | POA: Diagnosis not present

## 2020-01-23 DIAGNOSIS — F331 Major depressive disorder, recurrent, moderate: Secondary | ICD-10-CM | POA: Diagnosis not present

## 2020-01-29 ENCOUNTER — Other Ambulatory Visit: Payer: Self-pay | Admitting: Internal Medicine

## 2020-01-29 DIAGNOSIS — J438 Other emphysema: Secondary | ICD-10-CM

## 2020-01-29 DIAGNOSIS — C3492 Malignant neoplasm of unspecified part of left bronchus or lung: Secondary | ICD-10-CM | POA: Insufficient documentation

## 2020-01-29 MED ORDER — ALBUTEROL SULFATE HFA 108 (90 BASE) MCG/ACT IN AERS: 2.0000 | INHALATION_SPRAY | Freq: Four times a day (QID) | RESPIRATORY_TRACT | 5 refills | Status: DC | PRN

## 2020-02-04 ENCOUNTER — Telehealth: Payer: Self-pay

## 2020-02-04 NOTE — Telephone Encounter (Signed)
Called pt left VM to call back. Pt had a RF of proair inhaler 01/29/2020. Her insurance does not cover Proventil.  KP

## 2020-02-04 NOTE — Telephone Encounter (Unsigned)
Copied from [place] [phone] [phone]. Topic: General - Inquiry >> Feb 04, 2020  2:27 PM Greggory Keen D wrote: Reason for CRM: Pt called pharmacy for her refill of the Proventil and they told her  It was denied. Pt said this Rx works better than the other inhalers that she has used.  [facility] [place]  CB#  [phone]

## 2020-02-05 NOTE — Telephone Encounter (Signed)
Phone call to pt.  Left vm to return call to office to discuss her request for a different inhaler.

## 2020-02-06 NOTE — Telephone Encounter (Signed)
Pt. Called back.States the Proventil works better for her than the [employer]. Asking if Dr. Army Melia can contact her insurance and do anything about this. Or if there is a program that would pay for the inhaler.Please advise.

## 2020-02-06 NOTE — Telephone Encounter (Signed)
Attempted to call patient about her medication request- left message on voicemail to call office.

## 2020-02-07 NOTE — Telephone Encounter (Signed)
There are no other options for an albuterol inhaler.  She might get the nebulizer solution for less if she has a nebulizer machine.  Not sure that I have seen her recently - may need an OV.

## 2020-02-12 ENCOUNTER — Ambulatory Visit: Payer: Medicaid Other | Admitting: Internal Medicine

## 2020-02-19 ENCOUNTER — Ambulatory Visit: Payer: Medicaid Other | Admitting: Internal Medicine

## 2020-03-10 ENCOUNTER — Other Ambulatory Visit: Payer: Self-pay

## 2020-03-10 MED ORDER — DILTIAZEM HCL ER BEADS 240 MG PO CP24: 240.0000 mg | ORAL_CAPSULE | Freq: Every day | ORAL | 1 refills | Status: DC

## 2020-03-11 DIAGNOSIS — Z419 Encounter for procedure for purposes other than remedying health state, unspecified: Secondary | ICD-10-CM | POA: Diagnosis not present

## 2020-03-13 ENCOUNTER — Other Ambulatory Visit: Payer: Self-pay | Admitting: Internal Medicine

## 2020-03-13 ENCOUNTER — Ambulatory Visit: Payer: Self-pay | Admitting: *Deleted

## 2020-03-13 ENCOUNTER — Telehealth: Payer: Self-pay | Admitting: Internal Medicine

## 2020-03-13 ENCOUNTER — Other Ambulatory Visit: Payer: Self-pay

## 2020-03-13 DIAGNOSIS — J438 Other emphysema: Secondary | ICD-10-CM

## 2020-03-13 DIAGNOSIS — I10 Essential (primary) hypertension: Secondary | ICD-10-CM

## 2020-03-13 MED ORDER — ALBUTEROL SULFATE HFA 108 (90 BASE) MCG/ACT IN AERS: 2.0000 | INHALATION_SPRAY | Freq: Four times a day (QID) | RESPIRATORY_TRACT | 2 refills | Status: DC | PRN

## 2020-03-13 MED ORDER — CARDIZEM LA 240 MG PO TB24: 240.0000 mg | ORAL_TABLET | Freq: Every day | ORAL | 5 refills | Status: DC

## 2020-03-13 NOTE — Telephone Encounter (Signed)
Her insurance denied the Cardiazem LA which is why we sent in the other.

## 2020-03-13 NOTE — Telephone Encounter (Signed)
Medication Refill - Medication: Cardizem LA 240 mg tabs (Patient stated that she wants this prescription called in today and not the generic diltiazem (TIAZAC) 240 MG 24 hr capsule . Patient stated that the cardizem is what works for her and she is completely out of this medication. Patient would like a callback from nurse once prescription has been sent to pharmacy.)   Has the patient contacted their pharmacy? yes (Agent: If no, request that the patient contact the pharmacy for the refill.) (Agent: If yes, when and what did the pharmacy advise?)Contact PCP  Preferred Pharmacy (with phone number or street name):  [id number] - [place], [place] [address] Phone:  [phone]  Fax:  [phone]       Agent: Please be advised that RX refills may take up to 3 business days. We ask that you follow-up with your pharmacy.

## 2020-03-13 NOTE — Telephone Encounter (Signed)
Dr. Army Melia sent in meds but pt will have to pay out of pocket.  KP

## 2020-03-13 NOTE — Telephone Encounter (Signed)
Please Advise.  KP

## 2020-03-13 NOTE — Telephone Encounter (Signed)
Patient called to request PCP write Rx for Cardizem LA 240 mg instead of diltiazem (Tiazac) 240mg  due to effects of headache. Patient reports insurance has denied previous Rx of Cardizem LA and Proventil inhaler which she has been able to get in the past for years. Patient is requesting PCP to reorder Cardizem LA due to headaches when taking diltiazem in the past . Reviewed with patient she will need to contact her insurance company for additional information concerning medications no longer covered by insurance. Please advise if reordering cardizem LA and proventil inhaler appropriate. Patient requesting call back, please advise if appt needed..   Reason for Disposition . Prescription request for new medicine (not a refill)  Answer Assessment - Initial Assessment Questions 1. DRUG NAME: "What medicine do you need to have refilled?"     cardizem LA 240mg  2. REFILLS REMAINING: "How many refills are remaining?" (Note: The label on the medicine or pill bottle will show how many refills are remaining. If there are no refills remaining, then a renewal may be needed.)     None. Insurance denied Rx and approved diltiazem (Tiazac) 240mg  which patient reports is not as effective as Cardizem LA. 3. EXPIRATION DATE: "What is the expiration date?" (Note: The label states when the prescription will expire, and thus can no longer be refilled.)     na 4. PRESCRIBING HCP: "Who prescribed it?" Reason: If prescribed by specialist, call should be referred to that group.     Dr. Army Melia for hypertension 5. SYMPTOMS: "Do you have any symptoms?"     headache 6. PREGNANCY: "Is there any chance that you are pregnant?" "When was your last menstrual period?"     na  Protocols used: MEDICATION REFILL AND RENEWAL CALL-A-AH

## 2020-03-13 NOTE — Telephone Encounter (Signed)
Patient given message below, she verbalized understanding. She says she will call the Medicaid office on Monday to see what can be done.

## 2020-03-13 NOTE — Telephone Encounter (Signed)
Noted. Pt called back and spoke to PCP. Will review other message.  KP

## 2020-03-13 NOTE — Telephone Encounter (Signed)
Called pt left VM to call back. Pts insurance denied Cardizem LA that's why Dr. B sent in diltiazem.   Will route result note to [facility] Nurse Triage for follow up when patient returns call to clinic. Nurse may give results to patient if they return call. CRM created for this message.   KP

## 2020-03-13 NOTE — Telephone Encounter (Signed)
Called pt left VM. Stated that if we send in the medications that her insurance company has already denied that she would need to pay out of pocket for the medications. Told pt to call back to let us know that she understands and agrees that to get Cardizem and Proventil that she would need to pay out of pocket. Need to know If she agrees before we send meds in.  Will route result note to [facility] Nurse Triage for follow up when patient returns call to clinic. Nurse may give results to patient if they return call. CRM created for this message.   KP    KP

## 2020-03-16 ENCOUNTER — Telehealth: Payer: Self-pay | Admitting: Internal Medicine

## 2020-03-16 NOTE — Telephone Encounter (Signed)
Called pt told her to call insurance company to see what medication they will pay for. Told pt to get Cardizem and Proventil she would have to pay out of pocket. PA has already been denied. Pt verbalized understanding and will call us back with alternate medication.  KP

## 2020-03-16 NOTE — Telephone Encounter (Signed)
Kendra Mullins calling from [employer] is calling to state that the patient does not want to pay out of pocket for CARDIZEM LA 240 MG 24 hr tablet [[id number]] and is requesting a PA. Also is requesting a PA for  Proventil . Please advise CB- [phone] or [email or web address]

## 2020-03-21 ENCOUNTER — Other Ambulatory Visit: Payer: Self-pay | Admitting: Internal Medicine

## 2020-03-21 DIAGNOSIS — J438 Other emphysema: Secondary | ICD-10-CM

## 2020-04-09 ENCOUNTER — Other Ambulatory Visit: Payer: Self-pay | Admitting: Internal Medicine

## 2020-04-11 DIAGNOSIS — Z419 Encounter for procedure for purposes other than remedying health state, unspecified: Secondary | ICD-10-CM | POA: Diagnosis not present

## 2020-05-07 ENCOUNTER — Other Ambulatory Visit: Payer: Self-pay

## 2020-05-07 ENCOUNTER — Ambulatory Visit (INDEPENDENT_AMBULATORY_CARE_PROVIDER_SITE_OTHER): Payer: Medicaid Other

## 2020-05-07 ENCOUNTER — Other Ambulatory Visit: Payer: Self-pay | Admitting: Internal Medicine

## 2020-05-07 ENCOUNTER — Encounter: Payer: Self-pay | Admitting: Emergency Medicine

## 2020-05-07 ENCOUNTER — Telehealth: Payer: Self-pay

## 2020-05-07 ENCOUNTER — Ambulatory Visit
Admission: EM | Admit: 2020-05-07 | Discharge: 2020-05-07 | Disposition: A | Discharge status: Home / Self Care | Payer: Medicaid Other | Attending: Sports Medicine | Admitting: Sports Medicine

## 2020-05-07 DIAGNOSIS — M25562 Pain in left knee: Secondary | ICD-10-CM | POA: Diagnosis not present

## 2020-05-07 DIAGNOSIS — I700 Atherosclerosis of aorta: Secondary | ICD-10-CM

## 2020-05-07 DIAGNOSIS — S8002XA Contusion of left knee, initial encounter: Secondary | ICD-10-CM

## 2020-05-07 DIAGNOSIS — S8992XA Unspecified injury of left lower leg, initial encounter: Secondary | ICD-10-CM | POA: Diagnosis not present

## 2020-05-07 DIAGNOSIS — M1712 Unilateral primary osteoarthritis, left knee: Secondary | ICD-10-CM | POA: Diagnosis not present

## 2020-05-07 DIAGNOSIS — I10 Essential (primary) hypertension: Secondary | ICD-10-CM

## 2020-05-07 MED ORDER — KETOROLAC TROMETHAMINE 60 MG/2ML IM SOLN
30.0000 mg | Freq: Once | INTRAMUSCULAR | Status: AC
  Administered 2020-05-07: 30 mg via INTRAMUSCULAR

## 2020-05-07 NOTE — ED Triage Notes (Signed)
Pt c/o left knee pain. She states last night her dog ran into her leg and caused her to fall. No swelling or briusing.

## 2020-05-07 NOTE — ED Provider Notes (Addendum)
[facility]    CSN: [id number] Arrival date & time: 05/07/20  1313      History   Chief Complaint Chief Complaint  Patient presents with  . Fall  . Knee Pain    left    HPI Kendra Mullins is a 60 y.o. female.   Patient is a 60 year old female who presents for evaluation of the above issues.  She reports injuring her left knee last night.  Date of injury 05/06/2020.  She says that her 100 pound dog came running and and hit the lateral aspect of her left knee.  She said it knocked her down and her husband needed to assist her.  She had difficulty sleeping last night and difficulty ambulating.  Her husband brought her to the urgent care for evaluation.  She reports that she is not have osteopenia or osteoporosis.  Review of her chart indicates that she did have a fracture to her proximal humerus that was managed nonoperatively.  This fracture was back in January 2021.  She denies any previous injuries to this knee.  Review of her chart indicates that she has alcohol use disorder and she is anorexic with a significantly low BMI.  No red flag signs or symptoms elicited on history.     Past Medical History:  Diagnosis Date  . Ankle inflammation 09/15/2014  . COPD (chronic obstructive pulmonary disease) ([place])   . Hypertension   . PTSD (post-traumatic stress disorder)     Patient Active Problem List   Diagnosis Date Noted  . NSCLC of left lung ([place]) 01/29/2020  . Mass of upper lobe of left lung 05/15/2019  . Closed fracture of head of left humerus 05/15/2019  . Aortic atherosclerosis ([place]) 12/31/2017  . TIA (transient ischemic attack) 06/25/2017  . Localized primary osteoarthritis of hands, bilateral 05/02/2017  . Continuous chronic alcoholism ([place]) 09/15/2014  . Anxiety 09/15/2014  . Cough, persistent 09/15/2014  . Tobacco use disorder 09/15/2014  . Essential hypertension 09/15/2014  . Hot flash, menopausal 09/15/2014  . MI (mitral incompetence) 09/15/2014  .  Centrilobular emphysema ([place]) 09/15/2014  . Low weight 09/15/2014    Past Surgical History:  Procedure Laterality Date  . ABDOMINAL HYSTERECTOMY  1986    OB History    Gravida  3   Para      Term      Preterm      AB  1   Living  2     SAB  1   IAB      Ectopic      Multiple      Live Births               Home Medications    Prior to Admission medications   Medication Sig Start Date End Date Taking? Authorizing Provider  acetaminophen (TYLENOL) 500 MG tablet Take by mouth as needed.  05/28/19 05/27/20 Yes [provider]  ALPRAZolam Duanne Moron) 0.5 MG tablet Take 0.5 mg by mouth 2 (two) times daily as needed for anxiety.   Yes [provider]  ASPIRIN LOW DOSE 81 MG EC tablet TAKE 1 TABLET BY MOUTH ONCE DAILY 10/12/17  Yes Glean Hess, MD  atorvastatin (LIPITOR) 40 MG tablet TAKE 1 TABLET(40 MG) BY MOUTH DAILY 05/07/20  Yes Glean Hess, MD  ATROVENT HFA 17 MCG/ACT inhaler INHALE 2 PUFFS BY MOUTH EVERY 6 HOURS AS NEEDED FOR WHEEZING 06/09/19  Yes Glean Hess, MD  benazepril (LOTENSIN) 5 MG tablet TAKE  1 TABLET(5 MG) BY MOUTH DAILY 05/07/20  Yes Glean Hess, MD  CARDIZEM LA 240 MG 24 hr tablet Take 1 tablet (240 mg total) by mouth daily. 03/13/20  Yes Glean Hess, MD  sertraline (ZOLOFT) 25 MG tablet Take 25 mg by mouth daily.   Yes [provider]  albuterol (VENTOLIN HFA) 108 (90 Base) MCG/ACT inhaler Inhale 2 puffs into the lungs every 6 (six) hours as needed for wheezing or shortness of breath. 03/13/20   Glean Hess, MD  fluticasone [facility]) 50 MCG/ACT nasal spray SHAKE LIQUID AND USE 2 SPRAYS IN [facility] NOSTRIL DAILY 04/09/20   Glean Hess, MD  nicotine (NICODERM CQ - DOSED IN MG/24 HOURS) 21 mg/24hr patch Place 1 patch (21 mg total) onto the skin daily. 10/25/19   Glean Hess, MD    Family History Family History  Problem Relation Age of Onset  . Cancer Mother        breast    Social  History Social History   Tobacco Use  . Smoking status: Current Every Day Smoker    Packs/day: 0.50    Types: Cigarettes  . Smokeless tobacco: Never Used  . Tobacco comment: using generic nicodern cq patches to try to quit  Vaping Use  . Vaping Use: Every day  Substance Use Topics  . Alcohol use: Yes    Alcohol/week: 50.0 standard drinks    Types: 50 Standard drinks or equivalent per week  . Drug use: No     Allergies   Azithromycin and Codeine   Review of Systems Review of Systems  Musculoskeletal: Positive for arthralgias. Negative for joint swelling.  Skin: Negative for color change, pallor, rash and wound.  Neurological: Negative for dizziness, syncope, light-headedness, numbness and headaches.  Hematological: Does not bruise/bleed easily.  All other systems reviewed and are negative.    Physical Exam Triage Vital Signs ED Triage Vitals  Enc Vitals Group     BP 05/07/20 1333 (!) 145/72     Pulse Rate 05/07/20 1333 71     Resp 05/07/20 1333 18     Temp 05/07/20 1333 98.2 F (36.8 C)     Temp Source 05/07/20 1333 Oral     SpO2 05/07/20 1333 98 %     Weight --      Height --      Head Circumference --      Peak Flow --      Pain Score 05/07/20 1330 9     Pain Loc --      Pain Edu? --      Excl. in [place]? --    No data found.  Updated Vital Signs BP (!) 145/72 (BP Location: Left Arm)   Pulse 71   Temp 98.2 F (36.8 C) (Oral)   Resp 18   SpO2 98%   Visual Acuity Right Eye Distance:   Left Eye Distance:   Bilateral Distance:    Right Eye Near:   Left Eye Near:    Bilateral Near:     Physical Exam Vitals and nursing note reviewed.  Constitutional:      General: She is not in acute distress.    Appearance: She is not ill-appearing or toxic-appearing.     Comments: Patient is very thin and cachectic looking.  HENT:     Head: Normocephalic and atraumatic.  Musculoskeletal:     Comments: Right knee: Normal to inspection palpation range of motion  special test.  Left knee: No  obvious bony abnormality, ecchymosis, erythema or soft tissue swelling.  There is no joint effusion.  She is diffusely tender over the lateral aspect of her knee.  No significant joint line tenderness.  Some limited exam secondary to patient compliance.  She is complaining of pain with terminal extension as well as flexion past 90 degrees.  That said, her knee is stable to varus and valgus stress testing.  Anterior drawer and Lachman are negative.  Homans and Malabar tests are negative.  Neurovascular: Normal sensation 2+ pulses distally.  Skin:    Capillary Refill: Capillary refill takes less than 2 seconds.  Neurological:     General: No focal deficit present.     Mental Status: She is alert and oriented to person, place, and time.      UC Treatments / Results  Labs (all labs ordered are listed, but only abnormal results are displayed) Labs Reviewed - No data to display  EKG   Radiology DG Knee Complete 4 Views Left  Result Date: 05/07/2020 CLINICAL DATA:  left knee pain - trauma EXAM: LEFT KNEE - COMPLETE 4+ VIEW COMPARISON:  None. FINDINGS: Physiologic alignment. Mild medial compartment joint space loss and subchondral sclerosis. No fracture or focal osseous lesion. Soft tissues are within normal limits. IMPRESSION: No acute osseous abnormality. Mild medial compartment degenerative changes. Electronically Signed   By: Primitivo Gauze M.D.   On: 05/07/2020 14:18    Procedures Procedures (including critical care time)  Medications Ordered in UC Medications  ketorolac (TORADOL) injection 30 mg (30 mg Intramuscular Given 05/07/20 1456)    Initial Impression / Assessment and Plan / UC Course  I have reviewed the triage vital signs and the nursing notes.  Pertinent labs & imaging results that were available during my care of the patient were reviewed by me and considered in my medical decision making (see chart for details).  Clinical  impression: Injury to the lateral aspect of her left knee.  Exam is somewhat limited secondary to patient apprehension.  It seems consistent with a contusion.  Doubt internal derangement or fracture.  Treatment plan: 1.  The findings and treatment plan were discussed in detail with the patient.  Patient was in agreement. 2.  Given the patient's discomfort I will go ahead and just rule out a fracture.  We will get an x-ray.  X-ray results above show some mild medial compartment degenerative changes but no acute findings. 3.  Patient will be given educational handout on a knee contusion and I will advise supportive care including icing elevation and to advance her ambulation status. 4.  Over-the-counter meds as needed for discomfort.  Tylenol or Motrin.  No narcotics are necessary for this. 5.  Just supportive care for now and will see her back as needed.    Final Clinical Impressions(s) / UC Diagnoses   Final diagnoses:  Acute pain of left knee  Contusion of left knee, initial encounter     Discharge Instructions     Given the patient's discomfort I will go ahead and just rule out a fracture.  We will get an x-ray.  X-ray results above show some mild medial compartment degenerative changes but no acute findings. Patient will be given educational handout on a knee contusion and I will advise supportive care including icing elevation and to advance her ambulation status. Over-the-counter meds as needed for discomfort.  Tylenol or Motrin.  No narcotics are necessary for this. Just supportive care for now and will see her back  as needed.    ED Prescriptions    None     PDMP not reviewed this encounter.   Addendum:  I wanted to discharge the patient and she was adamant that she needed something more than over-the-counter Tylenol or Motrin for her discomfort.  I indicated I would not be giving narcotics for a knee injury that was consistent with a contusion without internal derangement  on exam and no fracture on x-ray.  I offered her a Toradol injection.  She accepted.  30 mg of Toradol IM was administered and the patient was discharged in stable condition.   Verda Cumins, MD 05/07/20 1444    Verda Cumins, MD 05/07/20 [phone]

## 2020-05-07 NOTE — Telephone Encounter (Signed)
Patient wants Dr Army Melia to prescribe the CARDIZEM LA 240 MG 24 hr tablet [[id number]] but patient said she can not afford the medication prescribed.

## 2020-05-07 NOTE — Telephone Encounter (Signed)
Called pt left VM to call back. Dr. Army Melia is out of the office she will return Monday, 1/31. Will forward this message to her. Will give pt a call next week.  Will route result note to [facility] Nurse Triage for follow up when patient returns call to clinic. Nurse may give results to patient if they return call. CRM created for this message.   KP

## 2020-05-07 NOTE — Telephone Encounter (Signed)
Patient said she was going to urgent care with another matter from an injury from her dog, but will call back to set up appointment.

## 2020-05-07 NOTE — Discharge Instructions (Addendum)
Given the patient's discomfort I will go ahead and just rule out a fracture.  We will get an x-ray.  X-ray results above show some mild medial compartment degenerative changes but no acute findings. Patient will be given educational handout on a knee contusion and I will advise supportive care including icing elevation and to advance her ambulation status. Over-the-counter meds as needed for discomfort.  Tylenol or Motrin.  No narcotics are necessary for this. Just supportive care for now and will see her back as needed.

## 2020-05-08 NOTE — Telephone Encounter (Signed)
Call to patient- made aware that she will get call back next week with options on her medication- she states she will set up appointment for follow up with office when she hears back- thank you for letting her know.

## 2020-05-08 NOTE — Telephone Encounter (Signed)
Attempted to call pt.  Left vm to return call to office.

## 2020-05-08 NOTE — Telephone Encounter (Signed)
Patient called back was not able to get a nurse on the line office still closed for lunch. Please call Ph# [phone]

## 2020-05-11 ENCOUNTER — Other Ambulatory Visit: Payer: Self-pay | Admitting: Internal Medicine

## 2020-05-11 DIAGNOSIS — J438 Other emphysema: Secondary | ICD-10-CM

## 2020-05-12 DIAGNOSIS — Z419 Encounter for procedure for purposes other than remedying health state, unspecified: Secondary | ICD-10-CM | POA: Diagnosis not present

## 2020-05-12 NOTE — Telephone Encounter (Signed)
Requested medications are due for refill today.  yes  Requested medications are on the active medications list.  yes  Last refill. 06/09/2019  Future visit scheduled.  no  Notes to clinic.  Pt was to return 04/15/2020 for folllow up.

## 2020-05-12 NOTE — Telephone Encounter (Signed)
Please call pt to schedule a BP follow up.  KP

## 2020-05-15 ENCOUNTER — Telehealth: Payer: Self-pay | Admitting: Internal Medicine

## 2020-05-15 NOTE — Telephone Encounter (Signed)
Cherrita calling from [facility] is calling to follow up on a complaint received regarding patients medication. Patient reports that diltiazem is not working for her and is requesting to go back to cardizem.  Agent report to [facility] the the script for cardizem was written on 12/21. The script for diltizem was written in 2016 from a different provider. CB- [phone]  Complaint Num [id number]

## 2020-05-18 NOTE — Telephone Encounter (Signed)
Called and left VM for Cherrita letting her know that we have went over this with the patient for several months now. We even did a Prior Auth to try and get Cardizem approved and it was DENIED. Her insurance will not cover this medication. She will need to continue to take Diltiazem or she can choose to pay out of pocket for Cardizem medication.  Told her to call our office back if she has any further questions.  Clista Bernhardt, CMA (AAMA)

## 2020-05-19 DIAGNOSIS — Z90710 Acquired absence of both cervix and uterus: Secondary | ICD-10-CM | POA: Insufficient documentation

## 2020-05-22 ENCOUNTER — Telehealth: Payer: Self-pay | Admitting: Internal Medicine

## 2020-05-22 NOTE — Telephone Encounter (Signed)
Attempted to reach Kendra Mullins to schedule her for a phone visit with the [facility] on the Managed Medicaid Team. I left my contact info for her to call me back. I will reach out again in the next 7-14 days if I have not heard back from her.

## 2020-06-01 ENCOUNTER — Telehealth: Payer: Self-pay | Admitting: Internal Medicine

## 2020-06-01 NOTE — Telephone Encounter (Signed)
2nd attempt to reach Ms.Ward to schedule her for a phone appt with the Managed Medicaid Team. I left my name and number for her to call me back. I will reach out again in the next 7-14 days if Ihave not heard back from her.

## 2020-06-05 ENCOUNTER — Ambulatory Visit (INDEPENDENT_AMBULATORY_CARE_PROVIDER_SITE_OTHER): Payer: Medicaid Other | Admitting: Internal Medicine

## 2020-06-05 ENCOUNTER — Encounter: Payer: Self-pay | Admitting: Internal Medicine

## 2020-06-05 ENCOUNTER — Other Ambulatory Visit: Payer: Self-pay

## 2020-06-05 VITALS — BP 126/66 | HR 68 | Temp 97.9°F | Ht 66.0 in | Wt 82.0 lb

## 2020-06-05 DIAGNOSIS — F1020 Alcohol dependence, uncomplicated: Secondary | ICD-10-CM

## 2020-06-05 DIAGNOSIS — J438 Other emphysema: Secondary | ICD-10-CM

## 2020-06-05 DIAGNOSIS — C3492 Malignant neoplasm of unspecified part of left bronchus or lung: Secondary | ICD-10-CM

## 2020-06-05 DIAGNOSIS — I700 Atherosclerosis of aorta: Secondary | ICD-10-CM

## 2020-06-05 DIAGNOSIS — I10 Essential (primary) hypertension: Secondary | ICD-10-CM

## 2020-06-05 DIAGNOSIS — N3000 Acute cystitis without hematuria: Secondary | ICD-10-CM | POA: Diagnosis not present

## 2020-06-05 LAB — POC URINALYSIS WITH MICROSCOPIC (NON AUTO)MANUAL RESULT
Bilirubin, UA: NEGATIVE
Crystals: 0
Glucose, UA: NEGATIVE
Ketones, UA: NEGATIVE
Mucus, UA: 0
Nitrite, UA: NEGATIVE
Protein, UA: NEGATIVE
RBC: 0 M/uL — AB (ref 4.04–5.48)
Spec Grav, UA: 1.01 (ref 1.010–1.025)
Urobilinogen, UA: 0.2 E.U./dL
WBC Casts, UA: 30
pH, UA: 6.5 (ref 5.0–8.0)

## 2020-06-05 MED ORDER — ATORVASTATIN CALCIUM 40 MG PO TABS: 40.0000 mg | ORAL_TABLET | Freq: Every day | ORAL | 5 refills | Status: DC

## 2020-06-05 MED ORDER — BENAZEPRIL HCL 5 MG PO TABS: ORAL_TABLET | ORAL | 5 refills | Status: DC

## 2020-06-05 MED ORDER — SULFAMETHOXAZOLE-TRIMETHOPRIM 800-160 MG PO TABS: 1.0000 | ORAL_TABLET | Freq: Two times a day (BID) | ORAL | 0 refills | Status: AC

## 2020-06-05 NOTE — Progress Notes (Signed)
Date:  06/05/2020   Name:  Kendra Mullins   DOB:  [birth year]   MRN:  [id number]   Chief Complaint: Urinary Tract Infection (X2 days, Burning, freq. Urinating (small amount), lower back pain )  Urinary Tract Infection  This is a new problem. The current episode started yesterday. The problem occurs every urination. The quality of the pain is described as burning. The pain is mild. There has been no fever. Associated symptoms include frequency and urgency. Pertinent negatives include no chills or hematuria.  Hypertension This is a chronic problem. The problem is controlled. Associated symptoms include palpitations. Pertinent negatives include no chest pain, headaches or shortness of breath. Past treatments include calcium channel blockers and ACE inhibitors (has been on cardiazem tabs for years - recently changed to capsules and had side effects).  Hyperlipidemia This is a chronic problem. The problem is controlled. Pertinent negatives include no chest pain or shortness of breath. (Atherosclerosis on CT ) Current antihyperlipidemic treatment includes statins. The current treatment provides significant improvement of lipids. Risk factors for coronary artery disease include dyslipidemia.    Lab Results  Component Value Date   CREATININE 0.56 (L) 10/25/2019   BUN 9 10/25/2019   NA 137 10/25/2019   K 4.4 10/25/2019   CL 98 10/25/2019   CO2 23 10/25/2019   Lab Results  Component Value Date   CHOL 187 10/25/2019   HDL 129 10/25/2019   LDLCALC 49 10/25/2019   TRIG 40 10/25/2019   CHOLHDL 1.4 10/25/2019   Lab Results  Component Value Date   TSH 0.797 10/25/2019   No results found for: HGBA1C Lab Results  Component Value Date   WBC 7.5 10/25/2019   HGB 13.7 10/25/2019   HCT 40.2 10/25/2019   MCV 96 10/25/2019   PLT 319 10/25/2019   Lab Results  Component Value Date   ALT 22 10/25/2019   AST 25 10/25/2019   ALKPHOS 116 10/25/2019   BILITOT 0.5 10/25/2019     Review of  Systems  Constitutional: Negative for chills, fatigue and fever.  Respiratory: Negative for chest tightness and shortness of breath.   Cardiovascular: Positive for palpitations. Negative for chest pain.  Genitourinary: Positive for frequency and urgency. Negative for hematuria.  Neurological: Positive for light-headedness. Negative for dizziness and headaches.  Psychiatric/Behavioral: Negative for dysphoric mood and sleep disturbance. The patient is not nervous/anxious.     Patient Active Problem List   Diagnosis Date Noted  . Status post hysterectomy 05/19/2020  . NSCLC of left lung ([place]) 01/29/2020  . Mass of upper lobe of left lung 05/15/2019  . Closed fracture of head of left humerus 05/15/2019  . Aortic atherosclerosis ([place]) 12/31/2017  . TIA (transient ischemic attack) 06/25/2017  . Localized primary osteoarthritis of hands, bilateral 05/02/2017  . Continuous chronic alcoholism ([place]) 09/15/2014  . Anxiety 09/15/2014  . Cough, persistent 09/15/2014  . Tobacco use disorder 09/15/2014  . Essential hypertension 09/15/2014  . Hot flash, menopausal 09/15/2014  . MI (mitral incompetence) 09/15/2014  . Centrilobular emphysema ([place]) 09/15/2014  . Low weight 09/15/2014    Allergies  Allergen Reactions  . Azithromycin Nausea Only  . Codeine Nausea And Vomiting    Past Surgical History:  Procedure Laterality Date  . ABDOMINAL HYSTERECTOMY  1986    Social History   Tobacco Use  . Smoking status: Current Every Day Smoker    Packs/day: 0.50    Types: Cigarettes  . Smokeless tobacco: Never Used  . Tobacco comment:  using generic nicodern cq patches to try to quit  Vaping Use  . Vaping Use: Every day  Substance Use Topics  . Alcohol use: Yes    Alcohol/week: 50.0 standard drinks    Types: 50 Standard drinks or equivalent per week  . Drug use: No     Medication list has been reviewed and updated.  Current Meds  Medication Sig  . albuterol (VENTOLIN HFA) 108 (90 Base)  MCG/ACT inhaler Inhale 2 puffs into the lungs every 6 (six) hours as needed for wheezing or shortness of breath.  . ALPRAZolam (XANAX) 0.5 MG tablet Take 0.5 mg by mouth 2 (two) times daily as needed for anxiety.  . ASPIRIN LOW DOSE 81 MG EC tablet TAKE 1 TABLET BY MOUTH ONCE DAILY  . atorvastatin (LIPITOR) 40 MG tablet TAKE 1 TABLET(40 MG) BY MOUTH DAILY  . ATROVENT HFA 17 MCG/ACT inhaler INHALE 2 PUFFS BY MOUTH EVERY 6 HOURS AS NEEDED FOR WHEEZING  . benazepril (LOTENSIN) 5 MG tablet TAKE 1 TABLET(5 MG) BY MOUTH DAILY  . diltiazem (TIAZAC) 240 MG 24 hr capsule Take 240 mg by mouth daily.  . fluticasone (FLONASE) 50 MCG/ACT nasal spray SHAKE LIQUID AND USE 2 SPRAYS IN EACH NOSTRIL DAILY  . sertraline (ZOLOFT) 25 MG tablet Take 25 mg by mouth daily.    PHQ 2/9 Scores 06/05/2020 10/25/2019 04/16/2019 12/06/2017  PHQ - 2 Score 2 4 2 6   PHQ- 9 Score 3 4 2 15     GAD 7 : Generalized Anxiety Score 06/05/2020 10/25/2019  Nervous, Anxious, on Edge 1 2  Control/stop worrying 1 2  Worry too much - different things 1 2  Trouble relaxing 0 0  Restless 0 0  Easily annoyed or irritable 0 0  Afraid - awful might happen 0 0  Total GAD 7 Score 3 6  Anxiety Difficulty - Not difficult at all    BP Readings from Last 3 Encounters:  06/05/20 126/66  05/07/20 (!) 145/72  10/25/19 (!) 110/54    Physical Exam Vitals and nursing note reviewed.  Constitutional:      Appearance: She is well-developed and well-nourished.  Cardiovascular:     Rate and Rhythm: Normal rate and regular rhythm.     Heart sounds: Normal heart sounds.  Pulmonary:     Effort: Pulmonary effort is normal. No respiratory distress.     Breath sounds: Normal breath sounds.  Abdominal:     General: Bowel sounds are normal.     Palpations: Abdomen is soft.     Tenderness: There is abdominal tenderness in the suprapubic area. There is no CVA tenderness, guarding or rebound.  Musculoskeletal:     Cervical back: Normal range of motion.      Right lower leg: No edema.     Left lower leg: No edema.  Neurological:     General: No focal deficit present.     Mental Status: She is alert.  Psychiatric:        Mood and Affect: Mood and affect normal.     Wt Readings from Last 3 Encounters:  06/05/20 82 lb (37.2 kg)  10/25/19 82 lb (37.2 kg)  04/16/19 85 lb (38.6 kg)    BP 126/66   Pulse 68   Temp 97.9 F (36.6 C) (Oral)   Ht 5\' 6"  (1.676 m)   Wt 82 lb (37.2 kg)   SpO2 97%   BMI 13.24 kg/m   Assessment and Plan: 1. Acute cystitis without hematuria Increase fluids and  follow up if no improvement - POC urinalysis w microscopic (non auto) - sulfamethoxazole-trimethoprim (BACTRIM DS) 800-160 MG tablet; Take 1 tablet by mouth 2 (two) times daily for 7 days.  Dispense: 14 tablet; Refill: 0  2. Essential hypertension Clinically stable exam with well controlled BP on current medication but with side effects. Continue lotensin. Continue generic cardiazem - pt is appealing the change and will call if brand is approved Pt to continue current regimen and low sodium diet; benefits of regular exercise as able discussed.  3. Aortic atherosclerosis (HCC) Continue statin therapy; check labs next visit - atorvastatin (LIPITOR) 40 MG tablet; Take 1 tablet (40 mg total) by mouth daily.  Dispense: 30 tablet; Refill: 5  4. NSCLC of left lung [facility]) Now thought to be other process Plan is to re-scan now  5. Other emphysema ([place]) Still smoking but trying to cut back  6. Alcohol use disorder, severe, dependence ([place]) Continues to consume alcohol most days    Partially dictated using Editor, commissioning. Any errors are unintentional.  Halina Maidens, MD [facility]  06/05/2020

## 2020-06-08 ENCOUNTER — Other Ambulatory Visit: Payer: Self-pay

## 2020-06-08 ENCOUNTER — Other Ambulatory Visit: Payer: Self-pay | Admitting: Internal Medicine

## 2020-06-08 ENCOUNTER — Other Ambulatory Visit: Payer: Self-pay | Admitting: *Deleted

## 2020-06-08 DIAGNOSIS — I10 Essential (primary) hypertension: Secondary | ICD-10-CM

## 2020-06-08 MED ORDER — BLOOD PRESSURE MONITOR AUTOMAT DEVI
1.0000 | Freq: Every day | 0 refills | Status: DC
Start: 2020-06-08 — End: 2022-01-14

## 2020-06-08 NOTE — Patient Outreach (Signed)
Medicaid Managed Care   Nurse Care Manager Note  06/08/2020 Name:  Kendra Mullins MRN:  [id number] DOB:  1960-08-09  Kendra Mullins is an 60 y.o. year old female who is a primary patient of Army Melia, Jesse Sans, MD.  The [facility] Managed Care Coordination team was consulted for assistance with:    HTN  Kendra Mullins was given information about Medicaid Managed Care Coordination team services today. Kendra Mullins agreed to services and verbal consent obtained.  Engaged with patient by telephone for initial visit in response to provider referral for case management and/or care coordination services.   Assessments/Interventions:  Review of past medical history, allergies, medications, health status, including review of consultants reports, laboratory and other test data, was performed as part of comprehensive evaluation and provision of chronic care management services.  SDOH (Social Determinants of Health) assessments and interventions performed:   Care Plan  Allergies  Allergen Reactions  . Azithromycin Nausea Only  . Codeine Nausea And Vomiting    Medications Reviewed Today    Reviewed by Melissa Montane, RN (Registered Nurse) on 06/08/20 at 1451  Med List Status: <None>  Medication Order Taking? Sig Documenting Provider Last Dose Status Informant  albuterol (VENTOLIN HFA) 108 (90 Base) MCG/ACT inhaler [id number] Yes Inhale 2 puffs into the lungs every 6 (six) hours as needed for wheezing or shortness of breath. Glean Hess, MD Taking Active   ALPRAZolam Duanne Moron) 0.5 MG tablet [id number] Yes Take 0.5 mg by mouth 2 (two) times daily as needed for anxiety. [provider] Taking Active Pharmacy Records  ASPIRIN LOW DOSE 81 MG EC tablet [id number] Yes TAKE 1 TABLET BY MOUTH ONCE DAILY Glean Hess, MD Taking Active   atorvastatin (LIPITOR) 40 MG tablet [id number] Yes Take 1 tablet (40 mg total) by mouth daily. Glean Hess, MD Taking Active   ATROVENT HFA 17  MCG/ACT inhaler [id number] Yes INHALE 2 PUFFS BY MOUTH EVERY 6 HOURS AS NEEDED FOR WHEEZING Glean Hess, MD Taking Active   benazepril (LOTENSIN) 5 MG tablet [id number] Yes TAKE 1 TABLET(5 MG) BY MOUTH DAILY Glean Hess, MD Taking Active   diltiazem [facility]) 240 MG 24 hr capsule [id number] Yes Take 240 mg by mouth daily. [provider] Taking Active   fluticasone (FLONASE) 50 MCG/ACT nasal spray [id number] Yes SHAKE LIQUID AND USE 2 SPRAYS IN EACH NOSTRIL DAILY Glean Hess, MD Taking Active            Med Note Thamas Jaegers, Rozelia Catapano A   Mon Jun 08, 2020  2:49 PM) Taking as needed  nicotine (NICODERM CQ - DOSED IN MG/24 HOURS) 21 mg/24hr patch [id number] No Place 1 patch (21 mg total) onto the skin daily.  Patient not taking: No sig reported   Glean Hess, MD Not Taking Active   sertraline (ZOLOFT) 25 MG tablet [id number] Yes Take 25 mg by mouth daily. [provider] Taking Active   sulfamethoxazole-trimethoprim (BACTRIM DS) 800-160 MG tablet [id number] Yes Take 1 tablet by mouth 2 (two) times daily for 7 days. Glean Hess, MD Taking Active           Patient Active Problem List   Diagnosis Date Noted  . Status post hysterectomy 05/19/2020  . NSCLC of left lung ([place]) 01/29/2020  . Mass of upper lobe of left lung 05/15/2019  . Closed fracture of head of left humerus 05/15/2019  . Aortic atherosclerosis ([place]) 12/31/2017  . TIA (transient ischemic attack) 06/25/2017  .  Localized primary osteoarthritis of hands, bilateral 05/02/2017  . Continuous chronic alcoholism ([place]) 09/15/2014  . Anxiety 09/15/2014  . Cough, persistent 09/15/2014  . Tobacco use disorder 09/15/2014  . Essential hypertension 09/15/2014  . Hot flash, menopausal 09/15/2014  . MI (mitral incompetence) 09/15/2014  . Centrilobular emphysema ([place]) 09/15/2014  . Low weight 09/15/2014    Conditions to be addressed/monitored per PCP order:  HTN  Care Plan : Hypertension (Adult)  Updates  made by Melissa Montane, RN since 06/08/2020 12:00 AM    Problem: Hypertension (Hypertension)     Long-Range Goal: Hypertension Monitored   Start Date: 06/08/2020  Expected End Date: 08/08/2020  This Visit's Progress: On track  Priority: High  Note:   Current Barriers:  . Chronic Disease Management support and education needs related to HTN. Kendra Mullins has managed her HTN at home with medication since 2011. Recent changes to insurance has resulted in a change in her long term medication regimen from Cardizem to Diltiazem. The new medication makes her feel lightheaded and dizzy. She would like to switch back to taking Cardizem, but is unable to afford the out of pocket expense. She does not take her BP, because she does not have a BP monitor. She has contacted insurance and her provider regarding this issue and continues to have difficulty obtaining the medication that she feels best treats her BP.  Nurse Case Manager Clinical Goal(s):  Marland Kitchen Over the next 30 days, patient will verbalize understanding of plan for HTN . Over the next 30 days, patient will meet with RN Care Manager to address barriers to managing her health . Over the next 30 days, patient will attend all scheduled medical appointments . Over the next 30 days, patient will demonstrate improved health management independence as evidenced byobtaining a BP monitor, checking BP regularly, work on smoking cessation . Over the next 30 days, patient will work with CM team pharmacist to review medications  Interventions:  . Inter-disciplinary care team collaboration (see longitudinal plan of care) . Evaluation of current treatment plan related to HTN and patient's adherence to plan as established by provider. . Advised patient to call the [facility] [phone] for assistance . Provided education to patient re: HTN . Reviewed medications with patient and discussed taking medications as prescribed and the importance of checking  BP regularly . Discussed plans with patient for ongoing care management follow up and provided patient with direct contact information for care management team . Advised patient, providing education and rationale, to monitor blood pressure daily and record, calling PCP for findings outside established parameters.  . Pharmacy referral for medication management . Collaborate with PCP for BP monitor  Patient Goals/Self-Care Activities Over the next 30 days, patient will: - agree to work together to make changes - ask questions to understand - learn about high blood pressure  - continue to work on smoking cessation - check blood pressure weekly - check blood pressure if feeling weak, dizzy or lightheaded - call PCP with abnormal BP readings (low or high) - choose a place to take my blood pressure (home, clinic or office, retail store) - write blood pressure results in a log or diary  - call Medicaid Ombudsman [phone]) to discuss changes to covered medications  Follow Up Plan: Telephone follow up appointment with Managed Medicaid care management team member scheduled for:07/09/20 @ 3:30pm        Follow Up:  Patient agrees to Care Plan and Follow-up.  Plan: The Managed Medicaid  care management team will reach out to the patient again over the next 30 days.  Date/time of next scheduled RN care management/care coordination outreach:07/09/20 @ 3:30pm  Lurena Joiner RN, [place] RN Care Coordinator

## 2020-06-08 NOTE — Patient Instructions (Signed)
Visit Information  Ms. Kendra Mullins was given information about Medicaid Managed Care team care coordination services as a part of their [facility] Medicaid benefit. Kendra Mullins verbally consented to engagement with the [facility] Managed Care team.   For questions related to your [facility] health plan, please call: [phone]  If you would like to schedule transportation through your [facility] plan, please call the following number at least 2 days in advance of your appointment: [phone]   Ms. Kendra Mullins - following are the goals we discussed in your visit today:  Goals Addressed            This Visit's Progress   . Lifestyle Change-Hypertension       Timeframe:  Long-Range Goal Priority:  High Start Date:  06/08/20                           Expected End Date: 08/08/20                      Follow Up Date 07/09/20   - agree to work together to make changes - ask questions to understand - learn about high blood pressure  - continue to work on smoking cessation   Why is this important?    The changes that you are asked to make may be hard to do.   This is especially true when the changes are life-long.   Knowing why it is important to you is the first step.   Working on the change with your family or support person helps you not feel alone.   Reward yourself and family or support person when goals are met. This can be an activity you choose like bowling, hiking, biking, swimming or shooting hoops.         . Track and Manage My Blood Pressure-Hypertension       Timeframe:  Long-Range Goal Priority:  High Start Date:   06/08/20                          Expected End Date:   08/08/20                    Follow Up Date 07/09/20   - check blood pressure weekly - check blood pressure if feeling weak, dizzy or lightheaded - call PCP with abnormal BP readings (low or high) - choose a place to take my blood pressure (home, clinic or office, retail store) -  write blood pressure results in a log or diary  - call Medicaid Ombudsman [phone]) to discuss changes to covered medications    Why is this important?    You won't feel high blood pressure, but it can still hurt your blood vessels.   High blood pressure can cause heart or kidney problems. It can also cause a stroke.   Making lifestyle changes like losing a little weight or eating less salt will help.   Checking your blood pressure at home and at different times of the day can help to control blood pressure.   If the doctor prescribes medicine remember to take it the way the doctor ordered.   Call the office if you cannot afford the medicine or if there are questions about it.            Please see education materials related to HTN and smoking cessation provided as print materials.  Managing Your Hypertension Hypertension, also called high blood pressure, is when the force of the blood pressing against the walls of the arteries is too strong. Arteries are blood vessels that carry blood from your heart throughout your body. Hypertension forces the heart to work harder to pump blood and may cause the arteries to become narrow or stiff. Understanding blood pressure readings Your personal target blood pressure may vary depending on your medical conditions, your age, and other factors. A blood pressure reading includes a higher number over a lower number. Ideally, your blood pressure should be below 120/80. You should know that:  The first, or top, number is called the systolic pressure. It is a measure of the pressure in your arteries as your heart beats.  The second, or bottom number, is called the diastolic pressure. It is a measure of the pressure in your arteries as the heart relaxes. Blood pressure is classified into four stages. Based on your blood pressure reading, your health care provider may use the following stages to determine what type of treatment you need, if any.  Systolic pressure and diastolic pressure are measured in a unit called mmHg. Normal  Systolic pressure: below 062.  Diastolic pressure: below 80. Elevated  Systolic pressure: 376-283.  Diastolic pressure: below 80. Hypertension stage 1  Systolic pressure: 151-761.  Diastolic pressure: 60-73. Hypertension stage 2  Systolic pressure: 710 or above.  Diastolic pressure: 90 or above. How can this condition affect me? Managing your hypertension is an important responsibility. Over time, hypertension can damage the arteries and decrease blood flow to important parts of the body, including the brain, heart, and kidneys. Having untreated or uncontrolled hypertension can lead to:  A heart attack.  A stroke.  A weakened blood vessel (aneurysm).  Heart failure.  Kidney damage.  Eye damage.  Metabolic syndrome.  Memory and concentration problems.  Vascular dementia. What actions can I take to manage this condition? Hypertension can be managed by making lifestyle changes and possibly by taking medicines. Your health care provider will help you make a plan to bring your blood pressure within a normal range. Nutrition  Eat a diet that is high in fiber and potassium, and low in salt (sodium), added sugar, and fat. An example eating plan is called the Dietary Approaches to Stop Hypertension (DASH) diet. To eat this way: ? Eat plenty of fresh fruits and vegetables. Try to fill one-half of your plate at each meal with fruits and vegetables. ? Eat whole grains, such as whole-wheat pasta, brown rice, or whole-grain bread. Fill about one-fourth of your plate with whole grains. ? Eat low-fat dairy products. ? Avoid fatty cuts of meat, processed or cured meats, and poultry with skin. Fill about one-fourth of your plate with lean proteins such as fish, chicken without skin, beans, eggs, and tofu. ? Avoid pre-made and processed foods. These tend to be higher in sodium, added sugar, and  fat.  Reduce your daily sodium intake. Most people with hypertension should eat less than 1,500 mg of sodium a day.   Lifestyle  Work with your health care provider to maintain a healthy body weight or to lose weight. Ask what an ideal weight is for you.  Get at least 30 minutes of exercise that causes your heart to beat faster (aerobic exercise) most days of the week. Activities may include walking, swimming, or biking.  Include exercise to strengthen your muscles (resistance exercise), such as weight lifting, as part of your weekly exercise routine.  Try to do these types of exercises for 30 minutes at least 3 days a week.  Do not use any products that contain nicotine or tobacco, such as cigarettes, e-cigarettes, and chewing tobacco. If you need help quitting, ask your health care provider.  Control any long-term (chronic) conditions you have, such as high cholesterol or diabetes.  Identify your sources of stress and find ways to manage stress. This may include meditation, deep breathing, or making time for fun activities.   Alcohol use  Do not drink alcohol if: ? Your health care provider tells you not to drink. ? You are pregnant, may be pregnant, or are planning to become pregnant.  If you drink alcohol: ? Limit how much you use to:  0-1 drink a day for women.  0-2 drinks a day for men. ? Be aware of how much alcohol is in your drink. In the [place] one drink equals one 12 oz bottle of beer (355 mL), one 5 oz glass of wine (148 mL), or one 1 oz glass of hard liquor (44 mL). Medicines Your health care provider may prescribe medicine if lifestyle changes are not enough to get your blood pressure under control and if:  Your systolic blood pressure is 130 or higher.  Your diastolic blood pressure is 80 or higher. Take medicines only as told by your health care provider. Follow the directions carefully. Blood pressure medicines must be taken as told by your health care provider. The  medicine does not work as well when you skip doses. Skipping doses also puts you at risk for problems. Monitoring Before you monitor your blood pressure:  Do not smoke, drink caffeinated beverages, or exercise within 30 minutes before taking a measurement.  Use the bathroom and empty your bladder (urinate).  Sit quietly for at least 5 minutes before taking measurements. Monitor your blood pressure at home as told by your health care provider. To do this:  Sit with your back straight and supported.  Place your feet flat on the floor. Do not cross your legs.  Support your arm on a flat surface, such as a table. Make sure your upper arm is at heart level.  Each time you measure, take two or three readings one minute apart and record the results. You may also need to have your blood pressure checked regularly by your health care provider.   General information  Talk with your health care provider about your diet, exercise habits, and other lifestyle factors that may be contributing to hypertension.  Review all the medicines you take with your health care provider because there may be side effects or interactions.  Keep all visits as told by your health care provider. Your health care provider can help you create and adjust your plan for managing your high blood pressure. Where to find more information  National Heart, Lung, and Blood Institute: https://wilson-eaton.com/  American Heart Association: www.heart.org Contact a health care provider if:  You think you are having a reaction to medicines you have taken.  You have repeated (recurrent) headaches.  You feel dizzy.  You have swelling in your ankles.  You have trouble with your vision. Get help right away if:  You develop a severe headache or confusion.  You have unusual weakness or numbness, or you feel faint.  You have severe pain in your chest or abdomen.  You vomit repeatedly.  You have trouble breathing. These  symptoms may represent a serious problem that is an emergency. Do not  wait to see if the symptoms will go away. Get medical help right away. Call your local emergency services (911 in the [place] Do not drive yourself to the hospital. Summary  Hypertension is when the force of blood pumping through your arteries is too strong. If this condition is not controlled, it may put you at risk for serious complications.  Your personal target blood pressure may vary depending on your medical conditions, your age, and other factors. For most people, a normal blood pressure is less than 120/80.  Hypertension is managed by lifestyle changes, medicines, or both.  Lifestyle changes to help manage hypertension include losing weight, eating a healthy, low-sodium diet, exercising more, stopping smoking, and limiting alcohol. This information is not intended to replace advice given to you by your health care provider. Make sure you discuss any questions you have with your health care provider. Document Revised: 05/03/2019 Document Reviewed: 02/26/2019 Elsevier Patient Education  2021 Justice of Quitting Smoking Quitting smoking is a physical and mental challenge. You will face cravings, withdrawal symptoms, and temptation. Before quitting, work with your health care provider to make a plan that can help you manage quitting. Preparation can help you quit and keep you from giving in. How to manage lifestyle changes Managing stress Stress can make you want to smoke, and wanting to smoke may cause stress. It is important to find ways to manage your stress. You might try some of the following:  Practice relaxation techniques. ? Breathe slowly and deeply, in through your nose and out through your mouth. ? Listen to music. ? Soak in a bath or take a shower. ? Imagine a peaceful place or vacation.  Get some support. ? Talk with family or friends about your stress. ? Join a support  group. ? Talk with a counselor or therapist.  Get some physical activity. ? Go for a walk, run, or bike ride. ? Play a favorite sport. ? Practice yoga.   Medicines Talk with your health care provider about medicines that might help you deal with cravings and make quitting easier for you. Relationships Social situations can be difficult when you are quitting smoking. To manage this, you can:  Avoid parties and other social situations where people might be smoking.  Avoid alcohol.  Leave right away if you have the urge to smoke.  Explain to your family and friends that you are quitting smoking. Ask for support and let them know you might be a bit grumpy.  Plan activities where smoking is not an option. General instructions Be aware that many people gain weight after they quit smoking. However, not everyone does. To keep from gaining weight, have a plan in place before you quit and stick to the plan after you quit. Your plan should include:  Having healthy snacks. When you have a craving, it may help to: ? Eat popcorn, carrots, celery, or other cut vegetables. ? Chew sugar-free gum.  Changing how you eat. ? Eat small portion sizes at meals. ? Eat 4-6 small meals throughout the day instead of 1-2 large meals a day. ? Be mindful when you eat. Do not watch television or do other things that might distract you as you eat.  Exercising regularly. ? Make time to exercise each day. If you do not have time for a long workout, do short bouts of exercise for 5-10 minutes several times a day. ? Do some form of strengthening exercise, such as weight lifting. ?  Do some exercise that gets your heart beating and causes you to breathe deeply, such as walking fast, running, swimming, or biking. This is very important.  Drinking plenty of water or other low-calorie or no-calorie drinks. Drink 6-8 glasses of water daily.   How to recognize withdrawal symptoms Your body and mind may experience  discomfort as you try to get used to not having nicotine in your system. These effects are called withdrawal symptoms. They may include:  Feeling hungrier than normal.  Having trouble concentrating.  Feeling irritable or restless.  Having trouble sleeping.  Feeling depressed.  Craving a cigarette. To manage withdrawal symptoms:  Avoid places, people, and activities that trigger your cravings.  Remember why you want to quit.  Get plenty of sleep.  Avoid coffee and other caffeinated drinks. These may worsen some of your symptoms. These symptoms may surprise you. But be assured that they are normal to have when quitting smoking. How to manage cravings Come up with a plan for how to deal with your cravings. The plan should include the following:  A definition of the specific situation you want to deal with.  An alternative action you will take.  A clear idea for how this action will help.  The name of someone who might help you with this. Cravings usually last for 5-10 minutes. Consider taking the following actions to help you with your plan to deal with cravings:  Keep your mouth busy. ? Chew sugar-free gum. ? Suck on hard candies or a straw. ? Brush your teeth.  Keep your hands and body busy. ? Change to a different activity right away. ? Squeeze or play with a ball. ? Do an activity or a hobby, such as making bead jewelry, practicing needlepoint, or working with wood. ? Mix up your normal routine. ? Take a short exercise break. Go for a quick walk or run up and down stairs.  Focus on doing something kind or helpful for someone else.  Call a friend or family member to talk during a craving.  Join a support group.  Contact a quitline. Where to find support To get help or find a support group:  Call the [facility]'s Smoking Quitline: 1-800-QUIT NOW 4755166143)  Visit the website of the Substance Abuse and [place]:  ktimeonline.com  Text QUIT to SmokefreeTXT: 127517 Where to find more information Visit these websites to find more information on quitting smoking:  [place]: www.smokefree.gov  American Lung Association: www.lung.org  American Cancer Society: www.cancer.org  Centers for Disease Control and Prevention: http://www.wolf.info/  American Heart Association: www.heart.org Contact a health care provider if:  You want to change your plan for quitting.  The medicines you are taking are not helping.  Your eating feels out of control or you cannot sleep. Get help right away if:  You feel depressed or become very anxious. Summary  Quitting smoking is a physical and mental challenge. You will face cravings, withdrawal symptoms, and temptation to smoke again. Preparation can help you as you go through these challenges.  Try different techniques to manage stress, handle social situations, and prevent weight gain.  You can deal with cravings by keeping your mouth busy (such as by chewing gum), keeping your hands and body busy, calling family or friends, or contacting a quitline for people who want to quit smoking.  You can deal with withdrawal symptoms by avoiding places where people smoke, getting plenty of rest, and avoiding drinks with caffeine.  This information is not intended to replace advice given to you by your health care provider. Make sure you discuss any questions you have with your health care provider. Document Revised: 01/15/2019 Document Reviewed: 01/15/2019 Elsevier Patient Education  [place].   The patient verbalized understanding of instructions provided today and agreed to receive a mailed copy of patient instruction and/or educational materials.  Telephone follow up appointment with Managed Medicaid care management team member scheduled for:07/09/20 @ 3:30pm  Melissa Montane, RN  Following is a copy of your plan of care:  Patient Care Plan:  Hypertension (Adult)    Problem Identified: Hypertension (Hypertension)     Long-Range Goal: Hypertension Monitored   Start Date: 06/08/2020  Expected End Date: 08/08/2020  This Visit's Progress: On track  Priority: High  Note:   Current Barriers:  . Chronic Disease Management support and education needs related to HTN. Ms. Kendra Mullins has managed her HTN at home with medication since 2011. Recent changes to insurance has resulted in a change in her long term medication regimen from Cardizem to Diltiazem. The new medication makes her feel lightheaded and dizzy. She would like to switch back to taking Cardizem, but is unable to afford the out of pocket expense. She does not take her BP, because she does not have a BP monitor. She has contacted insurance and her provider regarding this issue and continues to have difficulty obtaining the medication that she feels best treats her BP.  Nurse Case Manager Clinical Goal(s):  Marland Kitchen Over the next 30 days, patient will verbalize understanding of plan for HTN . Over the next 30 days, patient will meet with RN Care Manager to address barriers to managing her health . Over the next 30 days, patient will attend all scheduled medical appointments . Over the next 30 days, patient will demonstrate improved health management independence as evidenced byobtaining a BP monitor, checking BP regularly, work on smoking cessation . Over the next 30 days, patient will work with CM team pharmacist to review medications  Interventions:  . Inter-disciplinary care team collaboration (see longitudinal plan of care) . Evaluation of current treatment plan related to HTN and patient's adherence to plan as established by provider. . Advised patient to call the [facility] [phone] for assistance . Provided education to patient re: HTN . Reviewed medications with patient and discussed taking medications as prescribed and the importance of checking BP  regularly . Discussed plans with patient for ongoing care management follow up and provided patient with direct contact information for care management team . Advised patient, providing education and rationale, to monitor blood pressure daily and record, calling PCP for findings outside established parameters.  . Pharmacy referral for medication management . Collaborate with PCP for BP monitor  Patient Goals/Self-Care Activities Over the next 30 days, patient will: - agree to work together to make changes - ask questions to understand - learn about high blood pressure  - continue to work on smoking cessation - check blood pressure weekly - check blood pressure if feeling weak, dizzy or lightheaded - call PCP with abnormal BP readings (low or high) - choose a place to take my blood pressure (home, clinic or office, retail store) - write blood pressure results in a log or diary  - call Medicaid Ombudsman [phone]) to discuss changes to covered medications  Follow Up Plan: Telephone follow up appointment with Managed Medicaid care management team member scheduled for:07/09/20 @ 3:30pm

## 2020-06-08 NOTE — Progress Notes (Signed)
I sent an Rx to [facility] in [facility] for a BP monitor.

## 2020-06-09 ENCOUNTER — Ambulatory Visit: Payer: Medicaid Other | Admitting: Internal Medicine

## 2020-06-09 DIAGNOSIS — Z419 Encounter for procedure for purposes other than remedying health state, unspecified: Secondary | ICD-10-CM | POA: Diagnosis not present

## 2020-06-15 ENCOUNTER — Other Ambulatory Visit: Payer: Medicaid Other

## 2020-06-23 DIAGNOSIS — S42202D Unspecified fracture of upper end of left humerus, subsequent encounter for fracture with routine healing: Secondary | ICD-10-CM | POA: Diagnosis not present

## 2020-06-26 ENCOUNTER — Telehealth: Payer: Self-pay

## 2020-06-26 ENCOUNTER — Other Ambulatory Visit: Payer: Self-pay

## 2020-06-26 NOTE — Patient Outreach (Signed)
Medicaid Managed Care    Pharmacy Note  06/26/2020 Name: Kendra Mullins MRN: [id number] DOB: Feb 05, 1961  Kendra Mullins is a 60 y.o. year old female who is a primary care patient of Army Melia Jesse Sans, MD. The [facility] Managed Care Coordination team was consulted for assistance with disease management and care coordination needs.    Engaged with patient Engaged with patient by telephone for initial visit in response to referral for case management and/or care coordination services.  Ms. Mikaelah Trostle was given information about Managed Medicaid Care Coordination team services today. [name] agreed to services and verbal consent obtained.   Objective:  Lab Results  Component Value Date   CREATININE 0.56 (L) 10/25/2019   CREATININE 0.62 12/06/2017   CREATININE 0.71 11/21/2016    No results found for: HGBA1C     Component Value Date/Time   CHOL 187 10/25/2019 1625   TRIG 40 10/25/2019 1625   HDL 129 10/25/2019 1625   CHOLHDL 1.4 10/25/2019 1625   LDLCALC 49 10/25/2019 1625    Other: (TSH, CBC, Vit D, etc.)  Clinical ASCVD: No  The ASCVD Risk score Mikey Bussing DC Jr., et al., 2013) failed to calculate for the following reasons:   The valid HDL cholesterol range is 20 to 100 mg/dL    Other: (CHADS2VASc if Afib, PHQ9 if depression, MMRC or CAT for COPD, ACT, DEXA)  BP Readings from Last 3 Encounters:  06/05/20 126/66  05/07/20 (!) 145/72  10/25/19 (!) 110/54    Assessment/Interventions: Review of patient past medical history, allergies, medications, health status, including review of consultants reports, laboratory and other test data, was performed as part of comprehensive evaluation and provision of chronic care management services.   HTN Benazapril 5mg  Diltiazem 240mg /24 Hours March 2022: As soon as patient answered the phone she said, "Let's get into this, if you can't help me there's no point wasting my time going over other things. Will go over other meds at  future visit... Was on Cardia LA 240mg  (Pill) for 10 years, switched to Tiazac (Diltiazem capsule) Jan 2022 and says it isn't working as well.  Christian at [facility] affirmed Pre-2022 per Pharmacy: Cardiazem LA 240mg , 24 Hours Post 2022 per Pharmacy: Tiazac ER 240mg  Plan: Will work with PCP to try to get previous version. Per fomulary, Capsules are preferred and patient would like tablets [email or web address]    SDOH (Social Determinants of Health) assessments and interventions performed:    Care Plan  Allergies  Allergen Reactions  . Azithromycin Nausea Only  . Codeine Nausea And Vomiting    Medications Reviewed Today    Reviewed by Melissa Montane, RN (Registered Nurse) on 06/08/20 at 1451  Med List Status: <None>  Medication Order Taking? Sig Documenting Provider Last Dose Status Informant  albuterol (VENTOLIN HFA) 108 (90 Base) MCG/ACT inhaler [id number] Yes Inhale 2 puffs into the lungs every 6 (six) hours as needed for wheezing or shortness of breath. Glean Hess, MD Taking Active   ALPRAZolam Duanne Moron) 0.5 MG tablet [id number] Yes Take 0.5 mg by mouth 2 (two) times daily as needed for anxiety. [provider] Taking Active Pharmacy Records  ASPIRIN LOW DOSE 81 MG EC tablet [id number] Yes TAKE 1 TABLET BY MOUTH ONCE DAILY Glean Hess, MD Taking Active   atorvastatin (LIPITOR) 40 MG tablet [id number] Yes Take 1 tablet (40 mg total) by mouth daily. Glean Hess, MD Taking Active   ATROVENT HFA 17 MCG/ACT inhaler [id number] Yes INHALE 2 PUFFS BY  MOUTH EVERY 6 HOURS AS NEEDED FOR WHEEZING Glean Hess, MD Taking Active   benazepril (LOTENSIN) 5 MG tablet [id number] Yes TAKE 1 TABLET(5 MG) BY MOUTH DAILY Glean Hess, MD Taking Active   diltiazem [facility]) 240 MG 24 hr capsule [id number] Yes Take 240 mg by mouth daily. [provider] Taking Active   fluticasone (FLONASE) 50 MCG/ACT nasal spray [id number] Yes SHAKE LIQUID AND USE 2  SPRAYS IN EACH NOSTRIL DAILY Glean Hess, MD Taking Active            Med Note Thamas Jaegers, MELANIE A   Mon Jun 08, 2020  2:49 PM) Taking as needed  nicotine (NICODERM CQ - DOSED IN MG/24 HOURS) 21 mg/24hr patch [id number] No Place 1 patch (21 mg total) onto the skin daily.  Patient not taking: No sig reported   Glean Hess, MD Not Taking Active   sertraline (ZOLOFT) 25 MG tablet [id number] Yes Take 25 mg by mouth daily. [provider] Taking Active   sulfamethoxazole-trimethoprim (BACTRIM DS) 800-160 MG tablet [id number] Yes Take 1 tablet by mouth 2 (two) times daily for 7 days. Glean Hess, MD Taking Active           Patient Active Problem List   Diagnosis Date Noted  . Status post hysterectomy 05/19/2020  . NSCLC of left lung ([place]) 01/29/2020  . Mass of upper lobe of left lung 05/15/2019  . Closed fracture of head of left humerus 05/15/2019  . Aortic atherosclerosis ([place]) 12/31/2017  . TIA (transient ischemic attack) 06/25/2017  . Localized primary osteoarthritis of hands, bilateral 05/02/2017  . Continuous chronic alcoholism ([place]) 09/15/2014  . Anxiety 09/15/2014  . Cough, persistent 09/15/2014  . Tobacco use disorder 09/15/2014  . Essential hypertension 09/15/2014  . Hot flash, menopausal 09/15/2014  . MI (mitral incompetence) 09/15/2014  . Centrilobular emphysema ([place]) 09/15/2014  . Low weight 09/15/2014    Conditions to be addressed/monitored: HTN  Care Plan : Medication Management  Updates made by Lane Hacker, [place] since 06/26/2020 12:00 AM    Problem: Health Promotion or Disease Self-Management (General Plan of Care)     Goal: Medication Coordination   Note:   Current Barriers:  . Was on Cardia LA 240mg  (Pill) for 10 years, switched to Diltiazem (capsule) Jan 2022 and says it isn't working as well .   Pharmacist Clinical Goal(s):  Marland Kitchen Over the next 30  days, patient will  Maintain contact  through collaboration with PharmD and provider.   .   Interventions: . Inter-disciplinary care team collaboration (see longitudinal plan of care) . Comprehensive medication review performed; medication list updated in electronic medical record  @RXCPHYPERTENSION @  Patient Goals/Self-Care Activities . Over the next 30 days, patient will:  - collaborate with provider on medication access solutions  Follow Up Plan: The care management team will reach out to the patient again over the next 30 days.     Task: Mutually Develop and Royce Macadamia Achievement of Patient Goals   Note:   Care Management Activities:    - verbalization of feelings encouraged    Notes:      Medication Assistance: Application for Diltiazem medication assistance program in process. Anticipated assistance start date Unknown. See plan of care above for additional detail.   Follow up: Agree/Does not agree  Plan: The care management team will reach out to the patient again over the next 30 days.   [place] Constable, Pharm.D., Managed Medicaid Pharmacist - [phone]

## 2020-06-26 NOTE — Telephone Encounter (Unsigned)
Copied from [place] [phone]. Topic: General - Other >> Jun 26, 2020  4:21 PM Pawlus, Apolonio Schneiders wrote: Ovid Curd a Pharmacist Physician requested a call back to discuss the pts meds [phone]), stated the pt wanted to see which medications she could have prescribed that would be covered by Medicare and not have to have a PA.

## 2020-06-26 NOTE — Patient Instructions (Signed)
Visit Information  Ms. Damita Dunnings Ward was given information about Medicaid Managed Care team care coordination services as a part of their [facility] Medicaid benefit. Eastyn Skalla Ward verbally consented to engagement with the [facility] Managed Care team.   For questions related to your [facility] health plan, please call: [phone]  If you would like to schedule transportation through your [facility] plan, please call the following number at least 2 days in advance of your appointment: [phone]   Call the [place] at [phone], at any time, 24 hours a day, 7 days a week. If you are in danger or need immediate medical attention call 911.  Ms. Damita Dunnings Ward - following are the goals we discussed in your visit today:  Goals Addressed            This Visit's Progress   . Manage My Medicine       Timeframe:  Short-Term Goal Priority:  High Start Date:                             Expected End Date:                       Follow Up Date: 1 Week   - call if I am sick and can't take my medicine    Why is this important?   . These steps will help you keep on track with your medicines.   Notes: Patient would like to go back on Diltiazem she was on Pre-2022. Will maintain contact with me until we get meds switched back (If possible due to formulary constraints)       Please see education materials related to HTN provided as print materials.   Patient verbalizes understanding of instructions provided today.   The Managed Medicaid care management team will reach out to the patient again over the next 30 days.   [place] Constable, Pharm.D., Managed Medicaid Pharmacist [phone]   Following is a copy of your plan of care:  Patient Care Plan: Hypertension (Adult)    Problem Identified: Hypertension (Hypertension)     Long-Range Goal: Hypertension Monitored   Start Date: 06/08/2020  Expected End Date: 08/08/2020  This Visit's Progress: On  track  Priority: High  Note:   Current Barriers:  . Chronic Disease Management support and education needs related to HTN. Ms. Damita Dunnings Ward has managed her HTN at home with medication since 2011. Recent changes to insurance has resulted in a change in her long term medication regimen from Cardizem to Diltiazem. The new medication makes her feel lightheaded and dizzy. She would like to switch back to taking Cardizem, but is unable to afford the out of pocket expense. She does not take her BP, because she does not have a BP monitor. She has contacted insurance and her provider regarding this issue and continues to have difficulty obtaining the medication that she feels best treats her BP.  Nurse Case Manager Clinical Goal(s):  Marland Kitchen Over the next 30 days, patient will verbalize understanding of plan for HTN . Over the next 30 days, patient will meet with RN Care Manager to address barriers to managing her health . Over the next 30 days, patient will attend all scheduled medical appointments . Over the next 30 days, patient will demonstrate improved health management independence as evidenced byobtaining a BP monitor, checking BP regularly, work on smoking cessation . Over the next 30  days, patient will work with CM team pharmacist to review medications  Interventions:  . Inter-disciplinary care team collaboration (see longitudinal plan of care) . Evaluation of current treatment plan related to HTN and patient's adherence to plan as established by provider. . Advised patient to call the [facility] [phone] for assistance . Provided education to patient re: HTN . Reviewed medications with patient and discussed taking medications as prescribed and the importance of checking BP regularly . Discussed plans with patient for ongoing care management follow up and provided patient with direct contact information for care management team . Advised patient, providing education and rationale, to monitor  blood pressure daily and record, calling PCP for findings outside established parameters.  . Pharmacy referral for medication management . Collaborate with PCP for BP monitor  Patient Goals/Self-Care Activities Over the next 30 days, patient will: - agree to work together to make changes - ask questions to understand - learn about high blood pressure  - continue to work on smoking cessation - check blood pressure weekly - check blood pressure if feeling weak, dizzy or lightheaded - call PCP with abnormal BP readings (low or high) - choose a place to take my blood pressure (home, clinic or office, retail store) - write blood pressure results in a log or diary  - call Medicaid Ombudsman [phone]) to discuss changes to covered medications  Follow Up Plan: Telephone follow up appointment with Managed Medicaid care management team member scheduled for:07/09/20 @ 3:30pm

## 2020-06-27 ENCOUNTER — Other Ambulatory Visit: Payer: Self-pay | Admitting: Internal Medicine

## 2020-06-27 DIAGNOSIS — J438 Other emphysema: Secondary | ICD-10-CM

## 2020-06-27 NOTE — Telephone Encounter (Signed)
Requested Prescriptions  Pending Prescriptions Disp Refills  . ATROVENT HFA 17 MCG/ACT inhaler [Pharmacy Med Name: ATROVENT HFA ORAL INHALER (200 INH)] 12.9 g 0    Sig: INHALE 2 PUFFS BY MOUTH EVERY 6 HOURS AS NEEDED FOR WHEEZING     Pulmonology:  Anticholinergic Agents Passed - 06/27/2020  8:50 AM      Passed - Valid encounter within last 12 months    Recent Outpatient Visits          3 weeks ago Acute cystitis without hematuria   [facility] Glean Hess, MD   8 months ago Essential hypertension   [facility] Glean Hess, MD   1 year ago COVID-19 virus infection   [facility] Glean Hess, MD   2 years ago Infected sebaceous cyst   [facility] Glean Hess, MD   2 years ago Essential (primary) hypertension   [facility] Glean Hess, MD      Future Appointments            In 3 months Army Melia Jesse Sans, MD [facility], [facility]

## 2020-06-29 ENCOUNTER — Other Ambulatory Visit: Payer: Self-pay | Admitting: Internal Medicine

## 2020-06-29 ENCOUNTER — Ambulatory Visit: Payer: Self-pay

## 2020-06-29 ENCOUNTER — Ambulatory Visit: Payer: Medicaid Other

## 2020-06-29 DIAGNOSIS — I10 Essential (primary) hypertension: Secondary | ICD-10-CM

## 2020-06-29 MED ORDER — DILTIAZEM HCL ER COATED BEADS 240 MG PO TB24: 240.0000 mg | ORAL_TABLET | Freq: Every day | ORAL | 0 refills | Status: DC

## 2020-06-29 NOTE — Progress Notes (Signed)
I just sent in the Diltiazem LA 240 to [facility].  I do not understand how medicaid will give her a 30 day supply but maybe it is [facility] that will do that.  Let me know the next step.

## 2020-06-29 NOTE — Telephone Encounter (Signed)
Dr Army Melia sent pharmacist a staff message letting him know we have done Pa's for the patient and they were denied. Patient wants name brand medications and Medicaid is not going to cover these.

## 2020-07-09 ENCOUNTER — Ambulatory Visit: Payer: Self-pay

## 2020-07-10 ENCOUNTER — Other Ambulatory Visit: Payer: Self-pay | Admitting: Internal Medicine

## 2020-07-10 DIAGNOSIS — Z419 Encounter for procedure for purposes other than remedying health state, unspecified: Secondary | ICD-10-CM | POA: Diagnosis not present

## 2020-07-17 ENCOUNTER — Other Ambulatory Visit: Payer: Self-pay

## 2020-07-17 ENCOUNTER — Telehealth: Payer: Self-pay | Admitting: Internal Medicine

## 2020-07-17 DIAGNOSIS — I10 Essential (primary) hypertension: Secondary | ICD-10-CM

## 2020-07-17 MED ORDER — DILTIAZEM HCL ER COATED BEADS 240 MG PO TB24: 240.0000 mg | ORAL_TABLET | Freq: Every day | ORAL | 0 refills | Status: DC

## 2020-07-17 NOTE — Telephone Encounter (Signed)
Pt called and stated that the pharmacist from [facility] contacted her and stated that there was an issue with the RX for diltiazem (CARDIZEM LA) 240 MG 24 hr tablet That Dr. Army Melia called in last Friday. /Pt seems unsure of what the actual issue is with this medication/   I spoke with Dr. Oneal Deputy nurse and she resent the Rx to [facility] in [place]/ it was sent to the wrong pharmacy   Pt was advise of RX being sent to correct pharmacy and she was not sure what the [facility] pharmacist was talking about

## 2020-07-22 ENCOUNTER — Other Ambulatory Visit: Payer: Medicaid Other

## 2020-07-22 ENCOUNTER — Other Ambulatory Visit: Payer: Self-pay | Admitting: Internal Medicine

## 2020-07-22 ENCOUNTER — Other Ambulatory Visit: Payer: Self-pay

## 2020-07-22 DIAGNOSIS — I10 Essential (primary) hypertension: Secondary | ICD-10-CM

## 2020-07-22 MED ORDER — DILTIAZEM HCL ER BEADS 240 MG PO CP24: 240.0000 mg | ORAL_CAPSULE | Freq: Every day | ORAL | 0 refills | Status: DC

## 2020-07-22 NOTE — Patient Outreach (Signed)
Medicaid Managed Care    Pharmacy Note  07/22/2020 Name: Kendra Mullins MRN: [id number] DOB: 1960/12/28  Kendra Mullins is a 60 y.o. year old female who is a primary care patient of Army Melia Jesse Sans, MD. The [facility] Managed Care Coordination team was consulted for assistance with disease management and care coordination needs.    Engaged with patient Engaged with patient by telephone for initial visit in response to referral for case management and/or care coordination services.  Kendra Mullins was given information about Managed Medicaid Care Coordination team services today. Kendra Mullins agreed to services and verbal consent obtained.   Objective:  Lab Results  Component Value Date   CREATININE 0.56 (L) 10/25/2019   CREATININE 0.62 12/06/2017   CREATININE 0.71 11/21/2016    No results found for: HGBA1C     Component Value Date/Time   CHOL 187 10/25/2019 1625   TRIG 40 10/25/2019 1625   HDL 129 10/25/2019 1625   CHOLHDL 1.4 10/25/2019 1625   LDLCALC 49 10/25/2019 1625    Other: (TSH, CBC, Vit D, etc.)  Clinical ASCVD: No  The ASCVD Risk score Mikey Bussing DC Jr., et al., 2013) failed to calculate for the following reasons:   The valid HDL cholesterol range is 20 to 100 mg/dL    Other: (CHADS2VASc if Afib, PHQ9 if depression, MMRC or CAT for COPD, ACT, DEXA)  BP Readings from Last 3 Encounters:  06/05/20 126/66  05/07/20 (!) 145/72  10/25/19 (!) 110/54    Assessment/Interventions: Review of patient past medical history, allergies, medications, health status, including review of consultants reports, laboratory and other test data, was performed as part of comprehensive evaluation and provision of chronic care management services.   HTN Benazapril 5mg  Diltiazem 240mg /24 Hours March 2022: As soon as patient answered the phone she said, "Let's get into this, if you can't help me there's no point wasting my time going over other things. Will go over other meds at  future visit... Was on Cardia LA 240mg  (Pill) for 10 years, switched to Tiazac (Diltiazem capsule) Jan 2022 and says it isn't working as well.  Christian at [facility] affirmed Pre-2022 per Pharmacy: Cardiazem LA 240mg , 24 Hours Tablet Post 2022 per Pharmacy: Tiazac ER 240mg  Capsule Plan: Will work with PCP to try to get previous version. Per fomulary, Capsules are preferred and patient would like tablets [email or web address] April 2022: Patient needs to try 2 of the following preferred agents  Cartia XT Capsule (branded generic for Cardizem CD) Dilt XR Capsule (branded generic for Dilacor XR) diltiazem ER 24 hour capsule (generic for Dilacor XR, Tiazac) *THIS IS WHAT SHE'S CURRENTLY ON* diltiazem tablet / CD capsule / ER 12 hour capsule (generic for Cardizem / CD / SR) Taztia XT Capsule (branded generic for Tiazac) Tiadylt ER Capsule Verapamil tablet / ER tablet (generic for [place] / SR)  Sent msg to provider asking for new script of 1 of the preferred. Will f/u when I hear from her.   SDOH (Social Determinants of Health) assessments and interventions performed:    Care Plan  Allergies  Allergen Reactions  . Azithromycin Nausea Only  . Codeine Nausea And Vomiting    Medications Reviewed Today    Reviewed by Melissa Montane, RN (Registered Nurse) on 06/08/20 at 1451  Med List Status: <None>  Medication Order Taking? Sig Documenting Provider Last Dose Status Informant  albuterol (VENTOLIN HFA) 108 (90 Base) MCG/ACT inhaler [id number] Yes Inhale 2 puffs into the lungs every 6 (six)  hours as needed for wheezing or shortness of breath. Glean Hess, MD Taking Active   ALPRAZolam Duanne Moron) 0.5 MG tablet [id number] Yes Take 0.5 mg by mouth 2 (two) times daily as needed for anxiety. [provider] Taking Active Pharmacy Records  ASPIRIN LOW DOSE 81 MG EC tablet [id number] Yes TAKE 1 TABLET BY MOUTH ONCE DAILY Glean Hess, MD Taking Active    atorvastatin (LIPITOR) 40 MG tablet [id number] Yes Take 1 tablet (40 mg total) by mouth daily. Glean Hess, MD Taking Active   ATROVENT HFA 17 MCG/ACT inhaler [id number] Yes INHALE 2 PUFFS BY MOUTH EVERY 6 HOURS AS NEEDED FOR WHEEZING Glean Hess, MD Taking Active   benazepril (LOTENSIN) 5 MG tablet [id number] Yes TAKE 1 TABLET(5 MG) BY MOUTH DAILY Glean Hess, MD Taking Active   diltiazem [facility]) 240 MG 24 hr capsule [id number] Yes Take 240 mg by mouth daily. [provider] Taking Active   fluticasone (FLONASE) 50 MCG/ACT nasal spray [id number] Yes SHAKE LIQUID AND USE 2 SPRAYS IN EACH NOSTRIL DAILY Glean Hess, MD Taking Active            Med Note Thamas Jaegers, MELANIE A   Mon Jun 08, 2020  2:49 PM) Taking as needed  nicotine (NICODERM CQ - DOSED IN MG/24 HOURS) 21 mg/24hr patch [id number] No Place 1 patch (21 mg total) onto the skin daily.  Patient not taking: No sig reported   Glean Hess, MD Not Taking Active   sertraline (ZOLOFT) 25 MG tablet [id number] Yes Take 25 mg by mouth daily. [provider] Taking Active   sulfamethoxazole-trimethoprim (BACTRIM DS) 800-160 MG tablet [id number] Yes Take 1 tablet by mouth 2 (two) times daily for 7 days. Glean Hess, MD Taking Active           Patient Active Problem List   Diagnosis Date Noted  . Status post hysterectomy 05/19/2020  . NSCLC of left lung ([place]) 01/29/2020  . Mass of upper lobe of left lung 05/15/2019  . Closed fracture of head of left humerus 05/15/2019  . Aortic atherosclerosis ([place]) 12/31/2017  . TIA (transient ischemic attack) 06/25/2017  . Localized primary osteoarthritis of hands, bilateral 05/02/2017  . Continuous chronic alcoholism ([place]) 09/15/2014  . Anxiety 09/15/2014  . Cough, persistent 09/15/2014  . Tobacco use disorder 09/15/2014  . Essential hypertension 09/15/2014  . Hot flash, menopausal 09/15/2014  . MI (mitral incompetence) 09/15/2014  . Centrilobular emphysema  ([place]) 09/15/2014  . Low weight 09/15/2014    Conditions to be addressed/monitored: HTN  Care Plan : Medication Management  Updates made by Lane Hacker, [place] since 06/26/2020 12:00 AM    Problem: Health Promotion or Disease Self-Management (General Plan of Care)     Goal: Medication Coordination   Note:   Current Barriers:  . Was on Cardia LA 240mg  (Pill) for 10 years, switched to Diltiazem (capsule) Jan 2022 and says it isn't working as well .   Pharmacist Clinical Goal(s):  Marland Kitchen Over the next 30  days, patient will  Maintain contact  through collaboration with PharmD and provider.  .   Interventions: . Inter-disciplinary care team collaboration (see longitudinal plan of care) . Comprehensive medication review performed; medication list updated in electronic medical record  @RXCPHYPERTENSION @  Patient Goals/Self-Care Activities . Over the next 30 days, patient will:  - collaborate with provider on medication access solutions  Follow Up Plan: The care management team will reach out to the patient again over  the next 30 days.     Task: Mutually Develop and Royce Macadamia Achievement of Patient Goals   Note:   Care Management Activities:    - verbalization of feelings encouraged    Notes:      Medication Assistance: Application for Diltiazem medication assistance program in process. Anticipated assistance start date Unknown. See plan of care above for additional detail.   Follow up: Agree  Plan: The care management team will reach out to the patient again over the next 30 days.   [place] Constable, Pharm.D., Managed Medicaid Pharmacist - [phone]

## 2020-07-26 ENCOUNTER — Other Ambulatory Visit: Payer: Self-pay | Admitting: Internal Medicine

## 2020-07-26 DIAGNOSIS — J438 Other emphysema: Secondary | ICD-10-CM

## 2020-07-26 NOTE — Telephone Encounter (Signed)
Requested Prescriptions  Pending Prescriptions Disp Refills  . ATROVENT HFA 17 MCG/ACT inhaler [Pharmacy Med Name: ATROVENT HFA ORAL INHALER (200 INH)] 12.9 g 0    Sig: INHALE 2 PUFFS BY MOUTH EVERY 6 HOURS AS NEEDED FOR WHEEZING     Pulmonology:  Anticholinergic Agents Passed - 07/26/2020  3:37 AM      Passed - Valid encounter within last 12 months    Recent Outpatient Visits          1 month ago Acute cystitis without hematuria   [facility] Glean Hess, MD   9 months ago Essential hypertension   [facility] Glean Hess, MD   1 year ago COVID-19 virus infection   [facility] Glean Hess, MD   2 years ago Infected sebaceous cyst   [facility] Glean Hess, MD   2 years ago Essential (primary) hypertension   [facility] Glean Hess, MD      Future Appointments            In 2 months Army Melia Jesse Sans, MD [facility], [facility]

## 2020-07-28 DIAGNOSIS — Z8781 Personal history of (healed) traumatic fracture: Secondary | ICD-10-CM | POA: Diagnosis not present

## 2020-07-28 DIAGNOSIS — M25512 Pain in left shoulder: Secondary | ICD-10-CM | POA: Diagnosis not present

## 2020-08-03 DIAGNOSIS — R911 Solitary pulmonary nodule: Secondary | ICD-10-CM | POA: Diagnosis not present

## 2020-08-03 DIAGNOSIS — J439 Emphysema, unspecified: Secondary | ICD-10-CM | POA: Diagnosis not present

## 2020-08-03 DIAGNOSIS — C3412 Malignant neoplasm of upper lobe, left bronchus or lung: Secondary | ICD-10-CM | POA: Diagnosis not present

## 2020-08-07 ENCOUNTER — Telehealth: Payer: Self-pay | Admitting: Internal Medicine

## 2020-08-07 NOTE — Telephone Encounter (Signed)
Please tell patient we do not give antibiotics without a visit.  She can schedule a same day Monday? Or go to urgent care if symptoms worsen. Please call patient to schedule.

## 2020-08-07 NOTE — Telephone Encounter (Signed)
Pt has last uti in feb 2022. Pt is calling and would like to know if dr berglund will send in abx without office visit. I told patient normally the md will not prescribed abx without an type of appt.. I was told to send message. The burning when urinating and frequency started yesterday also patient has foul urine smell. [employer] phone number [phone]

## 2020-08-07 NOTE — Telephone Encounter (Signed)
No antibiotics without an OV

## 2020-08-09 DIAGNOSIS — Z419 Encounter for procedure for purposes other than remedying health state, unspecified: Secondary | ICD-10-CM | POA: Diagnosis not present

## 2020-08-10 ENCOUNTER — Ambulatory Visit: Payer: Medicaid Other | Admitting: Internal Medicine

## 2020-08-20 DIAGNOSIS — F331 Major depressive disorder, recurrent, moderate: Secondary | ICD-10-CM | POA: Diagnosis not present

## 2020-08-27 ENCOUNTER — Other Ambulatory Visit: Payer: Self-pay | Admitting: Internal Medicine

## 2020-08-27 DIAGNOSIS — J438 Other emphysema: Secondary | ICD-10-CM

## 2020-08-31 ENCOUNTER — Other Ambulatory Visit: Payer: Self-pay | Admitting: Internal Medicine

## 2020-08-31 DIAGNOSIS — I10 Essential (primary) hypertension: Secondary | ICD-10-CM

## 2020-09-08 ENCOUNTER — Telehealth: Payer: Self-pay

## 2020-09-08 NOTE — Telephone Encounter (Signed)
Copied from [place] [phone]. Topic: General - Other >> Sep 08, 2020  1:37 PM Keene Breath wrote: Reason for CRM: Patient would like the nurse to call regarding her appt. Scheduled for Thursday.  Patient would really like to know if the doctor could call in some medication before then if possible.  Patient believes she has a UTI and would either like to be worked in sooner or get some medication.  Please advise.

## 2020-09-08 NOTE — Telephone Encounter (Signed)
Pt has an appt tomorrow 6/1 for UTI.  KP

## 2020-09-09 ENCOUNTER — Encounter: Payer: Self-pay | Admitting: Internal Medicine

## 2020-09-09 ENCOUNTER — Ambulatory Visit (INDEPENDENT_AMBULATORY_CARE_PROVIDER_SITE_OTHER): Payer: Medicaid Other | Admitting: Internal Medicine

## 2020-09-09 ENCOUNTER — Other Ambulatory Visit: Payer: Self-pay

## 2020-09-09 VITALS — BP 118/74 | HR 81 | Temp 98.1°F | Ht 66.0 in | Wt 77.0 lb

## 2020-09-09 DIAGNOSIS — Z23 Encounter for immunization: Secondary | ICD-10-CM

## 2020-09-09 DIAGNOSIS — S42292D Other displaced fracture of upper end of left humerus, subsequent encounter for fracture with routine healing: Secondary | ICD-10-CM

## 2020-09-09 DIAGNOSIS — Z419 Encounter for procedure for purposes other than remedying health state, unspecified: Secondary | ICD-10-CM | POA: Diagnosis not present

## 2020-09-09 DIAGNOSIS — R399 Unspecified symptoms and signs involving the genitourinary system: Secondary | ICD-10-CM

## 2020-09-09 LAB — POCT URINALYSIS DIPSTICK
Bilirubin, UA: NEGATIVE
Blood, UA: NEGATIVE
Glucose, UA: NEGATIVE
Ketones, UA: NEGATIVE
Leukocytes, UA: NEGATIVE
Nitrite, UA: NEGATIVE
Protein, UA: NEGATIVE
Spec Grav, UA: <=1.005 — AB (ref 1.010–1.025)
Urobilinogen, UA: 0.2 E.U./dL
pH, UA: 6.5 (ref 5.0–8.0)

## 2020-09-09 MED ORDER — NITROFURANTOIN MONOHYD MACRO 100 MG PO CAPS: 100.0000 mg | ORAL_CAPSULE | Freq: Two times a day (BID) | ORAL | 0 refills | Status: AC

## 2020-09-09 NOTE — Progress Notes (Signed)
Date:  09/09/2020   Name:  Kendra Mullins   DOB:  1960-11-16   MRN:  [id number]   Chief Complaint: Urinary Tract Infection (X1 week, burning, going to the bathroom a lot more, abdominal pain ) and Immunizations (shingles)  Dysuria  This is a new problem. The current episode started in the past 7 days. The problem occurs every urination. The problem has been unchanged. The quality of the pain is described as burning. The pain is mild. There has been no fever. Associated symptoms include frequency. Pertinent negatives include no chills, discharge, hematuria, nausea, sweats, urgency or vomiting.    Lab Results  Component Value Date   CREATININE 0.56 (L) 10/25/2019   BUN 9 10/25/2019   NA 137 10/25/2019   K 4.4 10/25/2019   CL 98 10/25/2019   CO2 23 10/25/2019   Lab Results  Component Value Date   CHOL 187 10/25/2019   HDL 129 10/25/2019   LDLCALC 49 10/25/2019   TRIG 40 10/25/2019   CHOLHDL 1.4 10/25/2019   Lab Results  Component Value Date   TSH 0.797 10/25/2019   No results found for: HGBA1C Lab Results  Component Value Date   WBC 7.5 10/25/2019   HGB 13.7 10/25/2019   HCT 40.2 10/25/2019   MCV 96 10/25/2019   PLT 319 10/25/2019   Lab Results  Component Value Date   ALT 22 10/25/2019   AST 25 10/25/2019   ALKPHOS 116 10/25/2019   BILITOT 0.5 10/25/2019     Review of Systems  Constitutional: Negative for chills, fatigue and fever.  Respiratory: Negative for chest tightness and shortness of breath.   Cardiovascular: Negative for chest pain.  Gastrointestinal: Negative for abdominal pain, nausea and vomiting.  Genitourinary: Positive for dysuria and frequency. Negative for hematuria and urgency.  Musculoskeletal: Positive for back pain. Negative for myalgias.    Patient Active Problem List   Diagnosis Date Noted  . Status post hysterectomy 05/19/2020  . NSCLC of left lung ([place]) 01/29/2020  . Mass of upper lobe of left lung 05/15/2019  . Closed fracture  of head of left humerus 05/15/2019  . Aortic atherosclerosis ([place]) 12/31/2017  . TIA (transient ischemic attack) 06/25/2017  . Localized primary osteoarthritis of hands, bilateral 05/02/2017  . Continuous chronic alcoholism ([place]) 09/15/2014  . Anxiety 09/15/2014  . Cough, persistent 09/15/2014  . Tobacco use disorder 09/15/2014  . Essential hypertension 09/15/2014  . Hot flash, menopausal 09/15/2014  . MI (mitral incompetence) 09/15/2014  . Centrilobular emphysema ([place]) 09/15/2014  . Low weight 09/15/2014    Allergies  Allergen Reactions  . Azithromycin Nausea Only  . Codeine Nausea And Vomiting    Past Surgical History:  Procedure Laterality Date  . ABDOMINAL HYSTERECTOMY  1986    Social History   Tobacco Use  . Smoking status: Current Every Day Smoker    Packs/day: 0.50    Types: Cigarettes  . Smokeless tobacco: Never Used  . Tobacco comment: using generic nicodern cq patches to try to quit  Vaping Use  . Vaping Use: Every day  Substance Use Topics  . Alcohol use: Yes    Alcohol/week: 50.0 standard drinks    Types: 50 Standard drinks or equivalent per week  . Drug use: No     Medication list has been reviewed and updated.  Current Meds  Medication Sig  . albuterol (VENTOLIN HFA) 108 (90 Base) MCG/ACT inhaler Inhale 2 puffs into the lungs every 6 (six) hours as needed for  wheezing or shortness of breath.  . ALPRAZolam (XANAX) 0.5 MG tablet Take 0.5 mg by mouth 2 (two) times daily as needed for anxiety.  . ASPIRIN LOW DOSE 81 MG EC tablet TAKE 1 TABLET BY MOUTH ONCE DAILY  . atorvastatin (LIPITOR) 40 MG tablet Take 1 tablet (40 mg total) by mouth daily.  . ATROVENT HFA 17 MCG/ACT inhaler INHALE 2 PUFFS BY MOUTH EVERY 6 HOURS AS NEEDED FOR WHEEZING  . benazepril (LOTENSIN) 5 MG tablet TAKE 1 TABLET(5 MG) BY MOUTH DAILY  . Blood Pressure Monitoring (BLOOD PRESSURE MONITOR AUTOMAT) DEVI 1 each by Does not apply route daily at 6 (six) AM. Use to check BP daily.  Marland Kitchen  diltiazem (TIAZAC) 240 MG 24 hr capsule TAKE 1 CAPSULE(240 MG) BY MOUTH DAILY  . fluticasone (FLONASE) 50 MCG/ACT nasal spray SHAKE LIQUID AND USE 2 SPRAYS IN EACH NOSTRIL DAILY  . sertraline (ZOLOFT) 25 MG tablet Take 25 mg by mouth daily.    PHQ 2/9 Scores 09/09/2020 06/05/2020 10/25/2019 04/16/2019  PHQ - 2 Score 2 2 4 2   PHQ- 9 Score 4 3 4 2     GAD 7 : Generalized Anxiety Score 09/09/2020 06/05/2020 10/25/2019  Nervous, Anxious, on Edge 1 1 2   Control/stop worrying 1 1 2   Worry too much - different things 1 1 2   Trouble relaxing 1 0 0  Restless 1 0 0  Easily annoyed or irritable 1 0 0  Afraid - awful might happen 0 0 0  Total GAD 7 Score 6 3 6   Anxiety Difficulty - - Not difficult at all    BP Readings from Last 3 Encounters:  09/09/20 118/74  06/05/20 126/66  05/07/20 (!) 145/72    Physical Exam Vitals and nursing note reviewed.  Constitutional:      General: She is not in acute distress.    Appearance: She is well-developed.  HENT:     Head: Normocephalic and atraumatic.  Cardiovascular:     Rate and Rhythm: Normal rate and regular rhythm.  Pulmonary:     Effort: Pulmonary effort is normal. No respiratory distress.     Breath sounds: No stridor. No wheezing.  Abdominal:     General: Abdomen is flat.     Tenderness: There is abdominal tenderness.  Musculoskeletal:     Cervical back: Normal range of motion.  Skin:    General: Skin is warm and dry.     Findings: No rash.  Neurological:     Mental Status: She is alert and oriented to person, place, and time.  Psychiatric:        Mood and Affect: Mood normal.        Behavior: Behavior normal.     Wt Readings from Last 3 Encounters:  09/09/20 77 lb (34.9 kg)  06/05/20 82 lb (37.2 kg)  10/25/19 82 lb (37.2 kg)    BP 118/74   Pulse 81   Temp 98.1 F (36.7 C) (Oral)   Ht 5\' 6"  (1.676 m)   Wt 77 lb (34.9 kg)   SpO2 99%   BMI 12.43 kg/m   Assessment and Plan: 1. UTI symptoms Likely mostly treated at home but  at risk for recurrence - POCT urinalysis dipstick - nitrofurantoin, macrocrystal-monohydrate, (MACROBID) 100 MG capsule; Take 1 capsule (100 mg total) by mouth 2 (two) times daily for 7 days.  Dispense: 14 capsule; Refill: 0  2. Closed fracture of head of left humerus with routine healing, subsequent encounter Planning hardware removal in a  few weeks  3. Need for shingles vaccine First dose today - Varicella-zoster vaccine IM   Partially dictated using Editor, commissioning. Any errors are unintentional.  Halina Maidens, MD [facility]  09/09/2020

## 2020-09-10 ENCOUNTER — Ambulatory Visit: Payer: Medicaid Other | Admitting: Internal Medicine

## 2020-09-18 DIAGNOSIS — F17290 Nicotine dependence, other tobacco product, uncomplicated: Secondary | ICD-10-CM | POA: Diagnosis not present

## 2020-09-18 DIAGNOSIS — J432 Centrilobular emphysema: Secondary | ICD-10-CM | POA: Diagnosis not present

## 2020-09-18 DIAGNOSIS — S42202D Unspecified fracture of upper end of left humerus, subsequent encounter for fracture with routine healing: Secondary | ICD-10-CM | POA: Diagnosis not present

## 2020-09-18 DIAGNOSIS — Z716 Tobacco abuse counseling: Secondary | ICD-10-CM | POA: Diagnosis not present

## 2020-09-18 DIAGNOSIS — Z7982 Long term (current) use of aspirin: Secondary | ICD-10-CM | POA: Diagnosis not present

## 2020-09-18 DIAGNOSIS — I10 Essential (primary) hypertension: Secondary | ICD-10-CM | POA: Diagnosis not present

## 2020-09-18 DIAGNOSIS — F1010 Alcohol abuse, uncomplicated: Secondary | ICD-10-CM | POA: Diagnosis not present

## 2020-09-18 DIAGNOSIS — Z472 Encounter for removal of internal fixation device: Secondary | ICD-10-CM | POA: Diagnosis not present

## 2020-09-18 DIAGNOSIS — Z79899 Other long term (current) drug therapy: Secondary | ICD-10-CM | POA: Diagnosis not present

## 2020-09-18 DIAGNOSIS — Z972 Presence of dental prosthetic device (complete) (partial): Secondary | ICD-10-CM | POA: Diagnosis not present

## 2020-09-18 DIAGNOSIS — F1290 Cannabis use, unspecified, uncomplicated: Secondary | ICD-10-CM | POA: Diagnosis not present

## 2020-09-18 DIAGNOSIS — I69351 Hemiplegia and hemiparesis following cerebral infarction affecting right dominant side: Secondary | ICD-10-CM | POA: Diagnosis not present

## 2020-09-18 DIAGNOSIS — E785 Hyperlipidemia, unspecified: Secondary | ICD-10-CM | POA: Diagnosis not present

## 2020-09-18 DIAGNOSIS — I69392 Facial weakness following cerebral infarction: Secondary | ICD-10-CM | POA: Diagnosis not present

## 2020-09-19 ENCOUNTER — Other Ambulatory Visit: Payer: Self-pay | Admitting: Internal Medicine

## 2020-09-19 DIAGNOSIS — J438 Other emphysema: Secondary | ICD-10-CM

## 2020-09-19 NOTE — Telephone Encounter (Signed)
Requested medication (s) are due for refill today: no  Requested medication (s) are on the active medication list: yes  Last refill:  08/27/20 12.9 g  Future visit scheduled: yes on 10/07/20  Notes to clinic:  "Must attend upcoming appt for further refills." Last RF will not last until upcoming appt, 10 days short   Requested Prescriptions  Pending Prescriptions Disp Refills   ATROVENT HFA 17 MCG/ACT inhaler [Pharmacy Med Name: ATROVENT HFA ORAL INHALER (200 INH)] 12.9 g 0    Sig: INHALE 2 PUFFS BY MOUTH EVERY 6 HOURS AS NEEDED FOR WHEEZING      Pulmonology:  Anticholinergic Agents Passed - 09/19/2020  1:12 PM      Passed - Valid encounter within last 12 months    Recent Outpatient Visits           1 week ago UTI symptoms   [facility], [facility]   3 months ago Acute cystitis without hematuria   [facility] Glean Hess, [facility]   11 months ago Essential hypertension   [facility] Glean Hess, MD   1 year ago COVID-19 virus infection   [facility] Glean Hess, MD   2 years ago Infected sebaceous cyst   [facility] Glean Hess, MD       Future Appointments             In 2 weeks Army Melia Jesse Sans, MD [facility], [facility]

## 2020-10-07 ENCOUNTER — Encounter: Payer: Medicaid Other | Admitting: Internal Medicine

## 2020-10-09 DIAGNOSIS — Z419 Encounter for procedure for purposes other than remedying health state, unspecified: Secondary | ICD-10-CM | POA: Diagnosis not present

## 2020-10-19 ENCOUNTER — Other Ambulatory Visit: Payer: Self-pay | Admitting: Internal Medicine

## 2020-10-19 DIAGNOSIS — J438 Other emphysema: Secondary | ICD-10-CM

## 2020-10-24 ENCOUNTER — Other Ambulatory Visit: Payer: Self-pay | Admitting: Internal Medicine

## 2020-10-24 NOTE — Telephone Encounter (Signed)
Requested Prescriptions  Pending Prescriptions Disp Refills  . fluticasone (FLONASE) 50 MCG/ACT nasal spray [Pharmacy Med Name: FLUTICASONE 50MCG NASAL SP (120) RX] 48 g 0    Sig: SHAKE LIQUID AND USE 2 SPRAYS IN EACH NOSTRIL DAILY     Ear, Nose, and Throat: Nasal Preparations - Corticosteroids Passed - 10/24/2020  3:34 AM      Passed - Valid encounter within last 12 months    Recent Outpatient Visits          1 month ago UTI symptoms   [facility] Glean Hess, MD   4 months ago Acute cystitis without hematuria   [facility] Glean Hess, MD   1 year ago Essential hypertension   [facility] Glean Hess, MD   1 year ago COVID-19 virus infection   [facility] Glean Hess, MD   2 years ago Infected sebaceous cyst   [facility] Glean Hess, MD

## 2020-11-09 DIAGNOSIS — Z419 Encounter for procedure for purposes other than remedying health state, unspecified: Secondary | ICD-10-CM | POA: Diagnosis not present

## 2020-11-10 ENCOUNTER — Telehealth: Payer: Self-pay | Admitting: Internal Medicine

## 2020-11-10 NOTE — Telephone Encounter (Signed)
Pt called in to request a call back to discuss further. Pt says that she is currently experiencing UTI symptoms. Pt says that she is having frequent urination but is not releasing a lot. Offered /tried to assist pt with scheduling. Pt says that none of the openings that provider have this week will work for her due to transportation. Pt would like to make provider aware of her symptoms and would like to know if possible could provider send in a Rx for her to her pharmacy?     CB: [phone]

## 2020-11-11 ENCOUNTER — Ambulatory Visit: Payer: Medicaid Other | Admitting: Internal Medicine

## 2020-12-03 ENCOUNTER — Telehealth: Payer: Self-pay

## 2020-12-03 ENCOUNTER — Ambulatory Visit: Payer: Medicaid Other

## 2020-12-03 NOTE — Telephone Encounter (Signed)
Copied from [place] [id number]. Topic: General - Other >> Dec 03, 2020  2:37 PM Bayard Beaver wrote: Reason for [id number] wife called in states husband was diagnosed with covid Tuesday, wants the nurse to call back to discusee this. I did tell her as long as she hasnt been diagnosed in the last 10 days she is ok to come in.

## 2020-12-03 NOTE — Telephone Encounter (Signed)
Called pt let her know that she could still come in to get the second shingrix vaccine if she does not have any symptoms. Pt verbalized understanding.  KP

## 2020-12-04 ENCOUNTER — Other Ambulatory Visit: Payer: Self-pay | Admitting: Internal Medicine

## 2020-12-04 ENCOUNTER — Ambulatory Visit: Payer: Medicaid Other

## 2020-12-04 DIAGNOSIS — I10 Essential (primary) hypertension: Secondary | ICD-10-CM

## 2020-12-04 DIAGNOSIS — I700 Atherosclerosis of aorta: Secondary | ICD-10-CM

## 2020-12-04 NOTE — Telephone Encounter (Signed)
Requested medications are due for refill today yes  Requested medications are on the active medication list yes  Last refill 10/31/20  Last visit 06/05/20, last lipid labs 10/25/19  Future visit scheduled 12/18/20  Notes to clinic Failed protocol due to no valid labs within 360 days.

## 2020-12-04 NOTE — Telephone Encounter (Signed)
Noted  KP 

## 2020-12-04 NOTE — Telephone Encounter (Signed)
Pt called in to cancel appt. She states that has covid related symptoms. Please advise.

## 2020-12-07 ENCOUNTER — Other Ambulatory Visit: Payer: Self-pay

## 2020-12-07 ENCOUNTER — Telehealth (INDEPENDENT_AMBULATORY_CARE_PROVIDER_SITE_OTHER): Payer: Medicaid Other | Admitting: Internal Medicine

## 2020-12-07 ENCOUNTER — Telehealth: Payer: Self-pay

## 2020-12-07 ENCOUNTER — Encounter: Payer: Self-pay | Admitting: Internal Medicine

## 2020-12-07 VITALS — Temp 100.6°F | Ht 66.0 in

## 2020-12-07 DIAGNOSIS — U071 COVID-19: Secondary | ICD-10-CM

## 2020-12-07 DIAGNOSIS — J438 Other emphysema: Secondary | ICD-10-CM

## 2020-12-07 MED ORDER — PROMETHAZINE-DM 6.25-15 MG/5ML PO SYRP: 5.0000 mL | ORAL_SOLUTION | Freq: Four times a day (QID) | ORAL | 0 refills | Status: DC | PRN

## 2020-12-07 MED ORDER — MOLNUPIRAVIR EUA 200MG CAPSULE: 4.0000 | ORAL_CAPSULE | Freq: Two times a day (BID) | ORAL | 0 refills | Status: AC

## 2020-12-07 MED ORDER — ALBUTEROL SULFATE HFA 108 (90 BASE) MCG/ACT IN AERS: 2.0000 | INHALATION_SPRAY | Freq: Four times a day (QID) | RESPIRATORY_TRACT | 2 refills | Status: DC | PRN

## 2020-12-07 NOTE — Telephone Encounter (Signed)
Copied from [place] [id number]. Topic: General - Other >> Dec 07, 2020 10:47 AM Leward Quan A wrote: Reason for CRM: Patient called in to inform dr Army Melia that she tested positive for Covid on Saturday 12/05/20 started having symptoms on Friday. Asking for Rx for Paxlovid to be sent to her pharmacy please. Can be reached at  Ph# [phone]

## 2020-12-07 NOTE — Progress Notes (Addendum)
Date:  12/07/2020   Name:  Kendra Mullins   DOB:  05/30/1960   MRN:  [id number]  This encounter was conducted via telephone due to the need for social distancing in light of the Covid-19 pandemic.  The patient was correctly identified.  I advised that I am conducting the visit from a secure room in my office at [facility] clinic.  The patient is located at home. The limitations of this form of encounter were discussed with the patient and he/she agreed to proceed.  Some vital signs will be absent. The visit was conducted entirely by phone as the patient does not have internet capability.  Chief Complaint: Covid Positive (Tested positive 9/27, Headache, diarrhea, congestion, fever and chill, runny nose, risk factor 3 )  HPI Covid positive - home test two days ago.  Symptoms started three days ago.  Her husband had covid last week but is recovering.  She has not been vaccinated.  She is most bothered by the cough and now headache.  She is taking tylenol regularly and fever will come down.  Lab Results  Component Value Date   CREATININE 0.56 (L) 10/25/2019   BUN 9 10/25/2019   NA 137 10/25/2019   K 4.4 10/25/2019   CL 98 10/25/2019   CO2 23 10/25/2019   Lab Results  Component Value Date   CHOL 187 10/25/2019   HDL 129 10/25/2019   LDLCALC 49 10/25/2019   TRIG 40 10/25/2019   CHOLHDL 1.4 10/25/2019   Lab Results  Component Value Date   TSH 0.797 10/25/2019   No results found for: HGBA1C Lab Results  Component Value Date   WBC 7.5 10/25/2019   HGB 13.7 10/25/2019   HCT 40.2 10/25/2019   MCV 96 10/25/2019   PLT 319 10/25/2019   Lab Results  Component Value Date   ALT 22 10/25/2019   AST 25 10/25/2019   ALKPHOS 116 10/25/2019   BILITOT 0.5 10/25/2019     Review of Systems  Constitutional:  Positive for chills, fatigue and fever. Negative for appetite change.  HENT:  Positive for congestion and rhinorrhea.   Respiratory:  Positive for cough, shortness of breath  and wheezing.   Cardiovascular:  Negative for chest pain and leg swelling.  Gastrointestinal:  Positive for diarrhea. Negative for abdominal pain, blood in stool and vomiting.  Neurological:  Positive for headaches.  Psychiatric/Behavioral:  Positive for sleep disturbance. Negative for dysphoric mood. The patient is not nervous/anxious.    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[place]) 01/29/2020   Mass of upper lobe of left lung 05/15/2019   Closed fracture of head of left humerus 05/15/2019   Aortic atherosclerosis ([place]) 12/31/2017   TIA (transient ischemic attack) 06/25/2017   Localized primary osteoarthritis of hands, bilateral 05/02/2017   Continuous chronic alcoholism ([place]) 09/15/2014   Anxiety 09/15/2014   Cough, persistent 09/15/2014   Tobacco use disorder 09/15/2014   Essential hypertension 09/15/2014   Hot flash, menopausal 09/15/2014   MI (mitral incompetence) 09/15/2014   Centrilobular emphysema ([place]) 09/15/2014   Low weight 09/15/2014    Allergies  Allergen Reactions   Azithromycin Nausea Only   Codeine Nausea And Vomiting    Past Surgical History:  Procedure Laterality Date   ABDOMINAL HYSTERECTOMY  1986   one ovary remains    Social History   Tobacco Use   Smoking status: Every Day  Packs/day: 0.50    Types: Cigarettes   Smokeless tobacco: Never   Tobacco comments:    using generic nicodern cq patches to try to quit  Vaping Use   Vaping Use: Every day  Substance Use Topics   Alcohol use: Yes    Alcohol/week: 50.0 standard drinks    Types: 50 Standard drinks or equivalent per week   Drug use: No     Medication list has been reviewed and updated.  Current Meds  Medication Sig   albuterol (VENTOLIN HFA) 108 (90 Base) MCG/ACT inhaler Inhale 2 puffs into the lungs every 6 (six) hours as needed for wheezing or shortness of breath.   ALPRAZolam (XANAX) 0.5 MG tablet Take 0.5 mg  by mouth 2 (two) times daily as needed for anxiety.   ASPIRIN LOW DOSE 81 MG EC tablet TAKE 1 TABLET BY MOUTH ONCE DAILY   atorvastatin (LIPITOR) 40 MG tablet TAKE 1 TABLET(40 MG) BY MOUTH DAILY   ATROVENT HFA 17 MCG/ACT inhaler INHALE 2 PUFFS BY MOUTH EVERY 6 HOURS AS NEEDED FOR WHEEZING   benazepril (LOTENSIN) 5 MG tablet TAKE 1 TABLET(5 MG) BY MOUTH DAILY   Blood Pressure Monitoring (BLOOD PRESSURE MONITOR AUTOMAT) DEVI 1 each by Does not apply route daily at 6 (six) AM. Use to check BP daily.   diltiazem (TIAZAC) 240 MG 24 hr capsule TAKE 1 CAPSULE(240 MG) BY MOUTH DAILY   fluticasone (FLONASE) 50 MCG/ACT nasal spray SHAKE LIQUID AND USE 2 SPRAYS IN EACH NOSTRIL DAILY   sertraline (ZOLOFT) 25 MG tablet Take 25 mg by mouth daily.    PHQ 2/9 Scores 12/07/2020 09/09/2020 06/05/2020 10/25/2019  PHQ - 2 Score 2 2 2 4   PHQ- 9 Score 4 4 3 4     GAD 7 : Generalized Anxiety Score 12/07/2020 09/09/2020 06/05/2020 10/25/2019  Nervous, Anxious, on Edge 0 1 1 2   Control/stop worrying 0 1 1 2   Worry too much - different things 0 1 1 2   Trouble relaxing 0 1 0 0  Restless 0 1 0 0  Easily annoyed or irritable 0 1 0 0  Afraid - awful might happen 0 0 0 0  Total GAD 7 Score 0 6 3 6   Anxiety Difficulty - - - Not difficult at all    BP Readings from Last 3 Encounters:  09/09/20 118/74  06/05/20 126/66  05/07/20 (!) 145/72    Physical Exam Pulmonary:     Comments: Occasional cough but no dyspnea noted during the call Neurological:     Mental Status: She is alert.  Psychiatric:        Attention and Perception: Attention normal.        Mood and Affect: Mood normal.        Speech: Speech normal.        Cognition and Memory: Cognition normal.    Wt Readings from Last 3 Encounters:  09/09/20 77 lb (34.9 kg)  06/05/20 82 lb (37.2 kg)  10/25/19 82 lb (37.2 kg)    Temp (!) 100.6 F (38.1 C) (Oral)   Ht 5\' 6"  (1.676 m)   BMI 12.43 kg/m   Assessment and Plan: 1. COVID-19 virus infection Pt is  not vaccinated and has an increased risk of complications of 3. She will continue tylenol OTC, fluids and rest. Quarantine discussed.  Indications for seeking care in person discussed. - molnupiravir EUA 200 mg CAPS; Take 4 capsules (800 mg total) by mouth 2 (two) times daily for 5 days.  Dispense:  40 capsule; Refill: 0 - promethazine-dextromethorphan (PROMETHAZINE-DM) 6.25-15 MG/5ML syrup; Take 5 mLs by mouth 4 (four) times daily as needed for cough.  Dispense: 118 mL; Refill: 0  2. Other emphysema ([facility]) - albuterol (VENTOLIN HFA) 108 (90 Base) MCG/ACT inhaler; Inhale 2 puffs into the lungs every 6 (six) hours as needed for wheezing or shortness of breath.  Dispense: 8 g; Refill: 2  I spent 11.5 minutes on this encounter. Partially dictated using Editor, commissioning. Any errors are unintentional.  Halina Maidens, MD [facility]  12/07/2020

## 2020-12-07 NOTE — Telephone Encounter (Signed)
Called pt let her know that she does not need to retest to get a negative result. Told pt that she could test positive for 90 days. Pt verbalized understanding.  KP

## 2020-12-07 NOTE — Telephone Encounter (Signed)
Pt has an appt today 12/07/2020.   KP

## 2020-12-07 NOTE — Telephone Encounter (Signed)
Copied from [place] [id number]. Topic: General - Inquiry >> Dec 07, 2020 12:57 PM Loma [place] wrote: Pt was virtual  with appt  today, with Covid positive, pt  now is wanting a call in to [facility] to order a  couple Covid test. The staff at [employer] may cover cost so pt was wanting Dr B to call them in as well as the other scrips already called in.FU call requested when done as picking up all meds at one time. [phone]

## 2020-12-10 ENCOUNTER — Telehealth: Payer: Self-pay

## 2020-12-10 DIAGNOSIS — Z419 Encounter for procedure for purposes other than remedying health state, unspecified: Secondary | ICD-10-CM | POA: Diagnosis not present

## 2020-12-10 NOTE — Telephone Encounter (Signed)
Copied from [place] [id number]. Topic: General - Other >> Dec 10, 2020  9:16 AM Tessa Lerner A wrote: Reason for CRM: The patient shares that they've tested positive for COVID 19 on Sunday 12/06/20  The patient shares that they are continuing to experience urinary discomfort, previously discussed, and feel that they are unable to continue being treated via phone appts   The patient would like to know if it is possible to receive a prescription to help with frequent urination, uncomfortable sensations and general discomfort in their personal are   Please contact further when possible

## 2020-12-10 NOTE — Telephone Encounter (Signed)
Please review.  KP

## 2020-12-10 NOTE — Telephone Encounter (Signed)
Pt declined virtual visit, pt just wants to speak with the nurse / wants medication sent in, please advise.

## 2020-12-11 ENCOUNTER — Other Ambulatory Visit: Payer: Self-pay

## 2020-12-11 ENCOUNTER — Encounter: Payer: Self-pay | Admitting: Internal Medicine

## 2020-12-11 ENCOUNTER — Encounter: Payer: Medicaid Other | Admitting: Internal Medicine

## 2020-12-11 ENCOUNTER — Telehealth: Payer: Self-pay

## 2020-12-11 DIAGNOSIS — R399 Unspecified symptoms and signs involving the genitourinary system: Secondary | ICD-10-CM

## 2020-12-11 DIAGNOSIS — J438 Other emphysema: Secondary | ICD-10-CM

## 2020-12-11 MED ORDER — NITROFURANTOIN MONOHYD MACRO 100 MG PO CAPS: 100.0000 mg | ORAL_CAPSULE | Freq: Two times a day (BID) | ORAL | 0 refills | Status: AC

## 2020-12-11 NOTE — Telephone Encounter (Signed)
Called pt left VM to call back. Need to get more details. How long has this been going on? And burning or itching? Etc  [facility] nurse may give results to patient if they return call to clinic, a CRM has been created.  KP

## 2020-12-11 NOTE — Telephone Encounter (Signed)
Duplicate   KP 

## 2020-12-11 NOTE — Telephone Encounter (Signed)
Pt having UTI symptoms for 3 days. Burning, hurting and urinary frequency.  KP

## 2020-12-11 NOTE — Telephone Encounter (Signed)
Patient returned call- she states she has appointment today- virtual with Dr Carolin Coy at 11:20. Patient is COVID +. Patient states she will talk to provider about her symptoms at the appointment today.

## 2020-12-11 NOTE — Telephone Encounter (Signed)
Noted  KP 

## 2020-12-18 ENCOUNTER — Ambulatory Visit: Payer: Medicaid Other | Admitting: Internal Medicine

## 2020-12-29 ENCOUNTER — Other Ambulatory Visit: Payer: Self-pay | Admitting: Internal Medicine

## 2020-12-29 DIAGNOSIS — I10 Essential (primary) hypertension: Secondary | ICD-10-CM

## 2020-12-29 DIAGNOSIS — J438 Other emphysema: Secondary | ICD-10-CM

## 2020-12-29 NOTE — Telephone Encounter (Signed)
Requested medications are due for refill today.  yes  Requested medications are on the active medications list.  yes  Last refill. Diltiazem 08/31/2020, Benazepril 12/04/2020  Future visit scheduled.   yes  Notes to clinic.  Labs are expired. Pt last seen for HTN 10/25/2019

## 2020-12-30 ENCOUNTER — Ambulatory Visit (INDEPENDENT_AMBULATORY_CARE_PROVIDER_SITE_OTHER): Payer: Medicaid Other | Admitting: Internal Medicine

## 2020-12-30 ENCOUNTER — Encounter: Payer: Self-pay | Admitting: Internal Medicine

## 2020-12-30 ENCOUNTER — Other Ambulatory Visit: Payer: Self-pay

## 2020-12-30 VITALS — BP 94/62 | HR 103 | Temp 98.2°F | Ht 66.0 in | Wt 84.0 lb

## 2020-12-30 DIAGNOSIS — Z23 Encounter for immunization: Secondary | ICD-10-CM

## 2020-12-30 DIAGNOSIS — Z1159 Encounter for screening for other viral diseases: Secondary | ICD-10-CM

## 2020-12-30 DIAGNOSIS — I700 Atherosclerosis of aorta: Secondary | ICD-10-CM | POA: Diagnosis not present

## 2020-12-30 DIAGNOSIS — I10 Essential (primary) hypertension: Secondary | ICD-10-CM | POA: Diagnosis not present

## 2020-12-30 DIAGNOSIS — J432 Centrilobular emphysema: Secondary | ICD-10-CM

## 2020-12-30 DIAGNOSIS — N3000 Acute cystitis without hematuria: Secondary | ICD-10-CM | POA: Diagnosis not present

## 2020-12-30 LAB — POCT URINALYSIS DIPSTICK
Bilirubin, UA: NEGATIVE
Blood, UA: NEGATIVE
Glucose, UA: NEGATIVE
Ketones, UA: NEGATIVE
Nitrite, UA: NEGATIVE
Protein, UA: NEGATIVE
Spec Grav, UA: 1.01 (ref 1.010–1.025)
Urobilinogen, UA: 0.2 E.U./dL
pH, UA: 6.5 (ref 5.0–8.0)

## 2020-12-30 NOTE — Progress Notes (Signed)
Date:  12/30/2020   Name:  Kendra Mullins   DOB:  09-02-1960   MRN:  [id number]   Chief Complaint: Hypertension, Urinary Tract Infection (No symptoms /), and Fatigue (Pt has no energy tired all the time )  Hypertension Pertinent negatives include no chest pain, headaches or shortness of breath. Past treatments include calcium channel blockers and ACE inhibitors. The current treatment provides significant improvement.  Hyperlipidemia This is a chronic problem. The problem is controlled. Pertinent negatives include no chest pain or shortness of breath. Current antihyperlipidemic treatment includes statins. The current treatment provides significant improvement of lipids.  Urinary Tract Infection  The problem has been resolved. The quality of the pain is described as burning. The patient is experiencing no pain. There has been no fever. Pertinent negatives include no chills, hematuria, nausea or vomiting. She has tried antibiotics (took 7 days of macrobid) for the symptoms.  COPD - she continues to smoke; using nicotine patches currently in an effort to quit.  Lab Results  Component Value Date   CREATININE 0.56 (L) 10/25/2019   BUN 9 10/25/2019   NA 137 10/25/2019   K 4.4 10/25/2019   CL 98 10/25/2019   CO2 23 10/25/2019   Lab Results  Component Value Date   CHOL 187 10/25/2019   HDL 129 10/25/2019   LDLCALC 49 10/25/2019   TRIG 40 10/25/2019   CHOLHDL 1.4 10/25/2019   Lab Results  Component Value Date   TSH 0.797 10/25/2019   No results found for: HGBA1C Lab Results  Component Value Date   WBC 7.5 10/25/2019   HGB 13.7 10/25/2019   HCT 40.2 10/25/2019   MCV 96 10/25/2019   PLT 319 10/25/2019   Lab Results  Component Value Date   ALT 22 10/25/2019   AST 25 10/25/2019   ALKPHOS 116 10/25/2019   BILITOT 0.5 10/25/2019     Review of Systems  Constitutional:  Positive for fatigue. Negative for chills, fever and unexpected weight change.  Respiratory:  Negative  for cough, chest tightness, shortness of breath and wheezing.   Cardiovascular:  Negative for chest pain and leg swelling.  Gastrointestinal:  Negative for nausea and vomiting.  Genitourinary:  Negative for dysuria and hematuria.  Neurological:  Negative for dizziness and headaches.   Patient Active Problem List   Diagnosis Date Noted  . Status post hysterectomy 05/19/2020  . NSCLC of left lung ([place]) 01/29/2020  . Closed fracture of head of left humerus 05/15/2019  . Aortic atherosclerosis ([place]) 12/31/2017  . TIA (transient ischemic attack) 06/25/2017  . Localized primary osteoarthritis of hands, bilateral 05/02/2017  . Continuous chronic alcoholism ([place]) 09/15/2014  . Anxiety and depression 09/15/2014  . Cough, persistent 09/15/2014  . Tobacco use disorder 09/15/2014  . Essential hypertension 09/15/2014  . Hot flash, menopausal 09/15/2014  . MI (mitral incompetence) 09/15/2014  . Centrilobular emphysema ([place]) 09/15/2014  . Low weight 09/15/2014    Allergies  Allergen Reactions  . Azithromycin Nausea Only  . Codeine Nausea And Vomiting    Past Surgical History:  Procedure Laterality Date  . ABDOMINAL HYSTERECTOMY  1986   one ovary remains    Social History   Tobacco Use  . Smoking status: Every Day    Packs/day: 0.50    Types: Cigarettes  . Smokeless tobacco: Never  . Tobacco comments:    using generic nicodern cq patches to try to quit  Vaping Use  . Vaping Use: Every day  Substance Use Topics  .  Alcohol use: Yes    Alcohol/week: 50.0 standard drinks    Types: 50 Standard drinks or equivalent per week  . Drug use: No     Medication list has been reviewed and updated.  Current Meds  Medication Sig  . ALPRAZolam (XANAX) 0.5 MG tablet Take 0.5 mg by mouth 2 (two) times daily as needed for anxiety.  . ASPIRIN LOW DOSE 81 MG EC tablet TAKE 1 TABLET BY MOUTH ONCE DAILY  . atorvastatin (LIPITOR) 40 MG tablet TAKE 1 TABLET(40 MG) BY MOUTH DAILY  . ATROVENT HFA  17 MCG/ACT inhaler INHALE 2 PUFFS BY MOUTH EVERY 6 HOURS AS NEEDED FOR WHEEZING  . benazepril (LOTENSIN) 5 MG tablet TAKE 1 TABLET(5 MG) BY MOUTH DAILY  . Blood Pressure Monitoring (BLOOD PRESSURE MONITOR AUTOMAT) DEVI 1 each by Does not apply route daily at 6 (six) AM. Use to check BP daily.  Marland Kitchen diltiazem (TIAZAC) 240 MG 24 hr capsule TAKE 1 CAPSULE(240 MG) BY MOUTH DAILY  . fluticasone (FLONASE) 50 MCG/ACT nasal spray SHAKE LIQUID AND USE 2 SPRAYS IN EACH NOSTRIL DAILY  . PROAIR HFA 108 (90 Base) MCG/ACT inhaler INHALE 2 PUFFS INTO THE LUNGS EVERY 6 HOURS AS NEEDED FOR WHEEZING OR SHORTNESS OF BREATH  . promethazine-dextromethorphan (PROMETHAZINE-DM) 6.25-15 MG/5ML syrup Take 5 mLs by mouth 4 (four) times daily as needed for cough.  . sertraline (ZOLOFT) 25 MG tablet Take 25 mg by mouth daily.    PHQ 2/9 Scores 12/11/2020 12/07/2020 09/09/2020 06/05/2020  PHQ - 2 Score 2 2 2 2   PHQ- 9 Score 4 4 4 3     GAD 7 : Generalized Anxiety Score 12/11/2020 12/07/2020 09/09/2020 06/05/2020  Nervous, Anxious, on Edge 0 0 1 1  Control/stop worrying 0 0 1 1  Worry too much - different things 0 0 1 1  Trouble relaxing 0 0 1 0  Restless 0 0 1 0  Easily annoyed or irritable 0 0 1 0  Afraid - awful might happen 0 0 0 0  Total GAD 7 Score 0 0 6 3  Anxiety Difficulty - - - -    BP Readings from Last 3 Encounters:  12/30/20 94/62  09/09/20 118/74  06/05/20 126/66    Physical Exam Vitals and nursing note reviewed.  Constitutional:      General: She is not in acute distress.    Appearance: She is well-developed.  HENT:     Head: Normocephalic and atraumatic.  Cardiovascular:     Rate and Rhythm: Normal rate and regular rhythm.     Pulses: Normal pulses.     Heart sounds: No murmur heard. Pulmonary:     Effort: Pulmonary effort is normal. No respiratory distress.     Breath sounds: No wheezing or rhonchi.  Musculoskeletal:     Cervical back: Normal range of motion and neck supple.     Right lower leg:  No edema.     Left lower leg: No edema.  Skin:    General: Skin is warm and dry.     Capillary Refill: Capillary refill takes less than 2 seconds.     Findings: No rash.  Neurological:     General: No focal deficit present.     Mental Status: She is alert and oriented to person, place, and time.  Psychiatric:        Mood and Affect: Mood normal.        Behavior: Behavior normal.    Wt Readings from Last 3 Encounters:  12/30/20 84 lb (38.1 kg)  09/09/20 77 lb (34.9 kg)  06/05/20 82 lb (37.2 kg)    BP 94/62   Pulse (!) 103   Temp 98.2 F (36.8 C) (Oral)   Ht 5\' 6"  (1.676 m)   Wt 84 lb (38.1 kg)   SpO2 96%   BMI 13.56 kg/m   Assessment and Plan: 1. Essential hypertension Clinically stable exam with well controlled BP. Tolerating medications without side effects at this time. Pt to continue current regimen and low sodium diet; benefits of regular exercise as able discussed. - CBC with Differential/Platelet - Comprehensive metabolic panel - TSH  2. Aortic atherosclerosis (HCC) On statin therapy without side effects Check labs and advise - Lipid panel  3. Centrilobular emphysema ([place]) She continues to smoke but is trying to cut back Her repeat CT chest showed almost complete resolution of the spiculated left upper lobe nodule  4. Acute cystitis without hematuria Treated with Macrobid x 7 days. Repeat UA is essentially clear - POCT urinalysis dipstick  5. Need for hepatitis C screening test - Hepatitis C antibody  6. Need for shingles vaccine Second dose today completes the series - Varicella-zoster vaccine IM   Partially dictated using Editor, commissioning. Any errors are unintentional.  Halina Maidens, MD [facility]  12/30/2020

## 2020-12-31 LAB — CBC WITH DIFFERENTIAL/PLATELET
Basophils Absolute: 0.1 10*3/uL (ref 0.0–0.2)
Basos: 1 %
EOS (ABSOLUTE): 0.2 10*3/uL (ref 0.0–0.4)
Eos: 2 %
Hematocrit: 38.7 % (ref 34.0–46.6)
Hemoglobin: 13.4 g/dL (ref 11.1–15.9)
Immature Grans (Abs): 0 10*3/uL (ref 0.0–0.1)
Immature Granulocytes: 0 %
Lymphocytes Absolute: 3 10*3/uL (ref 0.7–3.1)
Lymphs: 29 %
MCH: 32.4 pg (ref 26.6–33.0)
MCHC: 34.6 g/dL (ref 31.5–35.7)
MCV: 94 fL (ref 79–97)
Monocytes Absolute: 1.1 10*3/uL — ABNORMAL HIGH (ref 0.1–0.9)
Monocytes: 11 %
Neutrophils Absolute: 5.7 10*3/uL (ref 1.4–7.0)
Neutrophils: 57 %
Platelets: 374 10*3/uL (ref 150–450)
RBC: 4.14 x10E6/uL (ref 3.77–5.28)
RDW: 11.4 % — ABNORMAL LOW (ref 11.7–15.4)
WBC: 10.1 10*3/uL (ref 3.4–10.8)

## 2020-12-31 LAB — COMPREHENSIVE METABOLIC PANEL
ALT: 24 IU/L (ref 0–32)
AST: 30 IU/L (ref 0–40)
Albumin/Globulin Ratio: 1.8 (ref 1.2–2.2)
Albumin: 4.8 g/dL (ref 3.8–4.9)
Alkaline Phosphatase: 126 IU/L — ABNORMAL HIGH (ref 44–121)
BUN/Creatinine Ratio: 16 (ref 12–28)
BUN: 9 mg/dL (ref 8–27)
Bilirubin Total: 0.4 mg/dL (ref 0.0–1.2)
CO2: 22 mmol/L (ref 20–29)
Calcium: 9.7 mg/dL (ref 8.7–10.3)
Chloride: 99 mmol/L (ref 96–106)
Creatinine, Ser: 0.58 mg/dL (ref 0.57–1.00)
Globulin, Total: 2.6 g/dL (ref 1.5–4.5)
Glucose: 93 mg/dL (ref 65–99)
Potassium: 4.3 mmol/L (ref 3.5–5.2)
Sodium: 136 mmol/L (ref 134–144)
Total Protein: 7.4 g/dL (ref 6.0–8.5)
eGFR: 104 mL/min/{1.73_m2} (ref >59)

## 2020-12-31 LAB — LIPID PANEL
Chol/HDL Ratio: 1.7 ratio (ref 0.0–4.4)
Cholesterol, Total: 187 mg/dL (ref 100–199)
HDL: 110 mg/dL (ref >39)
LDL Chol Calc (NIH): 65 mg/dL (ref 0–99)
Triglycerides: 65 mg/dL (ref 0–149)
VLDL Cholesterol Cal: 12 mg/dL (ref 5–40)

## 2020-12-31 LAB — TSH: TSH: 2.12 u[IU]/mL (ref 0.450–4.500)

## 2020-12-31 LAB — HEPATITIS C ANTIBODY: Hep C Virus Ab: <0.1 s/co ratio (ref 0.0–0.9)

## 2021-01-01 ENCOUNTER — Telehealth: Payer: Self-pay | Admitting: Internal Medicine

## 2021-01-01 NOTE — Telephone Encounter (Signed)
Pt is calling back for lab results Cb- [phone]- [phone]

## 2021-01-01 NOTE — Telephone Encounter (Signed)
Pt called with lab resuly by CM.  KP

## 2021-01-09 DIAGNOSIS — Z419 Encounter for procedure for purposes other than remedying health state, unspecified: Secondary | ICD-10-CM | POA: Diagnosis not present

## 2021-01-30 ENCOUNTER — Other Ambulatory Visit: Payer: Self-pay | Admitting: Internal Medicine

## 2021-01-30 DIAGNOSIS — I10 Essential (primary) hypertension: Secondary | ICD-10-CM

## 2021-01-30 NOTE — Telephone Encounter (Signed)
Requested Prescriptions  Pending Prescriptions Disp Refills  . fluticasone (FLONASE) 50 MCG/ACT nasal spray [Pharmacy Med Name: FLUTICASONE 50MCG NASAL SP (120) RX] 48 g 0    Sig: SHAKE LIQUID AND USE 2 SPRAYS IN EACH NOSTRIL DAILY     Ear, Nose, and Throat: Nasal Preparations - Corticosteroids Passed - 01/30/2021  3:38 AM      Passed - Valid encounter within last 12 months    Recent Outpatient Visits          1 month ago Essential hypertension   [facility] Glean Hess, [facility]   1 month ago COVID-19 virus infection   [facility] Glean Hess, [facility]   4 months ago UTI symptoms   [facility] Glean Hess, [facility]   7 months ago Acute cystitis without hematuria   [facility] Glean Hess, MD   1 year ago Essential hypertension   [facility] Glean Hess, MD             . benazepril (LOTENSIN) 5 MG tablet [Pharmacy Med Name: BENAZEPRIL 5MG  TABLETS] 90 tablet 0    Sig: TAKE 1 TABLET(5 MG) BY MOUTH DAILY     Cardiovascular:  ACE Inhibitors Passed - 01/30/2021  3:38 AM      Passed - Cr in normal range and within 180 days    Creatinine, Ser  Date Value Ref Range Status  12/30/2020 0.58 0.57 - 1.00 mg/dL Final         Passed - K in normal range and within 180 days    Potassium  Date Value Ref Range Status  12/30/2020 4.3 3.5 - 5.2 mmol/L Final         Passed - Patient is not pregnant      Passed - Last BP in normal range    BP Readings from Last 1 Encounters:  12/30/20 94/62         Passed - Valid encounter within last 6 months    Recent Outpatient Visits          1 month ago Essential hypertension   [facility] Glean Hess, MD   1 month ago COVID-19 virus infection   [facility] Glean Hess, [facility]   4 months ago UTI symptoms   [facility] Glean Hess, [facility]   7 months ago Acute cystitis without hematuria   [facility] Glean Hess, MD   1 year  ago Essential hypertension   [facility] Glean Hess, MD

## 2021-02-03 ENCOUNTER — Other Ambulatory Visit: Payer: Self-pay | Admitting: Internal Medicine

## 2021-02-03 DIAGNOSIS — J438 Other emphysema: Secondary | ICD-10-CM

## 2021-02-04 ENCOUNTER — Other Ambulatory Visit: Payer: Self-pay | Admitting: Internal Medicine

## 2021-02-04 DIAGNOSIS — J438 Other emphysema: Secondary | ICD-10-CM

## 2021-02-04 NOTE — Telephone Encounter (Signed)
Requested Prescriptions  Pending Prescriptions Disp Refills  . ATROVENT HFA 17 MCG/ACT inhaler [Pharmacy Med Name: ATROVENT HFA ORAL INHALER (200 INH)] 12.9 g 4    Sig: INHALE 2 PUFFS BY MOUTH Q6H AS NEEDED FOR WHEEZING     Pulmonology:  Anticholinergic Agents Passed - 02/03/2021  9:14 PM      Passed - Valid encounter within last 12 months    Recent Outpatient Visits          1 month ago Essential hypertension   [facility] Glean Hess, MD   1 month ago COVID-19 virus infection   [facility] Glean Hess, MD   4 months ago UTI symptoms   [facility] Glean Hess, MD   8 months ago Acute cystitis without hematuria   [facility] Glean Hess, MD   1 year ago Essential hypertension   [facility] Glean Hess, MD

## 2021-02-05 ENCOUNTER — Ambulatory Visit: Payer: Medicaid Other

## 2021-02-05 NOTE — Telephone Encounter (Signed)
DUPLICATE  Requested Prescriptions  Pending Prescriptions Disp Refills  . PROAIR HFA 108 (90 Base) MCG/ACT inhaler [Pharmacy Med Name: PROAIR HFA ORAL INH (200  PFS) 8.5G] 8.5 g 0    Sig: INHALE 2 PUFFS INTO THE LUNGS EVERY 6 HOURS AS NEEDED FOR WHEEZING OR SHORTNESS OF BREATH     Pulmonology:  Beta Agonists Failed - 02/04/2021  2:50 PM      Failed - One inhaler should last at least one month. If the patient is requesting refills earlier, contact the patient to check for uncontrolled symptoms.      Passed - Valid encounter within last 12 months    Recent Outpatient Visits          1 month ago Essential hypertension   [facility] Glean Hess, MD   2 months ago COVID-19 virus infection   [facility] Glean Hess, MD   4 months ago UTI symptoms   [facility] Glean Hess, MD   8 months ago Acute cystitis without hematuria   [facility] Glean [facility], [facility]   1 year ago Essential hypertension   [facility] Glean Hess, [facility]             Signed Prescriptions Disp Refills   PROAIR HFA 108 (90 Base) MCG/ACT inhaler 12.9 g 4    Sig: INHALE 2 PUFFS INTO THE LUNGS EVERY 6 HOURS AS NEEDED FOR WHEEZING OR SHORTNESS OF BREATH     Pulmonology:  Beta Agonists Failed - 02/04/2021  2:50 PM      Failed - One inhaler should last at least one month. If the patient is requesting refills earlier, contact the patient to check for uncontrolled symptoms.      Passed - Valid encounter within last 12 months    Recent Outpatient Visits          1 month ago Essential hypertension   [facility] Glean Hess, MD   2 months ago COVID-19 virus infection   [facility] Glean Hess, MD   4 months ago UTI symptoms   [facility] Glean Hess, [facility]   8 months ago Acute cystitis without hematuria   [facility] Glean Hess, MD   1 year ago Essential hypertension   [facility] Glean Hess, MD

## 2021-02-05 NOTE — Telephone Encounter (Signed)
Requested Prescriptions  Pending Prescriptions Disp Refills  . PROAIR HFA 108 (90 Base) MCG/ACT inhaler [Pharmacy Med Name: PROAIR HFA ORAL INH (200  PFS) 8.5G] 12.9 g 4    Sig: INHALE 2 PUFFS INTO THE LUNGS EVERY 6 HOURS AS NEEDED FOR WHEEZING OR SHORTNESS OF BREATH     Pulmonology:  Beta Agonists Failed - 02/04/2021  2:50 PM      Failed - One inhaler should last at least one month. If the patient is requesting refills earlier, contact the patient to check for uncontrolled symptoms.      Passed - Valid encounter within last 12 months    Recent Outpatient Visits          1 month ago Essential hypertension   [facility] Glean Hess, MD   2 months ago COVID-19 virus infection   [facility] Glean Hess, MD   4 months ago UTI symptoms   [facility] Glean Hess, MD   8 months ago Acute cystitis without hematuria   [facility] Glean Hess, MD   1 year ago Essential hypertension   [facility] Glean Hess, MD             . [facility] HFA 108 [phone] Base) MCG/ACT inhaler [Pharmacy Med Name: PROAIR HFA ORAL INH (200  PFS) 8.5G] 8.5 g 0    Sig: INHALE 2 PUFFS INTO THE LUNGS EVERY 6 HOURS AS NEEDED FOR WHEEZING OR SHORTNESS OF BREATH     Pulmonology:  Beta Agonists Failed - 02/04/2021  2:50 PM      Failed - One inhaler should last at least one month. If the patient is requesting refills earlier, contact the patient to check for uncontrolled symptoms.      Passed - Valid encounter within last 12 months    Recent Outpatient Visits          1 month ago Essential hypertension   [facility] Glean Hess, MD   2 months ago COVID-19 virus infection   [facility] Glean Hess, [facility]   4 months ago UTI symptoms   [facility] Glean Hess, MD   8 months ago Acute cystitis without hematuria   [facility] Glean Hess, MD   1 year ago Essential hypertension   [facility] Glean Hess, MD

## 2021-02-09 DIAGNOSIS — Z419 Encounter for procedure for purposes other than remedying health state, unspecified: Secondary | ICD-10-CM | POA: Diagnosis not present

## 2021-02-11 DIAGNOSIS — F331 Major depressive disorder, recurrent, moderate: Secondary | ICD-10-CM | POA: Diagnosis not present

## 2021-02-18 ENCOUNTER — Telehealth: Payer: Self-pay | Admitting: Internal Medicine

## 2021-02-18 ENCOUNTER — Ambulatory Visit: Payer: Self-pay | Admitting: *Deleted

## 2021-02-18 NOTE — Telephone Encounter (Signed)
Urinary frequency, dysuria and urgency began this morning. Amount is little more than a dribble with each time to the bathroom. Urine looks cloudy but no blood noted. No fever/discharge/odor. Drinking water up to two bottles today. Care including OTC Azo, Tylenol for discomfort.  No availability open tomorrow for [facility] to schedule.  Routing to the office for any option for tomorrow. Patient does not do online visits but is open to telephone visit with dropping off urine sample.  Pharmacy on file.   Reason for Disposition  Urinating more frequently than usual (i.e., frequency)  Answer Assessment - Initial Assessment Questions 1. SYMPTOM: "What's the main symptom you're concerned about?" (e.g., frequency, incontinence)     Frequency and urgency 2. ONSET: "When did this  start?"     This am 3. PAIN: "Is there any pain?" If Yes, ask: "How bad is it?" (Scale: 1-10; mild, moderate, severe)     Yes, very uncomfortable 4. CAUSE: "What do you think is causing the symptoms?"     5. OTHER SYMPTOMS: "Do you have any other symptoms?" (e.g., fever, flank pain, blood in urine, pain with urination)     Pain with urination 6. PREGNANCY: "Is there any chance you are pregnant?" "When was your last menstrual period?"     na  Protocols used: Urinary Symptoms-A-AH

## 2021-02-18 NOTE — Telephone Encounter (Signed)
[facility] called and stated that they were unable to schedule a patient to be seen online, and had a patient with cloudy urine, and urination frequency. Got advice from Brittney to schedule pt for 3:20 slot which was new patient only, but advise pt to be here at 3:00 to get urine sample.

## 2021-02-19 ENCOUNTER — Telehealth: Payer: Self-pay | Admitting: Internal Medicine

## 2021-02-19 ENCOUNTER — Ambulatory Visit: Payer: Medicaid Other | Admitting: Internal Medicine

## 2021-02-19 ENCOUNTER — Other Ambulatory Visit: Payer: Self-pay

## 2021-02-19 ENCOUNTER — Ambulatory Visit: Payer: Self-pay | Admitting: *Deleted

## 2021-02-19 DIAGNOSIS — N390 Urinary tract infection, site not specified: Secondary | ICD-10-CM

## 2021-02-19 NOTE — Telephone Encounter (Signed)
Patient called in to say she now has fever of 101,chills, and body ache. Patient wanted to be a virtual visit, but instructed to take a covid test first. Patient stated that she doesn't have a covid test at home. Patient was then instructed to go to urgent care for symptoms. Patient stated that she would get her husband to drive her there.

## 2021-02-19 NOTE — Telephone Encounter (Signed)
ADDENDUM: Patient stated that she would like to be referred to a UROLOGIST for the frequent UTI's she is having.

## 2021-02-19 NOTE — Telephone Encounter (Signed)
Referral placed.

## 2021-02-19 NOTE — Telephone Encounter (Signed)
Pt calling in.  She has an in office appt with Dr. Army Melia at 3:20 today for UTI symptoms however she is now having URI symptoms, fever, coughing, body aches and chills, runny nose and feels terrible.   She feels too bad to drive in to the office.   She is requesting a virtual actually a phone call because she does not do the computer, in place of her in office visit today.   She doesn't have a way into the office and doesn't feel like driving in because she feels so bad.   She is in bed now as we were talking.  I called into [facility] and they requested I send in my notes and they will check with Dr. Army Melia and see if she will make an exception and see her virtually.   They only do virtual visits if positive for Covid.   Pt does not have a Covid test at home to test herself.   She can come to the office and be tested for Covid and flu however she said she doesn't feel she can drive in because she feels so bad.  I sent my notes to [facility] for Dr Army Melia.

## 2021-02-19 NOTE — Telephone Encounter (Signed)
Reason for Disposition  Urinating more frequently than usual (i.e., frequency)  Answer Assessment - Initial Assessment Questions 1. SYMPTOM: "What's the main symptom you're concerned about?" (e.g., frequency, incontinence)     Pt calling in and is so sick I can't drive myself in.   I think I have the flu on top of the GI bug and UTI.    I'm having fever and have a sore throat.   It 100.6 now.   Last night it was 101.   I've been in bed all day yesterday.   I'm going to the bathroom frequently. 2. ONSET: "When did the  burning, frequency and incontinence  start?"     Yesterday morning.   Wed. I was fine. 3. PAIN: "Is there any pain?" If Yes, ask: "How bad is it?" (Scale: 1-10; mild, moderate, severe)     Burning, frequency, cloudy looking. 4. CAUSE: "What do you think is causing the symptoms?"     UTI I get these.   I have a virtual visit set up for today 2:30.    I've several UTIs this past year.    I want a referral to a urologist.  5. OTHER SYMPTOMS: "Do you have any other symptoms?" (e.g., fever, flank pain, blood in urine, pain with urination)     I have fever, sore throat, runny nose, I really congested in my head.   I feel like crap.   I'm coughing but it's not in my chest, a dry cough nothing coming up.    I'm having body aches and chills.   My legs are aching too.   My lower back across both sides is hurting too.  No diarrhea or vomiting.  I'm having nausea.   Poor appetite.  I'm having BMs normally. 6. PREGNANCY: "Is there any chance you are pregnant?" "When was your last menstrual period?"     N/A  Protocols used: Urinary Symptoms-A-AH

## 2021-02-24 IMAGING — CR DG KNEE COMPLETE 4+V*L*
4 series · 4 of 4 positions shown · non-contrast
Comparison: None.

CLINICAL DATA: left knee pain - trauma

EXAM:
LEFT KNEE - COMPLETE 4+ VIEW

[knee ap]
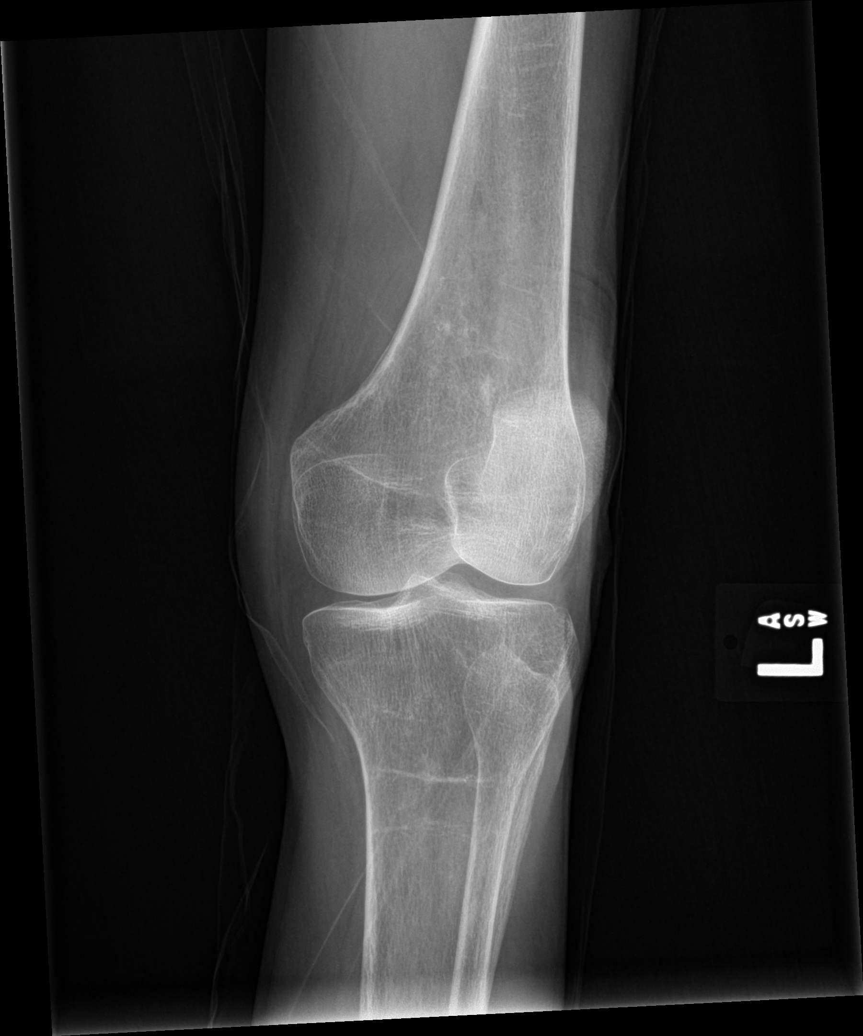

[knee lat]
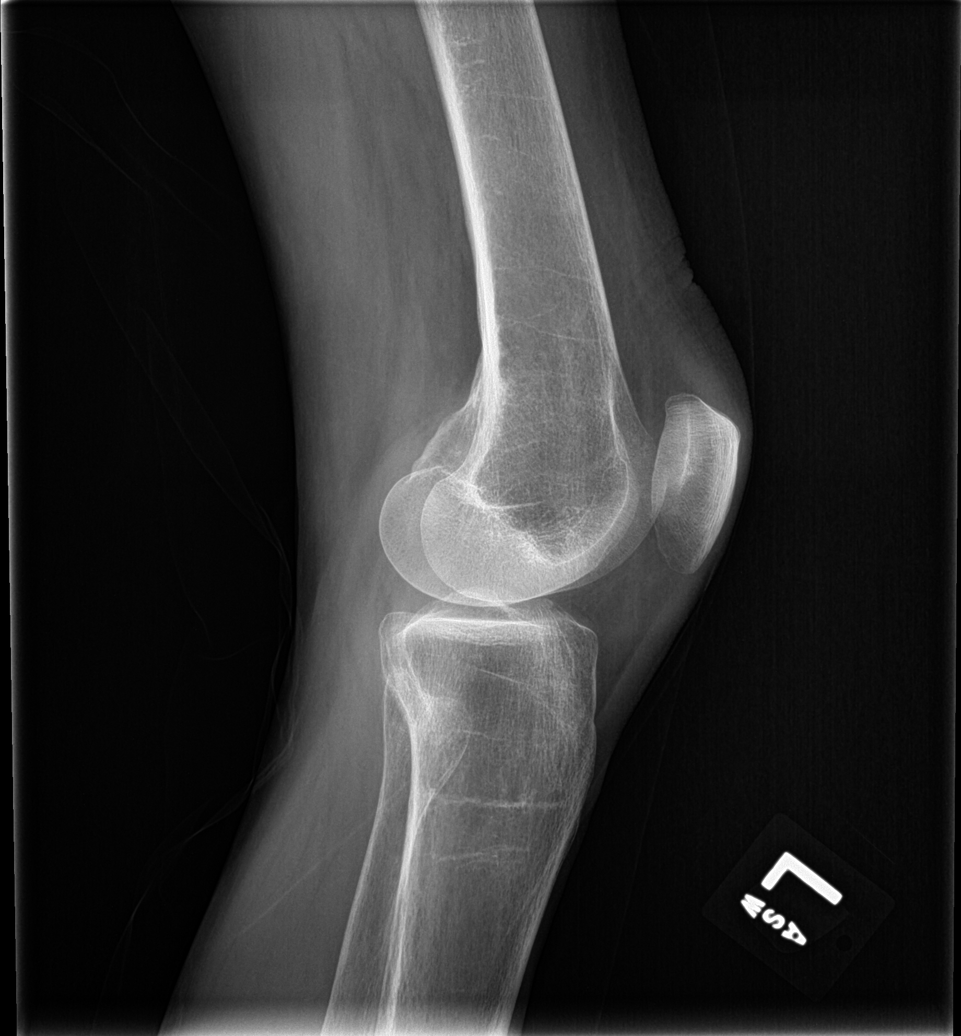

[knee obl (1 of 2)]
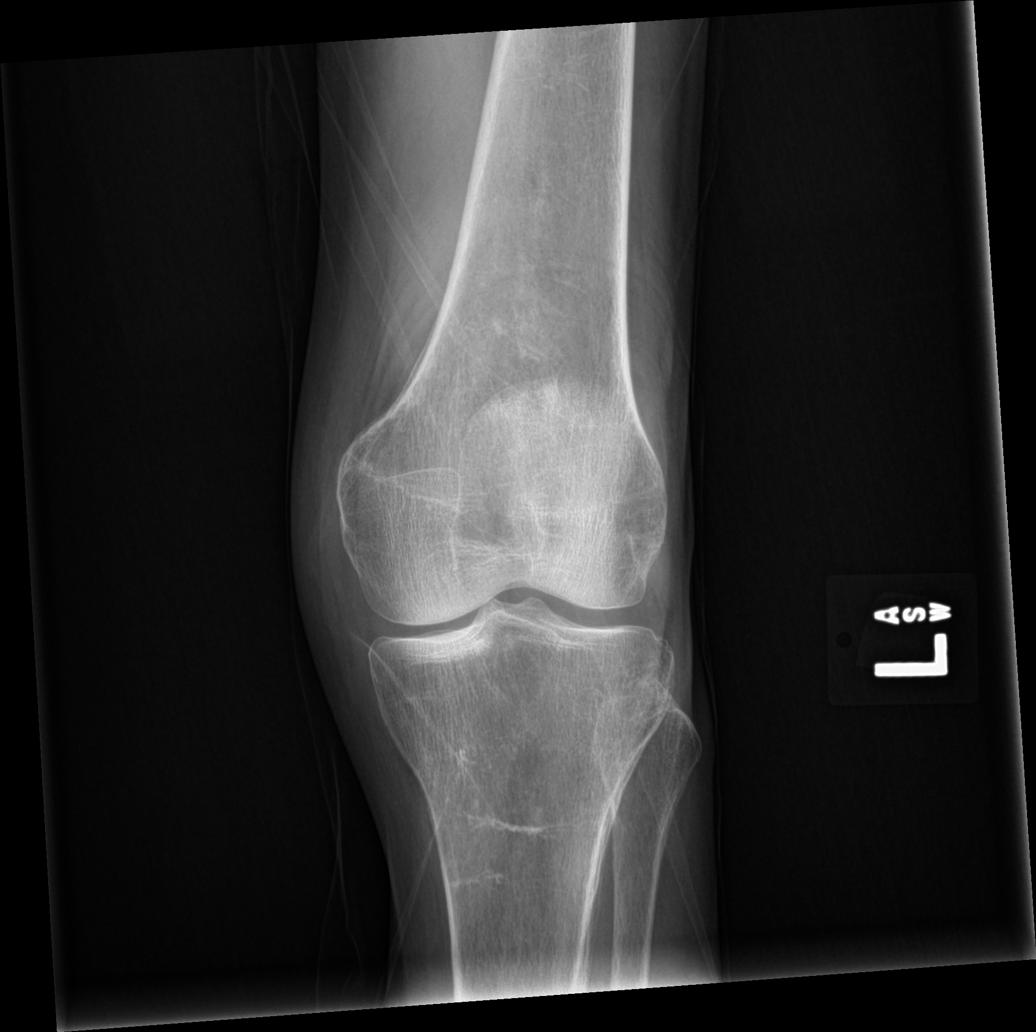

[knee obl (2 of 2)]
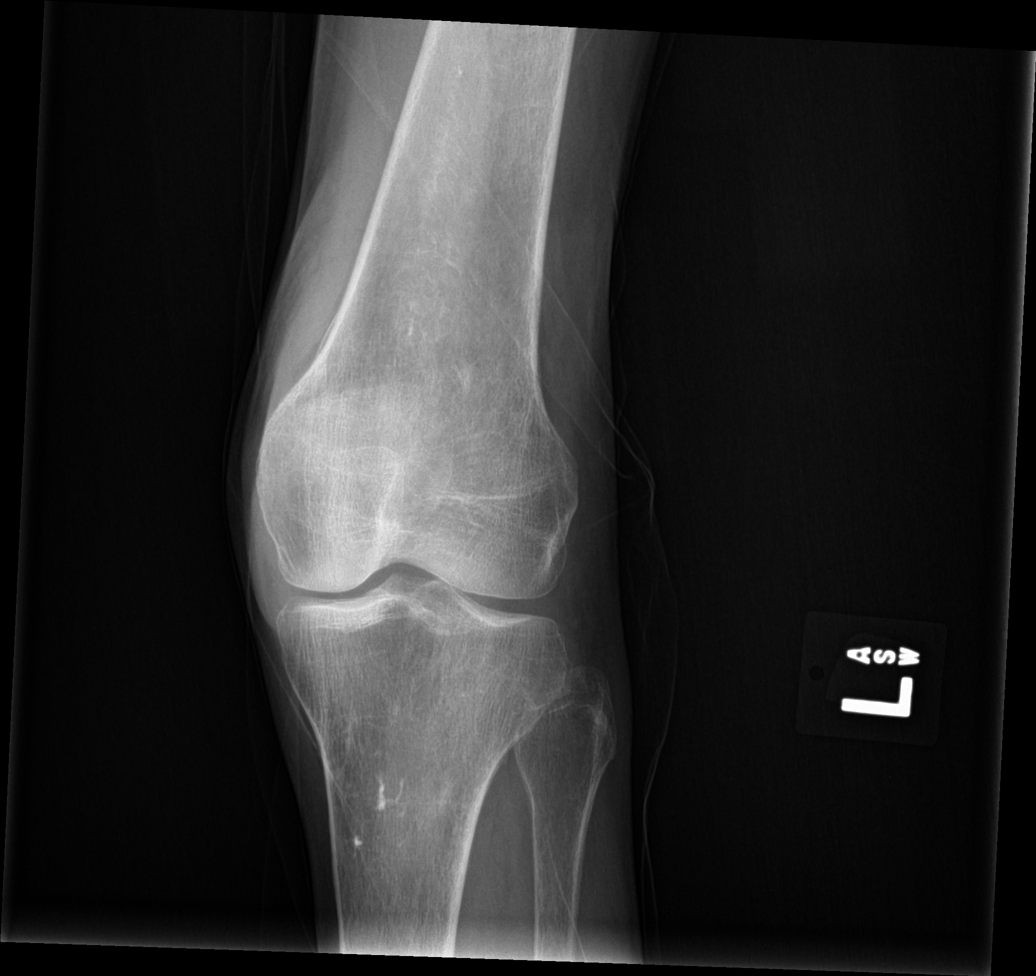

[4 of 4 positions shown; findings below may reference images not displayed]

FINDINGS: Physiologic alignment. Mild medial compartment joint space loss and
subchondral sclerosis. No fracture or focal osseous lesion. Soft
tissues are within normal limits.
IMPRESSION: No acute osseous abnormality.

Mild medial compartment degenerative changes.

## 2021-03-02 ENCOUNTER — Ambulatory Visit (INDEPENDENT_AMBULATORY_CARE_PROVIDER_SITE_OTHER): Payer: Medicaid Other | Admitting: Urology

## 2021-03-02 ENCOUNTER — Other Ambulatory Visit
Admission: RE | Admit: 2021-03-02 | Discharge: 2021-03-02 | Disposition: A | Discharge status: Home / Self Care | Payer: Medicaid Other | Attending: Urology | Admitting: Urology

## 2021-03-02 ENCOUNTER — Encounter: Payer: Self-pay | Admitting: Urology

## 2021-03-02 ENCOUNTER — Other Ambulatory Visit: Payer: Self-pay

## 2021-03-02 VITALS — BP 122/56 | HR 91 | Ht 65.0 in | Wt 86.0 lb

## 2021-03-02 DIAGNOSIS — N3000 Acute cystitis without hematuria: Secondary | ICD-10-CM | POA: Diagnosis not present

## 2021-03-02 DIAGNOSIS — N390 Urinary tract infection, site not specified: Secondary | ICD-10-CM

## 2021-03-02 LAB — URINALYSIS, COMPLETE (UACMP) WITH MICROSCOPIC
Bilirubin Urine: NEGATIVE
Glucose, UA: NEGATIVE mg/dL
Ketones, ur: NEGATIVE mg/dL
Nitrite: NEGATIVE
Protein, ur: NEGATIVE mg/dL
Specific Gravity, Urine: 1.015 (ref 1.005–1.030)
WBC, UA: >50 WBC/hpf (ref 0–5)
pH: 6.5 (ref 5.0–8.0)

## 2021-03-02 MED ORDER — CEPHALEXIN 500 MG PO CAPS: 500.0000 mg | ORAL_CAPSULE | Freq: Two times a day (BID) | ORAL | 0 refills | Status: DC

## 2021-03-02 MED ORDER — NITROFURANTOIN MACROCRYSTAL 50 MG PO CAPS: 50.0000 mg | ORAL_CAPSULE | Freq: Every day | ORAL | 0 refills | Status: DC

## 2021-03-02 NOTE — Progress Notes (Signed)
   03/02/21 2:26 PM   Caryn Bee Ward 07-22-1960 [id number]  CC: Dysuria, urgency/frequency, recurrent UTIs  HPI: 60 year old female who reports 2 to 3 weeks of urinary symptoms with urgency/frequency, pelvic pain, and dysuria worrisome for UTI.  She has not had a urinalysis checked.  She reports she is had 4-5 UTIs over the last year, but none of those records or cultures are available to me.  Typical UTI symptoms for her pelvic pain, dysuria, and urgency/frequency.  She denies any gross hematuria.  Urinalysis today grossly concerning for UTI with 0-5 squamous cells, 0-5 RBCs, greater than 50 WBCs, many bacteria, moderate leukocytes.  Will send for culture and atypicals.   PMH: Past Medical History:  Diagnosis Date   Ankle inflammation 09/15/2014   COPD (chronic obstructive pulmonary disease) (HCC)    Hypertension    PTSD (post-traumatic stress disorder)     Surgical History: Past Surgical History:  Procedure Laterality Date   ABDOMINAL HYSTERECTOMY  1986   one ovary remains    Family History: Family History  Problem Relation Age of Onset   Cancer Mother        breast    Social History:  reports that she has been smoking cigarettes. She has been smoking an average of .5 packs per day. She has never used smokeless tobacco. She reports current alcohol use of about 50.0 standard drinks per week. She reports that she does not use drugs.  Physical Exam: BP (!) 122/56 (BP Location: Left Arm, Patient Position: Sitting, Cuff Size: Normal)   Pulse 91   Ht 5\' 5"  (1.651 m)   Wt 86 lb (39 kg)   BMI 14.31 kg/m    Constitutional:  Alert and oriented, No acute distress. Cardiovascular: No clubbing, cyanosis, or edema. Respiratory: Normal respiratory effort, no increased work of breathing. GI: Abdomen is soft, nontender, nondistended, no abdominal masses  Laboratory Data: Reviewed  Assessment & Plan:   60 year old female with reported history of 4-5 UTIs in the last year, and  urinalysis grossly concerning for infection today.  Will send for culture.  We discussed the evaluation and treatment of patients with recurrent UTIs at length.  We specifically discussed the differences between asymptomatic bacteriuria and true urinary tract infection.  We discussed the AUA definition of recurrent UTI of at least 2 culture proven symptomatic acute cystitis episodes in a 3-month period, or 3 within a 1 year period.  We discussed the importance of culture directed antibiotic treatment, and antibiotic stewardship.  First-line therapy includes nitrofurantoin(5 days), Bactrim(3 days), or fosfomycin(3 g single dose).  Possible etiologies of recurrent infection include periurethral tissue atrophy in postmenopausal woman, constipation, sexual activity, incomplete emptying, anatomic abnormalities, and even genetic predisposition.  Finally, we discussed the role of perineal hygiene, timed voiding, adequate hydration, topical vaginal estrogen, cranberry prophylaxis, and low-dose antibiotic prophylaxis.  Keflex 500 mg twice daily x5 days for acute UTI followed by nitrofurantoin 50 mg prophylaxis x90 days, follow-up culture results.  I also offered her topical estrogen cream but she would like to hold off at this time and start with cranberry tablets alone.  RTC 4 months symptom check   Nickolas Madrid, MD 03/02/2021  [facility] [address], [place], [place] [address] [phone]

## 2021-03-02 NOTE — Patient Instructions (Signed)

## 2021-03-05 LAB — URINE CULTURE: Culture: 60000 — AB

## 2021-03-11 DIAGNOSIS — Z419 Encounter for procedure for purposes other than remedying health state, unspecified: Secondary | ICD-10-CM | POA: Diagnosis not present

## 2021-03-11 LAB — MISC LABCORP TEST (SEND OUT): Labcorp test code: 86884

## 2021-04-01 ENCOUNTER — Other Ambulatory Visit: Payer: Self-pay | Admitting: Internal Medicine

## 2021-04-01 DIAGNOSIS — I10 Essential (primary) hypertension: Secondary | ICD-10-CM

## 2021-04-01 NOTE — Telephone Encounter (Signed)
Requested Prescriptions  Pending Prescriptions Disp Refills   diltiazem (TIAZAC) 240 MG 24 hr capsule [Pharmacy Med Name: DILTIAZEM ER 240MG  CAPSULES (24 HR)] 30 capsule 0    Sig: TAKE 1 CAPSULE(240 MG) BY MOUTH DAILY     Cardiovascular:  Calcium Channel Blockers Passed - 04/01/2021  9:41 AM      Passed - Last BP in normal range    BP Readings from Last 1 Encounters:  03/02/21 (!) 122/56         Passed - Valid encounter within last 6 months    Recent Outpatient Visits          3 months ago Essential hypertension   [facility] Glean Hess, MD   3 months ago COVID-19 virus infection   [facility] Glean Hess, MD   6 months ago UTI symptoms   [place], MD   10 months ago Acute cystitis without hematuria   [facility] Glean Hess, MD   1 year ago Essential hypertension   [facility] Glean Hess, MD

## 2021-04-11 DIAGNOSIS — Z419 Encounter for procedure for purposes other than remedying health state, unspecified: Secondary | ICD-10-CM | POA: Diagnosis not present

## 2021-04-29 ENCOUNTER — Other Ambulatory Visit: Payer: Self-pay | Admitting: Internal Medicine

## 2021-04-29 DIAGNOSIS — I10 Essential (primary) hypertension: Secondary | ICD-10-CM

## 2021-04-29 NOTE — Telephone Encounter (Signed)
Requested medication (s) are due for refill today:   Yes  Requested medication (s) are on the active medication list:   Yes  Future visit scheduled:   Yes 05/04/2021   Last ordered: 04/01/2021 #30, 0 refills  Returned for provider to review for refills prior to appt.  Courtesy refill given in Dec.    He has cancelled his last 2 appts.     Requested Prescriptions  Pending Prescriptions Disp Refills   diltiazem (TIAZAC) 240 MG 24 hr capsule [Pharmacy Med Name: DILTIAZEM ER 240MG  CAPSULES (24 HR)] 30 capsule 0    Sig: TAKE 1 CAPSULE(240 MG) BY MOUTH DAILY     Cardiovascular:  Calcium Channel Blockers Passed - 04/29/2021  2:16 PM      Passed - Last BP in normal range    BP Readings from Last 1 Encounters:  03/02/21 (!) 122/56          Passed - Valid encounter within last 6 months    Recent Outpatient Visits           4 months ago Essential hypertension   [facility] Glean Hess, MD   4 months ago COVID-19 virus infection   [facility] Glean Hess, MD   7 months ago UTI symptoms   [place], MD   10 months ago Acute cystitis without hematuria   [facility] Glean Hess, MD   1 year ago Essential hypertension   [place], Laura H, MD       Future Appointments             In 5 days Glean Hess, MD [facility], [facility]

## 2021-04-29 NOTE — Telephone Encounter (Signed)
Requested Prescriptions  Pending Prescriptions Disp Refills   fluticasone (FLONASE) 50 MCG/ACT nasal spray [Pharmacy Med Name: FLUTICASONE 50MCG NASAL SP (120) RX] 48 g 0    Sig: SHAKE LIQUID AND USE 2 SPRAYS IN EACH NOSTRIL DAILY     Ear, Nose, and Throat: Nasal Preparations - Corticosteroids Passed - 04/29/2021  3:34 AM      Passed - Valid encounter within last 12 months    Recent Outpatient Visits          4 months ago Essential hypertension   [facility] Glean Hess, MD   4 months ago COVID-19 virus infection   [facility] Glean Hess, MD   7 months ago UTI symptoms   [place], MD   10 months ago Acute cystitis without hematuria   [facility] Glean Hess, MD   1 year ago Essential hypertension   [place], MD      Future Appointments            In 5 days Glean Hess, MD [facility], [facility]

## 2021-05-04 ENCOUNTER — Other Ambulatory Visit: Payer: Self-pay | Admitting: Internal Medicine

## 2021-05-04 ENCOUNTER — Ambulatory Visit (INDEPENDENT_AMBULATORY_CARE_PROVIDER_SITE_OTHER): Payer: Medicaid Other | Admitting: Internal Medicine

## 2021-05-04 ENCOUNTER — Encounter: Payer: Self-pay | Admitting: Internal Medicine

## 2021-05-04 ENCOUNTER — Other Ambulatory Visit: Payer: Self-pay

## 2021-05-04 VITALS — BP 124/70 | HR 120 | Ht 65.0 in | Wt 83.0 lb

## 2021-05-04 DIAGNOSIS — I10 Essential (primary) hypertension: Secondary | ICD-10-CM

## 2021-05-04 DIAGNOSIS — N342 Other urethritis: Secondary | ICD-10-CM | POA: Diagnosis not present

## 2021-05-04 DIAGNOSIS — R5383 Other fatigue: Secondary | ICD-10-CM

## 2021-05-04 DIAGNOSIS — R634 Abnormal weight loss: Secondary | ICD-10-CM

## 2021-05-04 MED ORDER — ESTROGENS CONJUGATED 0.625 MG/GM VA CREA: TOPICAL_CREAM | VAGINAL | 1 refills | Status: DC

## 2021-05-04 NOTE — Progress Notes (Signed)
Date:  05/04/2021   Name:  Kendra Mullins   DOB:  October 10, 1960   MRN:  [id number]   Chief Complaint: Urinary Tract Infection (Seen Urology in November 2022. Was put on 2 abx. Got better. Then as soon as she stopped abx she is having a lot of frequency and aching in the beginning of urinating. )  Urinary Tract Infection  This is a recurrent problem. The current episode started 1 to 4 weeks ago. The problem occurs every urination. The problem has been unchanged. The quality of the pain is described as burning. The pain is mild. There has been no fever. Associated symptoms include frequency. Pertinent negatives include no chills, flank pain, hematuria, hesitancy, urgency or vomiting.   Lab Results  Component Value Date   NA 136 12/30/2020   K 4.3 12/30/2020   CO2 22 12/30/2020   GLUCOSE 93 12/30/2020   BUN 9 12/30/2020   CREATININE 0.58 12/30/2020   CALCIUM 9.7 12/30/2020   EGFR 104 12/30/2020   GFRNONAA 102 10/25/2019   Lab Results  Component Value Date   CHOL 187 12/30/2020   HDL 110 12/30/2020   LDLCALC 65 12/30/2020   TRIG 65 12/30/2020   CHOLHDL 1.7 12/30/2020   Lab Results  Component Value Date   TSH 2.120 12/30/2020   No results found for: HGBA1C Lab Results  Component Value Date   WBC 10.1 12/30/2020   HGB 13.4 12/30/2020   HCT 38.7 12/30/2020   MCV 94 12/30/2020   PLT 374 12/30/2020   Lab Results  Component Value Date   ALT 24 12/30/2020   AST 30 12/30/2020   ALKPHOS 126 (H) 12/30/2020   BILITOT 0.4 12/30/2020   No results found for: 25OHVITD2, 25OHVITD3, VD25OH   Review of Systems  Constitutional:  Positive for fatigue. Negative for chills and fever.  Respiratory:  Negative for chest tightness and shortness of breath.   Cardiovascular:  Negative for chest pain and leg swelling.  Gastrointestinal:  Negative for vomiting.  Genitourinary:  Positive for frequency. Negative for decreased urine volume, flank pain, hematuria, hesitancy, urgency and vaginal  pain.  Psychiatric/Behavioral:  Negative for dysphoric mood and sleep disturbance. The patient is nervous/anxious.    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[place]) 01/29/2020   Closed fracture of head of left humerus 05/15/2019   Aortic atherosclerosis ([place]) 12/31/2017   TIA (transient ischemic attack) 06/25/2017   Localized primary osteoarthritis of hands, bilateral 05/02/2017   Continuous chronic alcoholism ([place]) 09/15/2014   Anxiety and depression 09/15/2014   Cough, persistent 09/15/2014   Tobacco use disorder 09/15/2014   Essential hypertension 09/15/2014   Hot flash, menopausal 09/15/2014   MI (mitral incompetence) 09/15/2014   Centrilobular emphysema ([place]) 09/15/2014   Low weight 09/15/2014    Allergies  Allergen Reactions   Azithromycin Nausea Only   Codeine Nausea And Vomiting    Past Surgical History:  Procedure Laterality Date   ABDOMINAL HYSTERECTOMY  1986   one ovary remains    Social History   Tobacco Use   Smoking status: Every Day    Packs/day: 0.50    Types: Cigarettes   Smokeless tobacco: Never   Tobacco comments:    using generic nicodern cq patches to try to quit  Vaping Use   Vaping Use: Every day  Substance Use Topics   Alcohol use: Yes    Alcohol/week: 50.0 standard drinks  Types: 50 Standard drinks or equivalent per week   Drug use: No     Medication list has been reviewed and updated.  Current Meds  Medication Sig   ALPRAZolam (XANAX) 0.5 MG tablet Take 0.5 mg by mouth 2 (two) times daily as needed for anxiety.   ASPIRIN LOW DOSE 81 MG EC tablet TAKE 1 TABLET BY MOUTH ONCE DAILY   atorvastatin (LIPITOR) 40 MG tablet TAKE 1 TABLET(40 MG) BY MOUTH DAILY   ATROVENT HFA 17 MCG/ACT inhaler INHALE 2 PUFFS BY MOUTH Q6H AS NEEDED FOR WHEEZING   benazepril (LOTENSIN) 5 MG tablet TAKE 1 TABLET(5 MG) BY MOUTH DAILY   Blood Pressure Monitoring (BLOOD PRESSURE MONITOR  AUTOMAT) DEVI 1 each by Does not apply route daily at 6 (six) AM. Use to check BP daily.   diltiazem (TIAZAC) 240 MG 24 hr capsule TAKE 1 CAPSULE(240 MG) BY MOUTH DAILY   fluticasone (FLONASE) 50 MCG/ACT nasal spray SHAKE LIQUID AND USE 2 SPRAYS IN EACH NOSTRIL DAILY   nicotine (NICODERM CQ - DOSED IN MG/24 HOURS) 21 mg/24hr patch Place 1 patch (21 mg total) onto the skin daily.   PROAIR HFA 108 (90 Base) MCG/ACT inhaler INHALE 2 PUFFS INTO THE LUNGS EVERY 6 HOURS AS NEEDED FOR WHEEZING OR SHORTNESS OF BREATH   sertraline (ZOLOFT) 25 MG tablet Take 25 mg by mouth daily.    PHQ 2/9 Scores 05/04/2021 12/30/2020 12/11/2020 12/07/2020  PHQ - 2 Score _0 PHQ- 9 Score _1 GAD 7 : Generalized Anxiety Score 05/04/2021 12/30/2020 12/11/2020 12/07/2020  Nervous, Anxious, on Edge 1 1 0 0  Control/stop worrying 1 1 0 0  Worry too much - different things 1 1 0 0  Trouble relaxing 0 0 0 0  Restless 0 0 0 0  Easily annoyed or irritable 0 0 0 0  Afraid - awful might happen 0 0 0 0  Total GAD 7 Score 3 3 0 0  Anxiety Difficulty Not difficult at all - - -    BP Readings from Last 3 Encounters:  05/04/21 124/70  03/02/21 (!) 122/56  12/30/20 94/62    Physical Exam Constitutional:      Appearance: She is underweight.  Cardiovascular:     Rate and Rhythm: Normal rate and regular rhythm.  Pulmonary:     Effort: Pulmonary effort is normal.     Breath sounds: No wheezing or rhonchi.  Abdominal:     General: Abdomen is flat.     Palpations: Abdomen is soft.     Tenderness: There is no abdominal tenderness. There is no right CVA tenderness or left CVA tenderness.  Musculoskeletal:     Cervical back: Normal range of motion.  Lymphadenopathy:     Cervical: No cervical adenopathy.  Neurological:     Mental Status: She is alert.    Wt Readings from Last 3 Encounters:  05/04/21 83 lb (37.6 kg)  03/02/21 86 lb (39 kg)  12/30/20 84 lb (38.1 kg)    BP 124/70    Pulse (!) 120    Ht _2   (1.651 m)    Wt 83 lb (37.6 kg)    BMI 13.81 kg/m   Assessment and Plan: 1. Infective urethritis Recurrent UTI symptoms most recently treated with Macrobid. Will re-culture since UA unremarkable Start premarin cream to urethra nightly - Urine Culture  2. Unintentional weight loss Long standing underweight.  3. Other fatigue Will check labs. Also  needs repeat CT chest in April - Vitamin B12 - VITAMIN D 25 Hydroxy (Vit-D Deficiency, Fractures)   Partially dictated using Editor, commissioning. Any errors are unintentional.  Halina Maidens, MD [facility]  05/04/2021

## 2021-05-04 NOTE — Telephone Encounter (Signed)
Requested Prescriptions  Pending Prescriptions Disp Refills   benazepril (LOTENSIN) 5 MG tablet [Pharmacy Med Name: BENAZEPRIL 5MG  TABLETS] 90 tablet 0    Sig: TAKE 1 TABLET(5 MG) BY MOUTH DAILY     Cardiovascular:  ACE Inhibitors Passed - 05/04/2021  3:36 AM      Passed - Cr in normal range and within 180 days    Creatinine, Ser  Date Value Ref Range Status  12/30/2020 0.58 0.57 - 1.00 mg/dL Final         Passed - K in normal range and within 180 days    Potassium  Date Value Ref Range Status  12/30/2020 4.3 3.5 - 5.2 mmol/L Final         Passed - Patient is not pregnant      Passed - Last BP in normal range    BP Readings from Last 1 Encounters:  03/02/21 (!) 122/56         Passed - Valid encounter within last 6 months    Recent Outpatient Visits          4 months ago Essential hypertension   [facility] Glean Hess, MD   4 months ago COVID-19 virus infection   [facility] Glean Hess, MD   7 months ago UTI symptoms   [place], MD   11 months ago Acute cystitis without hematuria   [facility] Glean Hess, MD   1 year ago Essential hypertension   [facility] Glean Hess, MD      Future Appointments            Today Glean Hess, MD [facility], [place]

## 2021-05-05 LAB — VITAMIN D 25 HYDROXY (VIT D DEFICIENCY, FRACTURES): Vit D, 25-Hydroxy: 17.7 ng/mL — ABNORMAL LOW (ref 30.0–100.0)

## 2021-05-05 LAB — VITAMIN B12: Vitamin B-12: 572 pg/mL (ref 232–1245)

## 2021-05-06 ENCOUNTER — Other Ambulatory Visit: Payer: Self-pay | Admitting: Internal Medicine

## 2021-05-06 ENCOUNTER — Telehealth: Payer: Self-pay | Admitting: Internal Medicine

## 2021-05-06 DIAGNOSIS — J432 Centrilobular emphysema: Secondary | ICD-10-CM

## 2021-05-06 LAB — URINE CULTURE

## 2021-05-06 MED ORDER — ALBUTEROL SULFATE (2.5 MG/3ML) 0.083% IN NEBU: 2.5000 mg | INHALATION_SOLUTION | Freq: Four times a day (QID) | RESPIRATORY_TRACT | 1 refills | Status: AC | PRN

## 2021-05-06 NOTE — Telephone Encounter (Signed)
Copied from [place] [id number]. Topic: Quick Communication - Lab Results (Clinic Use ONLY) >> May 06, 2021  8:33 AM Person, Ival Bible, CMA wrote: Called patient to inform them of recent lab results. When patient returns call, triage nurse may disclose results.  Patient called back for lab results can be reached at Ph# [phone]) [phone]

## 2021-05-06 NOTE — Telephone Encounter (Signed)
Pt given lab results per notes of Dr. Army Melia on 05/06/21. Pt verbalized understanding.    Glean Hess, MD  05/06/2021  8:00 AM EST     Urine culture is negative.

## 2021-05-10 ENCOUNTER — Other Ambulatory Visit: Payer: Self-pay

## 2021-05-10 ENCOUNTER — Telehealth: Payer: Self-pay | Admitting: Internal Medicine

## 2021-05-10 ENCOUNTER — Telehealth: Payer: Self-pay

## 2021-05-10 DIAGNOSIS — J449 Chronic obstructive pulmonary disease, unspecified: Secondary | ICD-10-CM

## 2021-05-10 NOTE — Telephone Encounter (Signed)
Copied from [place] [id number]. Topic: General - Inquiry >> May 10, 2021  3:29 PM Loma Boston wrote: albuterol (PROVENTIL) (2.5 MG/3ML) 0.083% nebulizer solution  Pt called [facility] and was told did not have this particular solution, additionally pt does not have a nebulizer and is hesitating trying to find one as does not know which solutions that are available at pharmacy. Called office, sending CRM to assist pt please advise [phone]  Reason for CRM:

## 2021-05-10 NOTE — Telephone Encounter (Signed)
Spoke to pt let her know that the solution is ready for pick up. Told pt a prescription for a nebulizer has been faxed again. Told pt to call back if she has any problems getting the nebulized. Pt verbalized understanding.  KP

## 2021-05-10 NOTE — Progress Notes (Signed)
error 

## 2021-05-10 NOTE — Telephone Encounter (Signed)
Copied from [place] [id number]. Topic: General - Inquiry >> May 10, 2021  3:29 PM Loma Boston wrote: albuterol (PROVENTIL) (2.5 MG/3ML) 0.083% nebulizer solution  Pt called [facility] and was told did not have this particular solution, additionally pt does not have a nebulizer and is hesitating trying to find one as does not know which solutions that are available at pharmacy. Called office, sending CRM to assist pt please advise [phone]  Reason for CRM:

## 2021-05-11 ENCOUNTER — Telehealth: Payer: Self-pay

## 2021-05-11 NOTE — Telephone Encounter (Signed)
Called pt let her know that I called the pharmacy and gave a verbal order for a nebulizer machine. Pt verbalized understanding.  KP

## 2021-05-12 DIAGNOSIS — Z419 Encounter for procedure for purposes other than remedying health state, unspecified: Secondary | ICD-10-CM | POA: Diagnosis not present

## 2021-05-12 NOTE — Telephone Encounter (Signed)
Pt called in says prior auth needs to be sent to [facility] and not the pharmacy. [employer] fx # is [phone]

## 2021-05-13 NOTE — Telephone Encounter (Signed)
Called pt left VM to call back. Per Dr. Army Melia we do not process prior authorization for nebulizer machines. Let pt know that her insurance does not cover a nebulizer. Pt should continue to use inhaler.  [facility] nurse may give results to patient if they return call to clinic, a CRM has been created.   KP

## 2021-05-13 NOTE — Telephone Encounter (Signed)
Patient called and advised of the below message from Dr. Army Melia. She verbalized understanding and says that when she spoke to the person at [facility], they said that insurance will pay for a certain type of nebulizer. She says [facility] called and told her she could pick up a nebulizer after 11am tomorrow, so she says she will see what's going on tomorrow. I advised to let [facility] know what Dr. Army Melia says, she says she will call tomorrow after 11 am if she doesn't get the nebulizer.

## 2021-06-01 ENCOUNTER — Other Ambulatory Visit: Payer: Self-pay | Admitting: Internal Medicine

## 2021-06-01 DIAGNOSIS — I10 Essential (primary) hypertension: Secondary | ICD-10-CM

## 2021-06-01 NOTE — Telephone Encounter (Signed)
Requested Prescriptions  Pending Prescriptions Disp Refills   diltiazem (TIAZAC) 240 MG 24 hr capsule [Pharmacy Med Name: DILTIAZEM ER 240MG  CAPSULES (24 HR)] 30 capsule 0    Sig: TAKE 1 CAPSULE(240 MG) BY MOUTH DAILY     Cardiovascular: Calcium Channel Blockers 3 Failed - 06/01/2021  7:55 AM      Failed - Last Heart Rate in normal range    Pulse Readings from Last 1 Encounters:  05/04/21 (!) 120         Passed - ALT in normal range and within 360 days    ALT  Date Value Ref Range Status  12/30/2020 24 0 - 32 IU/L Final         Passed - AST in normal range and within 360 days    AST  Date Value Ref Range Status  12/30/2020 30 0 - 40 IU/L Final         Passed - Cr in normal range and within 360 days    Creatinine, Ser  Date Value Ref Range Status  12/30/2020 0.58 0.57 - 1.00 mg/dL Final         Passed - Last BP in normal range    BP Readings from Last 1 Encounters:  05/04/21 124/70         Passed - Valid encounter within last 6 months    Recent Outpatient Visits          4 weeks ago Infective urethritis   [facility] Glean Hess, MD   5 months ago Essential hypertension   [facility] Glean Hess, MD   5 months ago COVID-19 virus infection   [facility] Glean Hess, MD   8 months ago UTI symptoms   [facility] Glean Hess, MD   12 months ago Acute cystitis without hematuria   [facility] Glean Hess, MD      Future Appointments            In 1 month Army Melia, Jesse Sans, MD [facility], [facility]

## 2021-06-04 ENCOUNTER — Other Ambulatory Visit: Payer: Self-pay | Admitting: Internal Medicine

## 2021-06-04 DIAGNOSIS — I10 Essential (primary) hypertension: Secondary | ICD-10-CM

## 2021-06-04 DIAGNOSIS — I700 Atherosclerosis of aorta: Secondary | ICD-10-CM

## 2021-06-04 DIAGNOSIS — J438 Other emphysema: Secondary | ICD-10-CM

## 2021-06-04 NOTE — Telephone Encounter (Signed)
Benazepril refilled 05/04/2021 #90 0 refills - Should last until 08/02/2021.  Diltiazem refilled 06/01/2021 #30 0 refill - should have 30 day supply.  Requested Prescriptions  Pending Prescriptions Disp Refills   benazepril (LOTENSIN) 5 MG tablet [Pharmacy Med Name: BENAZEPRIL 5MG  TABLETS] 90 tablet 0    Sig: TAKE 1 TABLET(5 MG) BY MOUTH DAILY     Cardiovascular:  ACE Inhibitors Passed - 06/04/2021  3:28 PM      Passed - Cr in normal range and within 180 days    Creatinine, Ser  Date Value Ref Range Status  12/30/2020 0.58 0.57 - 1.00 mg/dL Final         Passed - K in normal range and within 180 days    Potassium  Date Value Ref Range Status  12/30/2020 4.3 3.5 - 5.2 mmol/L Final         Passed - Patient is not pregnant      Passed - Last BP in normal range    BP Readings from Last 1 Encounters:  05/04/21 124/70         Passed - Valid encounter within last 6 months    Recent Outpatient Visits          1 month ago Infective urethritis   [facility] Glean Hess, MD   5 months ago Essential hypertension   [facility] Glean Hess, MD   5 months ago COVID-19 virus infection   [facility] Glean Hess, MD   8 months ago UTI symptoms   [facility] Glean Hess, MD   12 months ago Acute cystitis without hematuria   [facility] Glean Hess, MD      Future Appointments            In 4 weeks Glean Hess, MD [facility], [facility]            atorvastatin (LIPITOR) 40 MG tablet [Pharmacy Med Name: ATORVASTATIN 40MG  TABLETS] 30 tablet 5    Sig: TAKE 1 TABLET(40 MG) BY MOUTH DAILY     Cardiovascular:  Antilipid - Statins Failed - 06/04/2021  3:28 PM      Failed - Lipid Panel in normal range within the last 12 months    Cholesterol, Total  Date Value Ref Range Status  12/30/2020 187 100 - 199 mg/dL Final   LDL Chol Calc (NIH)  Date Value Ref Range Status  12/30/2020 65 0 - 99 mg/dL  Final   HDL  Date Value Ref Range Status  12/30/2020 110 >39 mg/dL Final   Triglycerides  Date Value Ref Range Status  12/30/2020 65 0 - 149 mg/dL Final         Passed - Patient is not pregnant      Passed - Valid encounter within last 12 months    Recent Outpatient Visits          1 month ago Infective urethritis   [facility] Glean Hess, MD   5 months ago Essential hypertension   [place], Laura H, MD   5 months ago COVID-19 virus infection   [facility] Glean Hess, MD   8 months ago UTI symptoms   [facility] Glean Hess, MD   12 months ago Acute cystitis without hematuria   [facility] Glean Hess, MD      Future Appointments  In 4 weeks Glean Hess, MD [facility], [place] 17 MCG/ACT inhaler [Pharmacy Med Name: ATROVENT HFA ORAL INHALER (200 INH)] 12.9 g 4    Sig: INHALE 2 PUFFS BY MOUTH EVERY 6 HOURS AS NEEDED FOR WHEEZING     Pulmonology:  Anticholinergic Agents Passed - 06/04/2021  3:28 PM      Passed - Valid encounter within last 12 months    Recent Outpatient Visits          1 month ago Infective urethritis   [facility] Glean Hess, MD   5 months ago Essential hypertension   [facility] Glean Hess, MD   5 months ago COVID-19 virus infection   [facility] Glean Hess, MD   8 months ago UTI symptoms   [facility] Glean Hess, MD   12 months ago Acute cystitis without hematuria   [facility] Glean Hess, MD      Future Appointments            In 4 weeks Glean Hess, MD [facility], [facility]            diltiazem [facility]) 240 MG 24 hr capsule [Pharmacy Med Name: DILTIAZEM ER 240MG  CAPSULES (24 HR)] 30 capsule 0    Sig: GIVE "Kendra Mullins" 1 CAPSULE(240 MG) BY MOUTH DAILY     Cardiovascular: Calcium Channel Blockers 3 Failed -  06/04/2021  3:28 PM      Failed - Last Heart Rate in normal range    Pulse Readings from Last 1 Encounters:  05/04/21 (!) 120         Passed - ALT in normal range and within 360 days    ALT  Date Value Ref Range Status  12/30/2020 24 0 - 32 IU/L Final         Passed - AST in normal range and within 360 days    AST  Date Value Ref Range Status  12/30/2020 30 0 - 40 IU/L Final         Passed - Cr in normal range and within 360 days    Creatinine, Ser  Date Value Ref Range Status  12/30/2020 0.58 0.57 - 1.00 mg/dL Final         Passed - Last BP in normal range    BP Readings from Last 1 Encounters:  05/04/21 124/70         Passed - Valid encounter within last 6 months    Recent Outpatient Visits          1 month ago Infective urethritis   [facility] Glean Hess, MD   5 months ago Essential hypertension   [place], Laura H, MD   5 months ago COVID-19 virus infection   [facility] Glean Hess, MD   8 months ago UTI symptoms   [facility] Glean Hess, MD   12 months ago Acute cystitis without hematuria   [facility] Glean Hess, MD      Future Appointments            In 4 weeks Army Melia Jesse Sans, MD [facility], [facility]

## 2021-06-09 DIAGNOSIS — Z419 Encounter for procedure for purposes other than remedying health state, unspecified: Secondary | ICD-10-CM | POA: Diagnosis not present

## 2021-06-11 DIAGNOSIS — Z79899 Other long term (current) drug therapy: Secondary | ICD-10-CM | POA: Diagnosis not present

## 2021-06-11 DIAGNOSIS — F410 Panic disorder [episodic paroxysmal anxiety] without agoraphobia: Secondary | ICD-10-CM | POA: Diagnosis not present

## 2021-06-11 DIAGNOSIS — F331 Major depressive disorder, recurrent, moderate: Secondary | ICD-10-CM | POA: Diagnosis not present

## 2021-07-02 ENCOUNTER — Ambulatory Visit: Payer: Medicaid Other | Admitting: Internal Medicine

## 2021-07-02 NOTE — Progress Notes (Incomplete)
07/02/21 ?12:47 PM  ? ?Kendra Mullins ?09/07/1960 ?[id number] ? ?Referring provider:  ?Glean Hess, MD ?[address] ?[address] ?[place],  [place] [address] ?No chief complaint on file. ? ? ?Urological history  ?History of UTIs  ?- Managed on vaginal estrogen cream and UTI prevention supplements such as cranberry tablets, probiotics, yogurt that has active lactobacillus culture, and d-mannose  ? ? ? ?HPI: ?Kendra Mullins is a 61 y.o.female who presents today for a 4 month follow-up on rUTIs.  ? ? ? ? ? ?PMH: ?Past Medical History:  ?Diagnosis Date  ? Ankle inflammation 09/15/2014  ? COPD (chronic obstructive pulmonary disease) ([place])   ? Hypertension   ? PTSD (post-traumatic stress disorder)   ? ? ?Surgical History: ?Past Surgical History:  ?Procedure Laterality Date  ? ABDOMINAL HYSTERECTOMY  1986  ? one ovary remains  ? ? ?Home Medications:  ?Allergies as of 07/05/2021   ? ?   Reactions  ? Azithromycin Nausea Only  ? Codeine Nausea And Vomiting  ? ?  ? ?  ?Medication List  ?  ? ?  ? Accurate as of July 02, 2021 12:47 PM. If you have any questions, ask your nurse or doctor.  ?  ?  ? ?  ? ?ALPRAZolam 0.5 MG tablet ?Commonly known as: Duanne Moron ?Take 0.5 mg by mouth 2 (two) times daily as needed for anxiety. ?  ?Aspirin Low Dose 81 MG EC tablet ?Generic drug: aspirin ?TAKE 1 TABLET BY MOUTH ONCE DAILY ?  ?atorvastatin 40 MG tablet ?Commonly known as: LIPITOR ?TAKE 1 TABLET(40 MG) BY MOUTH DAILY ?  ?Atrovent HFA 17 MCG/ACT inhaler ?Generic drug: ipratropium ?INHALE 2 PUFFS BY MOUTH EVERY 6 HOURS AS NEEDED FOR WHEEZING ?  ?benazepril 5 MG tablet ?Commonly known as: LOTENSIN ?TAKE 1 TABLET(5 MG) BY MOUTH DAILY ?  ?Blood Pressure Monitor Automat Devi ?1 each by Does not apply route daily at 6 (six) AM. Use to check BP daily. ?  ?conjugated estrogens vaginal cream ?Commonly known as: PREMARIN ?Fingertip amount applied to urethral meatus nightly ?  ?diltiazem 240 MG 24 hr capsule ?Commonly known as: TIAZAC ?TAKE 1  CAPSULE(240 MG) BY MOUTH DAILY ?  ?fluticasone 50 MCG/ACT nasal spray ?Commonly known as: FLONASE ?SHAKE LIQUID AND USE 2 SPRAYS IN EACH NOSTRIL DAILY ?  ?nicotine 21 mg/24hr patch ?Commonly known as: NICODERM CQ - dosed in mg/24 hours ?Place 1 patch (21 mg total) onto the skin daily. ?  ?ProAir HFA 108 (90 Base) MCG/ACT inhaler ?Generic drug: albuterol ?INHALE 2 PUFFS INTO THE LUNGS EVERY 6 HOURS AS NEEDED FOR WHEEZING OR SHORTNESS OF BREATH ?  ?albuterol (2.5 MG/3ML) 0.083% nebulizer solution ?Commonly known as: PROVENTIL ?Take 3 mLs (2.5 mg total) by nebulization every 6 (six) hours as needed for wheezing or shortness of breath. ?  ?sertraline 25 MG tablet ?Commonly known as: ZOLOFT ?Take 25 mg by mouth daily. ?  ? ?  ? ? ?Allergies:  ?Allergies  ?Allergen Reactions  ? Azithromycin Nausea Only  ? Codeine Nausea And Vomiting  ? ? ?Family History: ?Family History  ?Problem Relation Age of Onset  ? Cancer Mother   ?     breast  ? ? ?Social History:  reports that she has been smoking cigarettes. She has been smoking an average of .5 packs per day. She has never used smokeless tobacco. She reports current alcohol use of about 50.0 standard drinks per week. She reports that she does not use drugs. ? ? ?  Physical Exam: ?There were no vitals taken for this visit.  ?Constitutional:  Alert and oriented, No acute distress. ?HEENT: [place] AT, moist mucus membranes.  Trachea midline, no masses. ?Cardiovascular: No clubbing, cyanosis, or edema. ?Respiratory: Normal respiratory effort, no increased work of breathing. ?GI: Abdomen is soft, nontender, nondistended, no abdominal masses ?GU: No CVA tenderness ?Lymph: No cervical or inguinal lymphadenopathy. ?Skin: No rashes, bruises or suspicious lesions. ?Neurologic: Grossly intact, no focal deficits, moving all 4 extremities. ?Psychiatric: Normal mood and affect. ? ?Laboratory Data: ? ?Lab Results  ?Component Value Date  ? CREATININE 0.58 12/30/2020  ? ?Component ?    Latest Ref Rng  05/04/2021  ?Vitamin B12 ?    232 - 1,245 pg/mL 572   ? ? ?Component ?    Latest Ref Rng 05/04/2021  ?Vitamin D, 25-Hydroxy ?    30.0 - 100.0 ng/mL 17.7 (L)   ?  ?(L) Low ? ?Urinalysis ? ? ?Pertinent Imaging: ? ? ?Assessment & Plan:   ? ? ?No follow-ups on file. ? ?[facility] ?[address] ?[place], [place] [address] ?[phone] ? ?I,Kailey Littlejohn,acting as a Education administrator for [employer], PA-C.,have documented all relevant documentation on the behalf of [place], PA-C,as directed by  [facility], PA-C while in the presence of [place], PA-C. ? ?

## 2021-07-05 ENCOUNTER — Other Ambulatory Visit: Payer: Self-pay | Admitting: Internal Medicine

## 2021-07-05 ENCOUNTER — Ambulatory Visit: Payer: Medicaid Other | Admitting: Urology

## 2021-07-05 DIAGNOSIS — I10 Essential (primary) hypertension: Secondary | ICD-10-CM

## 2021-07-07 NOTE — Telephone Encounter (Signed)
Duplicate request

## 2021-07-07 NOTE — Telephone Encounter (Signed)
Requested medication (s) are due for refill today: yes ? ?Requested medication (s) are on the active medication list: yes ? ?Last refill:  06/01/21 #30/0 ? ?Future visit scheduled: no ? ?Notes to clinic:  07/02/21 appt was canceled. Please advise for refill.  ? ? ?  ?Requested Prescriptions  ?Pending Prescriptions Disp Refills  ? diltiazem (TIAZAC) 240 MG 24 hr capsule [Pharmacy Med Name: DILTIAZEM ER 240MG  CAPSULES (24 HR)] 30 capsule 0  ?  Sig: TAKE 1 CAPSULE(240 MG) BY MOUTH DAILY  ?  ? Cardiovascular: Calcium Channel Blockers 3 Failed - 07/05/2021  6:03 PM  ?  ?  Failed - Last Heart Rate in normal range  ?  Pulse Readings from Last 1 Encounters:  ?05/04/21 (!) 120  ?  ?  ?  ?  Passed - ALT in normal range and within 360 days  ?  ALT  ?Date Value Ref Range Status  ?12/30/2020 24 0 - 32 IU/L Final  ?  ?  ?  ?  Passed - AST in normal range and within 360 days  ?  AST  ?Date Value Ref Range Status  ?12/30/2020 30 0 - 40 IU/L Final  ?  ?  ?  ?  Passed - Cr in normal range and within 360 days  ?  Creatinine, Ser  ?Date Value Ref Range Status  ?12/30/2020 0.58 0.57 - 1.00 mg/dL Final  ?  ?  ?  ?  Passed - Last BP in normal range  ?  BP Readings from Last 1 Encounters:  ?05/04/21 124/70  ?  ?  ?  ?  Passed - Valid encounter within last 6 months  ?  Recent Outpatient Visits   ? ?      ? 2 months ago Infective urethritis  ? [facility] Glean Hess, MD  ? 6 months ago Essential hypertension  ? [facility] Glean Hess, MD  ? 7 months ago COVID-19 virus infection  ? [facility] Glean Hess, MD  ? 10 months ago UTI symptoms  ? [facility] Glean Hess, MD  ? 1 year ago Acute cystitis without hematuria  ? [facility] Glean Hess, MD  ? ?  ?  ? ?  ?  ?  ? ?

## 2021-07-08 ENCOUNTER — Telehealth: Payer: Self-pay | Admitting: Internal Medicine

## 2021-07-08 NOTE — Telephone Encounter (Signed)
Pt has an appt tomorrow a refill will be sent in at appt. ? ?[place] nurse may give results to patient if they return call to clinic, a CRM has been created. ? ?KP ?

## 2021-07-08 NOTE — Telephone Encounter (Signed)
KP

## 2021-07-08 NOTE — Telephone Encounter (Signed)
The patient would like to speak with a member of staff about the denied refill request of their diltiazem (TIAZAC) 240 MG 24 hr capsule [[id number]]  ? ?The patient has scheduled a med follow up appt for 07/09/21 at 2:40 PM with their PCP ? ?Please contact further when available  ?

## 2021-07-09 ENCOUNTER — Encounter: Payer: Self-pay | Admitting: Internal Medicine

## 2021-07-09 ENCOUNTER — Ambulatory Visit (INDEPENDENT_AMBULATORY_CARE_PROVIDER_SITE_OTHER): Payer: Medicaid Other | Admitting: Internal Medicine

## 2021-07-09 VITALS — BP 124/66 | HR 104 | Ht 65.0 in | Wt 84.0 lb

## 2021-07-09 DIAGNOSIS — I700 Atherosclerosis of aorta: Secondary | ICD-10-CM | POA: Diagnosis not present

## 2021-07-09 DIAGNOSIS — I10 Essential (primary) hypertension: Secondary | ICD-10-CM

## 2021-07-09 DIAGNOSIS — R918 Other nonspecific abnormal finding of lung field: Secondary | ICD-10-CM | POA: Diagnosis not present

## 2021-07-09 DIAGNOSIS — F1020 Alcohol dependence, uncomplicated: Secondary | ICD-10-CM | POA: Diagnosis not present

## 2021-07-09 DIAGNOSIS — J432 Centrilobular emphysema: Secondary | ICD-10-CM

## 2021-07-09 DIAGNOSIS — C3492 Malignant neoplasm of unspecified part of left bronchus or lung: Secondary | ICD-10-CM

## 2021-07-09 MED ORDER — DILTIAZEM HCL ER BEADS 240 MG PO CP24: ORAL_CAPSULE | ORAL | 1 refills | Status: DC

## 2021-07-09 MED ORDER — BENAZEPRIL HCL 5 MG PO TABS: 5.0000 mg | ORAL_TABLET | Freq: Every day | ORAL | 1 refills | Status: DC

## 2021-07-09 NOTE — Progress Notes (Signed)
? ? ?Date:  07/09/2021  ? ?Name:  Kendra Mullins   DOB:  20-May-1960   MRN:  [id number] ? ? ?Chief Complaint: Hypertension ? ?Hypertension ?This is a chronic problem. The problem is controlled. Pertinent negatives include no chest pain, headaches, palpitations or shortness of breath. Past treatments include calcium channel blockers and ACE inhibitors.  ? Abnormal Lung CT - noted in 01/2020 - suspected NSCLC but on staging CT the lesion had spontaneously decreased.  Repeat CT 07/2020 was even smaller.  Recommended repeat CT in one year. ?Lab Results  ?Component Value Date  ? NA 136 12/30/2020  ? K 4.3 12/30/2020  ? CO2 22 12/30/2020  ? GLUCOSE 93 12/30/2020  ? BUN 9 12/30/2020  ? CREATININE 0.58 12/30/2020  ? CALCIUM 9.7 12/30/2020  ? EGFR 104 12/30/2020  ? GFRNONAA 102 10/25/2019  ? ?Lab Results  ?Component Value Date  ? CHOL 187 12/30/2020  ? HDL 110 12/30/2020  ? [place] 65 12/30/2020  ? TRIG 65 12/30/2020  ? CHOLHDL 1.7 12/30/2020  ? ?Lab Results  ?Component Value Date  ? TSH 2.120 12/30/2020  ? ?No results found for: HGBA1C ?Lab Results  ?Component Value Date  ? WBC 10.1 12/30/2020  ? HGB 13.4 12/30/2020  ? HCT 38.7 12/30/2020  ? MCV 94 12/30/2020  ? PLT 374 12/30/2020  ? ?Lab Results  ?Component Value Date  ? ALT 24 12/30/2020  ? AST 30 12/30/2020  ? ALKPHOS 126 (H) 12/30/2020  ? BILITOT 0.4 12/30/2020  ? ?Lab Results  ?Component Value Date  ? VD25OH 17.7 (L) 05/04/2021  ?  ? ?Review of Systems  ?Constitutional:  Negative for chills, fatigue and unexpected weight change.  ?HENT:  Negative for nosebleeds.   ?Eyes:  Negative for visual disturbance.  ?Respiratory:  Negative for cough, chest tightness, shortness of breath and wheezing.   ?Cardiovascular:  Negative for chest pain, palpitations and leg swelling.  ?Gastrointestinal:  Negative for abdominal pain, constipation and diarrhea.  ?Genitourinary:  Negative for frequency and urgency.  ?Neurological:  Negative for dizziness, weakness, light-headedness and  headaches.  ?Psychiatric/Behavioral:  Negative for dysphoric mood and sleep disturbance. The patient is not nervous/anxious.   ? ?Patient Active Problem List  ? Diagnosis Date Noted  ? Status post hysterectomy 05/19/2020  ? NSCLC of left lung ([place]) 01/29/2020  ? Closed fracture of head of left humerus 05/15/2019  ? Aortic atherosclerosis ([place]) 12/31/2017  ? TIA (transient ischemic attack) 06/25/2017  ? Localized primary osteoarthritis of hands, bilateral 05/02/2017  ? Continuous chronic alcoholism ([place]) 09/15/2014  ? Anxiety and depression 09/15/2014  ? Cough, persistent 09/15/2014  ? Tobacco use disorder 09/15/2014  ? Essential hypertension 09/15/2014  ? Hot flash, menopausal 09/15/2014  ? MI (mitral incompetence) 09/15/2014  ? Centrilobular emphysema ([place]) 09/15/2014  ? Low weight 09/15/2014  ? ? ?Allergies  ?Allergen Reactions  ? Azithromycin Nausea Only  ? Codeine Nausea And Vomiting  ? ? ?Past Surgical History:  ?Procedure Laterality Date  ? ABDOMINAL HYSTERECTOMY  1986  ? one ovary remains  ? ? ?Social History  ? ?Tobacco Use  ? Smoking status: Every Day  ?  Packs/day: 0.50  ?  Types: Cigarettes  ? Smokeless tobacco: Never  ? Tobacco comments:  ?  using generic nicodern cq patches to try to quit  ?Vaping Use  ? Vaping Use: Every day  ?Substance Use Topics  ? Alcohol use: Yes  ?  Alcohol/week: 50.0 standard drinks  ?  Types: 50 Standard drinks  or equivalent per week  ? Drug use: No  ? ? ? ?Medication list has been reviewed and updated. ? ?Current Meds  ?Medication Sig  ? albuterol (PROVENTIL) (2.5 MG/3ML) 0.083% nebulizer solution Take 3 mLs (2.5 mg total) by nebulization every 6 (six) hours as needed for wheezing or shortness of breath.  ? ALPRAZolam (XANAX) 0.5 MG tablet Take 0.5 mg by mouth 2 (two) times daily as needed for anxiety.  ? ASPIRIN LOW DOSE 81 MG EC tablet TAKE 1 TABLET BY MOUTH ONCE DAILY  ? atorvastatin (LIPITOR) 40 MG tablet TAKE 1 TABLET(40 MG) BY MOUTH DAILY  ? ATROVENT HFA 17 MCG/ACT  inhaler INHALE 2 PUFFS BY MOUTH EVERY 6 HOURS AS NEEDED FOR WHEEZING  ? benazepril (LOTENSIN) 5 MG tablet TAKE 1 TABLET(5 MG) BY MOUTH DAILY  ? Blood Pressure Monitoring (BLOOD PRESSURE MONITOR AUTOMAT) DEVI 1 each by Does not apply route daily at 6 (six) AM. Use to check BP daily.  ? diltiazem (TIAZAC) 240 MG 24 hr capsule TAKE 1 CAPSULE(240 MG) BY MOUTH DAILY  ? fluticasone (FLONASE) 50 MCG/ACT nasal spray SHAKE LIQUID AND USE 2 SPRAYS IN EACH NOSTRIL DAILY  ? Nebulizers (VIOS AEROSOL DELIVERY SYSTEM) MISC See admin instructions.  ? nicotine (NICODERM CQ - DOSED IN MG/24 HOURS) 21 mg/24hr patch Place 1 patch (21 mg total) onto the skin daily.  ? sertraline (ZOLOFT) 50 MG tablet Take 50 mg by mouth daily. [name]  ? ? ? ?  07/09/2021  ?  3:01 PM 05/04/2021  ?  2:53 PM 12/30/2020  ?  1:39 PM 12/11/2020  ? 11:08 AM  ?GAD 7 : Generalized Anxiety Score  ?Nervous, Anxious, on [place] 1 1 1  0  ?Control/stop worrying 0 1 1 0  ?Worry too much - different things 1 1 1  0  ?Trouble relaxing 0 0 0 0  ?Restless 0 0 0 0  ?Easily annoyed or irritable 1 0 0 0  ?Afraid - awful might happen 0 0 0 0  ?Total GAD 7 Score 3 3 3  0  ?Anxiety Difficulty  Not difficult at all    ? ? ? ?  07/09/2021  ?  3:01 PM  ?Depression screen PHQ 2/9  ?Decreased Interest 1  ?Down, Depressed, Hopeless 1  ?PHQ - 2 Score 2  ?Altered sleeping 1  ?Tired, decreased energy 1  ?Change in appetite 0  ?Feeling bad or failure about yourself  0  ?Trouble concentrating 1  ?Moving slowly or fidgety/restless 0  ?Suicidal thoughts 0  ?PHQ-9 Score 5  ?Difficult doing work/chores Not difficult at all  ? ? ?BP Readings from Last 3 Encounters:  ?07/09/21 124/66  ?05/04/21 124/70  ?03/02/21 (!) 122/56  ? ? ?Physical Exam ?Constitutional:   ?   General: She is not in acute distress. ?Neck:  ?   Vascular: No carotid bruit.  ?Cardiovascular:  ?   Rate and Rhythm: Normal rate and regular rhythm.  ?   Pulses: Normal pulses.  ?   Heart sounds: No murmur heard. ?Pulmonary:  ?    Effort: Pulmonary effort is normal.  ?   Breath sounds: No wheezing or rhonchi.  ?Musculoskeletal:  ?   Cervical back: Normal range of motion.  ?Lymphadenopathy:  ?   Cervical: No cervical adenopathy.  ?Skin: ?   General: Skin is warm and dry.  ?Neurological:  ?   Mental Status: She is alert.  ? ? ?Wt Readings from Last 3 Encounters:  ?07/09/21 84 lb (38.1 kg)  ?  05/04/21 83 lb (37.6 kg)  ?03/02/21 86 lb (39 kg)  ? ? ?BP 124/66   Pulse (!) 104   Ht 5' 5"  (1.651 m)   Wt 84 lb (38.1 kg)   SpO2 95%   BMI 13.98 kg/m?  ? ?Assessment and Plan: ?1. Essential hypertension ?Clinically stable exam with well controlled BP. ?Tolerating medications without side effects at this time. ?Pt to continue current regimen and low sodium diet; benefits of regular exercise as able discussed. ?- diltiazem (TIAZAC) 240 MG 24 hr capsule; TAKE 1 CAPSULE(240 MG) BY MOUTH DAILY  Dispense: 90 capsule; Refill: 1 ?- benazepril (LOTENSIN) 5 MG tablet; Take 1 tablet (5 mg total) by mouth daily.  Dispense: 90 tablet; Refill: 1 ? ?2. NSCLC of left lung ([place]) ?Suspected in 2021 - will repeat CT  ?Despite this she continues to smoke cigarettes ?- CT Chest Wo Contrast; Future ? ?3. Centrilobular emphysema ([place]) ?Seen on CT ?She continues to smoke and is not interested in quitting ? ?4. Alcohol use disorder, severe, dependence ([place]) ?Continues to consume alcohol daily ? ?5. Aortic atherosclerosis ([place]) ?Now on statin therapy ? ?6. Abnormal findings on diagnostic imaging of lung ?- CT Chest Wo Contrast; Future ? ? ?Partially dictated using Editor, commissioning. Any errors are unintentional. ? ?Halina Maidens, MD ?[facility] ?[facility] ? ?07/09/2021 ? ? ? ? ? ?

## 2021-07-10 DIAGNOSIS — Z419 Encounter for procedure for purposes other than remedying health state, unspecified: Secondary | ICD-10-CM | POA: Diagnosis not present

## 2021-07-16 ENCOUNTER — Ambulatory Visit: Payer: Medicaid Other | Attending: Internal Medicine

## 2021-08-06 ENCOUNTER — Other Ambulatory Visit: Payer: Self-pay | Admitting: Internal Medicine

## 2021-08-06 NOTE — Telephone Encounter (Signed)
D/C 06/3121. ?Requested Prescriptions  ?Refused Prescriptions Disp Refills  ?? conjugated estrogens (PREMARIN) vaginal cream [Pharmacy Med Name: PREMARIN VAGINAL CREAM 30GM] 30 g   ?  Sig: APPLY FINGERTIP AMOUNT TO URETHRAL MEATUS NIGHTLY  ?  ? OB/GYN:  Estrogens Failed - 08/06/2021  6:22 AM  ?  ?  Failed - Mammogram is up-to-date per Health Maintenance  ?  ?  Passed - Last BP in normal range  ?  BP Readings from Last 1 Encounters:  ?07/09/21 124/66  ?   ?  ?  Passed - Valid encounter within last 12 months  ?  Recent Outpatient Visits   ?      ? 4 weeks ago NSCLC of left lung ([place])  ? [facility] Glean Hess, MD  ? 3 months ago Infective urethritis  ? [facility] Glean Hess, MD  ? 7 months ago Essential hypertension  ? [facility] Glean Hess, MD  ? 8 months ago COVID-19 virus infection  ? [facility] Glean Hess, MD  ? 11 months ago UTI symptoms  ? [facility] Glean Hess, MD  ?  ?  ?Future Appointments   ?        ? In 5 months [facility] Melia Jesse Sans, MD [facility], [place]  ?  ? ?  ?  ?  ? ?

## 2021-08-09 DIAGNOSIS — Z419 Encounter for procedure for purposes other than remedying health state, unspecified: Secondary | ICD-10-CM | POA: Diagnosis not present

## 2021-08-12 ENCOUNTER — Other Ambulatory Visit: Payer: Self-pay | Admitting: Internal Medicine

## 2021-08-13 NOTE — Telephone Encounter (Signed)
Requested Prescriptions  ?Pending Prescriptions Disp Refills  ?? fluticasone (FLONASE) 50 MCG/ACT nasal spray [Pharmacy Med Name: FLUTICASONE 50MCG NASAL SP (120) RX] 48 g 0  ?  Sig: SHAKE LIQUID AND USE 2 SPRAYS IN EACH NOSTRIL DAILY  ?  ? Ear, Nose, and Throat: Nasal Preparations - Corticosteroids Passed - 08/12/2021  7:18 PM  ?  ?  Passed - Valid encounter within last 12 months  ?  Recent Outpatient Visits   ?      ? 1 month ago NSCLC of left lung ([place])  ? [facility] Glean Hess, MD  ? 3 months ago Infective urethritis  ? [facility] Glean Hess, MD  ? 7 months ago Essential hypertension  ? [facility] Glean Hess, MD  ? 8 months ago COVID-19 virus infection  ? [facility] Glean Hess, MD  ? 11 months ago UTI symptoms  ? [facility] Glean Hess, MD  ?  ?  ?Future Appointments   ?        ? In 5 months Army Melia Jesse Sans, MD [facility], [place]  ?  ? ?  ?  ?  ? ? ?

## 2021-08-25 ENCOUNTER — Telehealth: Payer: Self-pay | Admitting: Internal Medicine

## 2021-08-25 NOTE — Telephone Encounter (Signed)
Copied from [place] [id number]. Topic: Appointment Scheduling - Scheduling Inquiry for Clinic ?>> Aug 25, 2021  5:02 PM Loma [place] wrote: ?Reason for CRM: Pls call pt in am 5:00 office not available. Dr Sharmaine Base pt has a cold, congestion and has gotten in her lungs, she is prone to pneumonia, Dr Ronnald Ramp has an appt at 3:00 5/18 that was available and she would like that? She is really afraid to wait. She is expecting a call 1st am at [phone]. She says she will be holding her phone. I had sch until I read one note so I explain office would fu. Sorry. ?

## 2021-08-26 ENCOUNTER — Telehealth: Payer: Self-pay | Admitting: Internal Medicine

## 2021-08-26 ENCOUNTER — Ambulatory Visit: Payer: Medicaid Other | Admitting: Family Medicine

## 2021-08-26 ENCOUNTER — Ambulatory Visit: Payer: Medicaid Other | Admitting: Internal Medicine

## 2021-08-26 NOTE — Telephone Encounter (Signed)
Copied from [place] [id number]. Topic: General - Other >> Aug 26, 2021 11:17 AM Tessa Lerner A wrote: The patient has returned a missed call from staff member K. P.   Please contact further when possible

## 2021-08-26 NOTE — Telephone Encounter (Signed)
Called pt left VM to call back. ? ?[facility] nurse may give results to patient if they return call to clinic, a CRM has been created. ? ?KP

## 2021-08-26 NOTE — Telephone Encounter (Signed)
Pt scheduled to see Dr. Zigmund Daniel today.  KP

## 2021-08-27 ENCOUNTER — Ambulatory Visit (INDEPENDENT_AMBULATORY_CARE_PROVIDER_SITE_OTHER): Payer: Medicaid Other | Admitting: Internal Medicine

## 2021-08-27 ENCOUNTER — Encounter: Payer: Self-pay | Admitting: Internal Medicine

## 2021-08-27 VITALS — BP 118/70 | HR 106 | Temp 97.9°F | Ht 65.0 in | Wt 85.0 lb

## 2021-08-27 DIAGNOSIS — L608 Other nail disorders: Secondary | ICD-10-CM | POA: Diagnosis not present

## 2021-08-27 DIAGNOSIS — C3492 Malignant neoplasm of unspecified part of left bronchus or lung: Secondary | ICD-10-CM

## 2021-08-27 DIAGNOSIS — J441 Chronic obstructive pulmonary disease with (acute) exacerbation: Secondary | ICD-10-CM | POA: Diagnosis not present

## 2021-08-27 MED ORDER — PREDNISONE 10 MG PO TABS: 10.0000 mg | ORAL_TABLET | ORAL | 0 refills | Status: AC

## 2021-08-27 MED ORDER — GUAIFENESIN-CODEINE 100-10 MG/5ML PO SYRP: 5.0000 mL | ORAL_SOLUTION | Freq: Three times a day (TID) | ORAL | 0 refills | Status: DC | PRN

## 2021-08-27 MED ORDER — AMOXICILLIN-POT CLAVULANATE 875-125 MG PO TABS: 1.0000 | ORAL_TABLET | Freq: Two times a day (BID) | ORAL | 0 refills | Status: AC

## 2021-08-27 NOTE — Progress Notes (Signed)
Date:  08/27/2021   Name:  Kendra Mullins   DOB:  November 29, 1960   MRN:  [id number]   Chief Complaint: Cough  Cough This is a new problem. The current episode started 1 to 4 weeks ago (2 weeks). The problem has been gradually worsening. The problem occurs every few minutes. The cough is Productive of sputum. Associated symptoms include chills, nasal congestion, postnasal drip and a sore throat. Pertinent negatives include no chest pain, fever, headaches, myalgias, shortness of breath or wheezing. The symptoms are aggravated by exercise and lying down. She has tried a beta-agonist inhaler for the symptoms. Her past medical history is significant for COPD.   Lab Results  Component Value Date   NA 136 12/30/2020   K 4.3 12/30/2020   CO2 22 12/30/2020   GLUCOSE 93 12/30/2020   BUN 9 12/30/2020   CREATININE 0.58 12/30/2020   CALCIUM 9.7 12/30/2020   EGFR 104 12/30/2020   GFRNONAA 102 10/25/2019   Lab Results  Component Value Date   CHOL 187 12/30/2020   HDL 110 12/30/2020   LDLCALC 65 12/30/2020   TRIG 65 12/30/2020   CHOLHDL 1.7 12/30/2020   Lab Results  Component Value Date   TSH 2.120 12/30/2020   No results found for: HGBA1C Lab Results  Component Value Date   WBC 10.1 12/30/2020   HGB 13.4 12/30/2020   HCT 38.7 12/30/2020   MCV 94 12/30/2020   PLT 374 12/30/2020   Lab Results  Component Value Date   ALT 24 12/30/2020   AST 30 12/30/2020   ALKPHOS 126 (H) 12/30/2020   BILITOT 0.4 12/30/2020   Lab Results  Component Value Date   VD25OH 17.7 (L) 05/04/2021     Review of Systems  Constitutional:  Positive for chills. Negative for fever and unexpected weight change.  HENT:  Positive for postnasal drip and sore throat. Negative for congestion and sinus pressure.   Respiratory:  Positive for cough and chest tightness. Negative for shortness of breath and wheezing.   Cardiovascular:  Negative for chest pain, palpitations and leg swelling.  Musculoskeletal:   Negative for arthralgias and myalgias.  Skin:        Nail problem  Neurological:  Negative for dizziness, light-headedness and headaches.   Patient Active Problem List   Diagnosis Date Noted   S/P total hysterectomy 05/19/2020   NSCLC of left lung ([place]) 01/29/2020   Closed fracture of head of left humerus 05/15/2019   Aortic atherosclerosis ([place]) 12/31/2017   TIA (transient ischemic attack) 06/25/2017   Localized primary osteoarthritis of hands, bilateral 05/02/2017   Continuous chronic alcoholism ([place]) 09/15/2014   Anxiety and depression 09/15/2014   Tobacco use disorder 09/15/2014   Essential hypertension 09/15/2014   Hot flash, menopausal 09/15/2014   MI (mitral incompetence) 09/15/2014   Centrilobular emphysema ([place]) 09/15/2014   Low weight 09/15/2014    Allergies  Allergen Reactions   Azithromycin Nausea Only   Codeine Nausea And Vomiting    Past Surgical History:  Procedure Laterality Date   TOTAL ABDOMINAL HYSTERECTOMY  1986   one ovary remains    Social History   Tobacco Use   Smoking status: Every Day    Packs/day: 0.50    Types: Cigarettes   Smokeless tobacco: Never  Vaping Use   Vaping Use: Every day  Substance Use Topics   Alcohol use: Yes    Alcohol/week: 50.0 standard drinks    Types: 50 Standard drinks or equivalent per week  Drug use: No     Medication list has been reviewed and updated.  Current Meds  Medication Sig   albuterol (PROVENTIL) (2.5 MG/3ML) 0.083% nebulizer solution Take 3 mLs (2.5 mg total) by nebulization every 6 (six) hours as needed for wheezing or shortness of breath.   ALPRAZolam (XANAX) 0.5 MG tablet Take 0.5 mg by mouth 2 (two) times daily as needed for anxiety.   ASPIRIN LOW DOSE 81 MG EC tablet TAKE 1 TABLET BY MOUTH ONCE DAILY   atorvastatin (LIPITOR) 40 MG tablet TAKE 1 TABLET(40 MG) BY MOUTH DAILY   ATROVENT HFA 17 MCG/ACT inhaler INHALE 2 PUFFS BY MOUTH EVERY 6 HOURS AS NEEDED FOR WHEEZING   benazepril (LOTENSIN)  5 MG tablet Take 1 tablet (5 mg total) by mouth daily.   Blood Pressure Monitoring (BLOOD PRESSURE MONITOR AUTOMAT) DEVI 1 each by Does not apply route daily at 6 (six) AM. Use to check BP daily.   diltiazem (TIAZAC) 240 MG 24 hr capsule TAKE 1 CAPSULE(240 MG) BY MOUTH DAILY   fluticasone (FLONASE) 50 MCG/ACT nasal spray SHAKE LIQUID AND USE 2 SPRAYS IN EACH NOSTRIL DAILY   Nebulizers (VIOS AEROSOL DELIVERY SYSTEM) MISC See admin instructions.   nicotine (NICODERM CQ - DOSED IN MG/24 HOURS) 21 mg/24hr patch Place 1 patch (21 mg total) onto the skin daily.   sertraline (ZOLOFT) 50 MG tablet Take 50 mg by mouth daily. [name]       08/27/2021    4:11 PM 07/09/2021    3:01 PM 05/04/2021    2:53 PM 12/30/2020    1:39 PM  GAD 7 : Generalized Anxiety Score  Nervous, Anxious, on Edge 1 1 1 1   Control/stop worrying 1 0 1 1  Worry too much - different things 1 1 1 1   Trouble relaxing 0 0 0 0  Restless 1 0 0 0  Easily annoyed or irritable 0 1 0 0  Afraid - awful might happen 0 0 0 0  Total GAD 7 Score 4 3 3 3   Anxiety Difficulty Not difficult at all  Not difficult at all        08/27/2021    4:10 PM  Depression screen PHQ 2/9  Decreased Interest 2  Down, Depressed, Hopeless 2  PHQ - 2 Score 4  Altered sleeping 0  Tired, decreased energy 2  Change in appetite 0  Feeling bad or failure about yourself  0  Trouble concentrating 1  Moving slowly or fidgety/restless 1  Suicidal thoughts 0  PHQ-9 Score 8  Difficult doing work/chores Somewhat difficult    BP Readings from Last 3 Encounters:  08/27/21 118/70  07/09/21 124/66  05/04/21 124/70    Physical Exam Vitals and nursing note reviewed.  Constitutional:      General: She is not in acute distress.    Appearance: She is well-developed. She is ill-appearing.  HENT:     Head: Normocephalic and atraumatic.  Cardiovascular:     Rate and Rhythm: Normal rate and regular rhythm.     Pulses: Normal pulses.  Pulmonary:      Effort: Accessory muscle usage and prolonged expiration present. No respiratory distress.     Breath sounds: Decreased air movement present. Decreased breath sounds present.  Musculoskeletal:     Cervical back: Normal range of motion.  Lymphadenopathy:     Cervical: No cervical adenopathy.  Skin:    General: Skin is warm and dry.     Findings: No rash.  Comments: Right thumb nail is deformed -caved inward with inflammation and retraction of the cuticle.  Neurological:     Mental Status: She is alert and oriented to person, place, and time.  Psychiatric:        Mood and Affect: Mood normal.        Behavior: Behavior normal.    Wt Readings from Last 3 Encounters:  08/27/21 85 lb (38.6 kg)  07/09/21 84 lb (38.1 kg)  05/04/21 83 lb (37.6 kg)    BP 118/70   Pulse (!) 106   Temp 97.9 F (36.6 C) (Oral)   Ht 5' 5"  (1.651 m)   Wt 85 lb (38.6 kg)   SpO2 92%   BMI 14.14 kg/m   Assessment and Plan: 1. Acute exacerbation of chronic obstructive pulmonary disease (COPD) (HCC) Recommend using nebulizer albuterol bid Continue Albuterol MDI and Atrovent MDI bid - predniSONE (DELTASONE) 10 MG tablet; Take 1 tablet (10 mg total) by mouth as directed for 6 days. Take 6,5,4,3,2,1 then stop  Dispense: 21 tablet; Refill: 0 - amoxicillin-clavulanate (AUGMENTIN) 875-125 MG tablet; Take 1 tablet by mouth 2 (two) times daily for 10 days.  Dispense: 20 tablet; Refill: 0 - guaiFENesin-codeine (ROBITUSSIN AC) 100-10 MG/5ML syrup; Take 5 mLs by mouth 3 (three) times daily as needed for cough.  Dispense: 115 mL; Refill: 0  2. NSCLC of left lung ([place]) Chest CT was scheduled but pt did not show - she states she did not get the message. Will request it to be rescheduled and patient called on her home number. Applaud her plans to quit smoking in the near future in memory of her father.  3. Nail deformity Due to cuticle inflammation. Use cortisone cream bid and hopefully the nail will grow out  normally   Partially dictated using Editor, commissioning. Any errors are unintentional.  Halina Maidens, MD [facility]  08/27/2021

## 2021-08-27 NOTE — Patient Instructions (Addendum)
Use Nebulizer treatment twice a day.  Use cortisone cream to thumb cuticle twice a day.

## 2021-08-30 ENCOUNTER — Other Ambulatory Visit: Payer: Self-pay | Admitting: Internal Medicine

## 2021-08-30 DIAGNOSIS — R918 Other nonspecific abnormal finding of lung field: Secondary | ICD-10-CM

## 2021-08-31 ENCOUNTER — Ambulatory Visit: Payer: Self-pay | Admitting: *Deleted

## 2021-08-31 NOTE — Telephone Encounter (Signed)
Summary: yeast infection from antibiotics   Pt stated she may have a yeast infection from antibiotics prescribed by Dr. Army Melia on Aug 27, 2021.   Pt stated that she was experiencing itching.   Pt is requesting medication and seeking clinical advice.      Chief Complaint: Vaginal Itching Symptoms: Vaginal itching after course of Antibiotics Frequency: Last night Pertinent Negatives: Patient denies  any discharge "Itching is more inside vagina" Denies pain, abdominal pain. Disposition: [] ED /[] Urgent Care (no appt availability in office) / [] Appointment(In office/virtual)/ []  [place] [facility]/ [x] Home Care/ [] Refused Recommended Disposition /[] [place] [facility]/ []  Follow-up with PCP Additional Notes: Pt requesting something called in for yeast infection. Home care advised provided, pt verbalizes understanding. Advised if PCP would like to call in RX would hear back from practice. Pt verbalizes understanding. After hours call.  Reason for Disposition  [1] Symptoms of a yeast infection (i.e., itchy, white discharge, not bad smelling) AND [2] feels like prior vaginal yeast infections  Answer Assessment - Initial Assessment Questions 1. SYMPTOM: "What's the main symptom you're concerned about?" (e.g., pain, itching, dryness)     Itching 2. LOCATION: "Where is the  *No Answer* located?" (e.g., inside/outside, left/right)     More inside vagina 3. ONSET: "When did the  *No Answer*  start?"     Last night 4. PAIN: "Is there any pain?" If Yes, ask: "How bad is it?" (Scale: 1-10; mild, moderate, severe)     no 5. ITCHING: "Is there any itching?" If Yes, ask: "How bad is it?" (Scale: 1-10; mild, moderate, severe)     moderate 6. CAUSE: "What do you think is causing the discharge?" "Have you had the same problem before? What happened then?"     Yes with antibiotics 7. OTHER SYMPTOMS: "Do you have any other symptoms?" (e.g., fever, itching, vaginal bleeding, pain with urination,  injury to genital area, vaginal foreign body)     no  Protocols used: Vaginal Symptoms-A-AH

## 2021-09-01 ENCOUNTER — Other Ambulatory Visit: Payer: Self-pay

## 2021-09-01 ENCOUNTER — Ambulatory Visit: Payer: Medicaid Other

## 2021-09-01 DIAGNOSIS — B379 Candidiasis, unspecified: Secondary | ICD-10-CM

## 2021-09-01 MED ORDER — FLUCONAZOLE 150 MG PO TABS: 150.0000 mg | ORAL_TABLET | Freq: Once | ORAL | 0 refills | Status: AC

## 2021-09-01 NOTE — Telephone Encounter (Signed)
Called pt left VM to call back. Diflucan sent to [facility] in [place]. If the patient is still having symptoms after taking medication she will need to come into the office for a visit.  KP

## 2021-09-01 NOTE — Telephone Encounter (Signed)
Patient called on both numbers listed, left VM on both to return the call to speak to a nurse.

## 2021-09-02 ENCOUNTER — Other Ambulatory Visit: Payer: Self-pay

## 2021-09-02 ENCOUNTER — Ambulatory Visit: Payer: Self-pay | Admitting: *Deleted

## 2021-09-02 MED ORDER — FLUCONAZOLE 200 MG PO TABS: 200.0000 mg | ORAL_TABLET | Freq: Every day | ORAL | 1 refills | Status: DC

## 2021-09-02 NOTE — Telephone Encounter (Signed)
Pt returned call. Noted Diflucan had expired. Please resend.

## 2021-09-02 NOTE — Telephone Encounter (Signed)
Attempted to call patient with office response- no answer- left message to call office.

## 2021-09-02 NOTE — Telephone Encounter (Signed)
Sent in diflucan for yeast infection. - Raelin Pixler

## 2021-09-08 ENCOUNTER — Ambulatory Visit: Payer: Self-pay | Admitting: *Deleted

## 2021-09-08 NOTE — Telephone Encounter (Signed)
Per agent: "Pt stated she still is not well and requests that a nurse call her back in regards to her possibly being prescribed another antibiotic. Pt requests call back to advise. Cb# [phone]"    Chief Complaint: Cough Symptoms: States "No better, not worse but not any better." Seen 08/27/21. States completed antibiotics and prednisone Mon or Tues.  Frequency:  Pertinent Negatives: Patient denies fever Disposition: [] ED /[] Urgent Care (no appt availability in office) / [] Appointment(In office/virtual)/ []  [place] [facility]/ [] Home Care/ [] Refused Recommended Disposition /[] [place] [facility]/ [x]  Follow-up with PCP Additional Notes: Pt requesting another round of antibiotics or addition prednisone, Aware made need to be seen. States she has CT chest scheduled for Friday. States may need to be cancelled if Dr. Army Melia thinks appropriate. Advised ED for worsening symptoms. Please advise, after hours call.    Reason for Disposition  Cough has been present for > 3 weeks  Answer Assessment - Initial Assessment Questions 1. ONSET: "When did the cough begin?"      Seen 08/27/21 2. SEVERITY: "How bad is the cough today?"      Severe 3. SPUTUM: "Describe the color of your sputum" (none, dry cough; clear, white, yellow, green)     Unsure 4. HEMOPTYSIS: "Are you coughing up any blood?" If so ask: "How much?" (flecks, streaks, tablespoons, etc.)      5. DIFFICULTY BREATHING: "Are you having difficulty breathing?" If Yes, ask: "How bad is it?" (e.g., mild, moderate, severe)    - MILD: No SOB at rest, mild SOB with walking, speaks normally in sentences, can lie down, no retractions, pulse < 100.    - MODERATE: SOB at rest, SOB with minimal exertion and prefers to sit, cannot lie down flat, speaks in phrases, mild retractions, audible wheezing, pulse 100-120.    - SEVERE: Very SOB at rest, speaks in single words, struggling to breathe, sitting hunched forward, retractions, pulse > 120        6. FEVER: "Do you have a fever?" If Yes, ask: "What is your temperature, how was it measured, and when did it start?"     no  10. OTHER SYMPTOMS: "Do you have any other symptoms?" (e.g., runny nose, wheezing, chest pain)       Wheezing at times, SOB. Not new. Fatigued  Protocols used: Cough - Acute Productive-A-AH

## 2021-09-09 ENCOUNTER — Ambulatory Visit: Payer: Self-pay | Admitting: *Deleted

## 2021-09-09 DIAGNOSIS — Z419 Encounter for procedure for purposes other than remedying health state, unspecified: Secondary | ICD-10-CM | POA: Diagnosis not present

## 2021-09-09 NOTE — Telephone Encounter (Signed)
Pt stated she is not any worse but she is not any better. Pt is still coughing up thick mucous, still short of breath and wheezing. Pt does think she needs the CT scan and wants to cancel it for now and get it done when she is cleared up. Pt states she has transportation problems and she cant get to the office for a follow up appointment.  KP

## 2021-09-09 NOTE — Telephone Encounter (Signed)
Noted.  Duplicate message. Pt refused UC and ED due to transportation.  KP

## 2021-09-09 NOTE — Telephone Encounter (Signed)
Gave pt number to call and cancel CT [phone].  KP

## 2021-09-09 NOTE — Telephone Encounter (Signed)
  Chief Complaint: continued shortness of breath since seen on 08/27/21. Symptoms: cough , thick unknown color of mucus. Shortness of breath with exertion. Requesting medication  Frequency: since 08/27/21 Pertinent Negatives: Patient denies chest pain no fever Disposition: [] ED /[x] Urgent Care (no appt availability in office) / [] Appointment(In office/virtual)/ []  [place] [facility]/ [] Home Care/ [x] Refused Recommended Disposition /[] [place] [facility]/ []  Follow-up with PCP Additional Notes:   Requesting a call from PCP did not want to schedule appt due to no transportation to come in. Requesting to cancel CT if PCP advises. FC,  Amanda notified patient declined UC/ED.   Reason for Disposition  [1] MILD difficulty breathing (e.g., minimal/no SOB at rest, SOB with walking, pulse <100) AND [2] NEW-onset or WORSE than normal  Answer Assessment - Initial Assessment Questions 1. RESPIRATORY STATUS: "Describe your breathing?" (e.g., wheezing, shortness of breath, unable to speak, severe coughing)      Shortness of breath  2. ONSET: "When did this breathing problem begin?"      Since 08/27/21 3. PATTERN "Does the difficult breathing come and go, or has it been constant since it started?"      Comes and goes with exertion 4. SEVERITY: "How bad is your breathing?" (e.g., mild, moderate, severe)    - MILD: No SOB at rest, mild SOB with walking, speaks normally in sentences, can lie down, no retractions, pulse < 100.    - MODERATE: SOB at rest, SOB with minimal exertion and prefers to sit, cannot lie down flat, speaks in phrases, mild retractions, audible wheezing, pulse 100-120.    - SEVERE: Very SOB at rest, speaks in single words, struggling to breathe, sitting hunched forward, retractions, pulse > 120      Moderate can not lay flat  5. RECURRENT SYMPTOM: "Have you had difficulty breathing before?" If Yes, ask: "When was the last time?" and "What happened that time?"      Yes  6. CARDIAC  HISTORY: "Do you have any history of heart disease?" (e.g., heart attack, angina, bypass surgery, angioplasty)      See hx  7. LUNG HISTORY: "Do you have any history of lung disease?"  (e.g., pulmonary embolus, asthma, emphysema)     Hx pnm 8. CAUSE: "What do you think is causing the breathing problem?"      Not  9. OTHER SYMPTOMS: "Do you have any other symptoms? (e.g., dizziness, runny nose, cough, chest pain, fever)     Cough shortness of breath  10. O2 SATURATION MONITOR:  "Do you use an oxygen saturation monitor (pulse oximeter) at home?" If Yes, "What is your reading (oxygen level) today?" "What is your usual oxygen saturation reading?" (e.g., 95%)       na 11. PREGNANCY: "Is there any chance you are pregnant?" "When was your last menstrual period?"       na 12. TRAVEL: "Have you traveled out of the country in the last month?" (e.g., travel history, exposures)       na  Protocols used: Breathing Difficulty-A-AH

## 2021-09-09 NOTE — Telephone Encounter (Signed)
Called Kendra Mullins left VM to call back. Please read Kendra Mullins above message from Dr. Army Melia.  KP

## 2021-09-09 NOTE — Telephone Encounter (Signed)
Please review.  KP

## 2021-09-10 ENCOUNTER — Ambulatory Visit: Payer: Medicaid Other

## 2021-09-10 ENCOUNTER — Other Ambulatory Visit: Payer: Self-pay | Admitting: Internal Medicine

## 2021-09-10 DIAGNOSIS — J441 Chronic obstructive pulmonary disease with (acute) exacerbation: Secondary | ICD-10-CM

## 2021-09-10 MED ORDER — PREDNISONE 10 MG PO TABS: 10.0000 mg | ORAL_TABLET | ORAL | 0 refills | Status: AC

## 2021-09-10 MED ORDER — DOXYCYCLINE HYCLATE 100 MG PO TABS: 100.0000 mg | ORAL_TABLET | Freq: Two times a day (BID) | ORAL | 0 refills | Status: AC

## 2021-09-10 NOTE — Telephone Encounter (Signed)
Informed pt that medication was sent to pharmacy. Pt verbalized understanding.  KP

## 2021-10-09 DIAGNOSIS — Z419 Encounter for procedure for purposes other than remedying health state, unspecified: Secondary | ICD-10-CM | POA: Diagnosis not present

## 2021-10-21 ENCOUNTER — Telehealth: Payer: Self-pay | Admitting: Internal Medicine

## 2021-10-21 NOTE — Telephone Encounter (Signed)
Called patient to inform them of CT orders are in she will need to call [place] and schedule a time that works best for her. When patient returns call, triage nurse may disclose results. CRM created

## 2021-10-21 NOTE — Telephone Encounter (Signed)
Copied from [place] [id number]. Topic: General - Other >> Oct 20, 2021  4:29 PM Everette C wrote: Reason for CRM: The patient has called to request new orders for a CT chest scan  Please contact the patient further if needed

## 2021-10-25 ENCOUNTER — Telehealth: Payer: Self-pay | Admitting: Internal Medicine

## 2021-10-25 NOTE — Telephone Encounter (Signed)
Copied from [place] [phone]. Topic: General - Other >> Oct 25, 2021  2:20 PM Chapman Fitch wrote: Reason for CRM: Pt missed her CT scan and needs a new request for CT scan for her chest / please advise

## 2021-10-25 NOTE — Telephone Encounter (Signed)
Order is in from [facility], I have spoke with them they have the order, pr needs to call an schedule appointment. Pt is aware. -Rashika Bettes

## 2021-10-28 ENCOUNTER — Telehealth: Payer: Self-pay | Admitting: Internal Medicine

## 2021-10-28 NOTE — Telephone Encounter (Signed)
Called pt told her to cal and schedule an appt that there is an active order that was ordered 08/2021. Told pt to call [phone]. Pt verbalized understanding.  KP

## 2021-10-28 NOTE — Telephone Encounter (Signed)
Copied from [place] [phone]. Topic: General - Other >> Oct 28, 2021  2:50 PM Everette C wrote: Reason for CRM: The patient has called to follow up on their previous reuqest for orders for a CT scan originally requested on 10/25/21  The patient has been in contact with the office that will be doing the imaging and has been told that no orders have been received   Please contact further

## 2021-11-08 ENCOUNTER — Telehealth: Payer: Self-pay | Admitting: Internal Medicine

## 2021-11-08 NOTE — Telephone Encounter (Signed)
Copied from [place] [phone]. Topic: General - Other >> Nov 08, 2021  3:31 PM Leilani Able wrote: Colette from [facility] has called re pt, pt had had to cancel and has been trying to resch this and caller, Eldridge Abrahams states that it needs another order from Dr B pls forward another CT-chest /W/O sch [facility] as before

## 2021-11-09 ENCOUNTER — Other Ambulatory Visit: Payer: Self-pay

## 2021-11-09 ENCOUNTER — Other Ambulatory Visit: Payer: Self-pay | Admitting: Internal Medicine

## 2021-11-09 DIAGNOSIS — R918 Other nonspecific abnormal finding of lung field: Secondary | ICD-10-CM

## 2021-11-09 DIAGNOSIS — Z419 Encounter for procedure for purposes other than remedying health state, unspecified: Secondary | ICD-10-CM | POA: Diagnosis not present

## 2021-11-09 NOTE — Telephone Encounter (Signed)
Ordered  KP

## 2021-11-10 ENCOUNTER — Telehealth: Payer: Self-pay | Admitting: Internal Medicine

## 2021-11-10 ENCOUNTER — Other Ambulatory Visit: Payer: Self-pay | Admitting: Internal Medicine

## 2021-11-10 DIAGNOSIS — J438 Other emphysema: Secondary | ICD-10-CM

## 2021-11-10 NOTE — Telephone Encounter (Signed)
Copied from [place] [phone]. Topic: General - Other >> Nov 09, 2021  4:58 PM [place] B wrote: Reason for BDH:DIXBOER called in, needs chest xray scheduled. I let her know the order was just put in today.

## 2021-11-10 NOTE — Telephone Encounter (Signed)
Noted  KP 

## 2021-11-10 NOTE — Telephone Encounter (Signed)
Copied from [place] [phone]. Topic: General - Other >> Nov 09, 2021  4:58 PM [place] B wrote: Reason for UKR:CVKFMMC called in, needs chest xray scheduled. I let her know the order was just put in today.

## 2021-11-11 NOTE — Telephone Encounter (Signed)
Not an active med Dc'd 01/29/20 Dr Army Melia  Requested Prescriptions  Pending Prescriptions Disp Refills   albuterol (VENTOLIN HFA) 108 (90 Base) MCG/ACT inhaler [Pharmacy Med Name: ALBUTEROL HFA INH(200 PUFFS) 18GM] 18 g     Sig: INHALE 2 PUFFS INTO THE LUNGS EVERY 6 HOURS AS NEEDED FOR WHEEZING OR SHORTNESS OF BREATH.     Pulmonology:  Beta Agonists 2 Passed - 11/10/2021  9:25 AM      Passed - Last BP in normal range    BP Readings from Last 1 Encounters:  08/27/21 118/70         Passed - Last Heart Rate in normal range    Pulse Readings from Last 1 Encounters:  08/27/21 (!) 106         Passed - Valid encounter within last 12 months    Recent Outpatient Visits           2 months ago Acute exacerbation of chronic obstructive pulmonary disease (COPD) [facility])   [facility] Glean Hess, MD   4 months ago NSCLC of left lung [facility])   [facility] Glean Hess, MD   6 months ago Infective urethritis   [facility] Glean Hess, MD   10 months ago Essential hypertension   [place], [place]   11 months ago COVID-28 virus infection   [facility] Glean Hess, MD       Future Appointments             In 2 months Army Melia, Jesse Sans, MD [facility], [facility]

## 2021-11-22 ENCOUNTER — Other Ambulatory Visit: Payer: Self-pay | Admitting: Internal Medicine

## 2021-11-22 DIAGNOSIS — J438 Other emphysema: Secondary | ICD-10-CM

## 2021-11-22 NOTE — Telephone Encounter (Signed)
Requested Prescriptions  Pending Prescriptions Disp Refills  . ATROVENT HFA 17 MCG/ACT inhaler [Pharmacy Med Name: ATROVENT HFA ORAL INHALER (200 INH)] 12.9 g 4    Sig: INHALE 2 PUFFS BY MOUTH EVERY 6 HOURS AS NEEDED FOR WHEEZING     Pulmonology:  Anticholinergic Agents Passed - 11/22/2021  3:34 AM      Passed - Valid encounter within last 12 months    Recent Outpatient Visits          2 months ago Acute exacerbation of chronic obstructive pulmonary disease (COPD) [facility])   [facility] Glean Hess, MD   4 months ago NSCLC of left lung [facility])   [facility] Glean Hess, MD   6 months ago Infective urethritis   [facility] Glean Hess, MD   10 months ago Essential hypertension   [place], MD   11 months ago COVID-19 virus infection   [facility] Glean Hess, MD      Future Appointments            In 1 month Army Melia, Jesse Sans, MD [facility], [facility]

## 2021-12-10 DIAGNOSIS — F331 Major depressive disorder, recurrent, moderate: Secondary | ICD-10-CM | POA: Diagnosis not present

## 2021-12-10 DIAGNOSIS — Z419 Encounter for procedure for purposes other than remedying health state, unspecified: Secondary | ICD-10-CM | POA: Diagnosis not present

## 2021-12-20 ENCOUNTER — Other Ambulatory Visit: Payer: Self-pay | Admitting: Internal Medicine

## 2021-12-20 DIAGNOSIS — I10 Essential (primary) hypertension: Secondary | ICD-10-CM

## 2021-12-22 NOTE — Telephone Encounter (Signed)
Requested Prescriptions  Pending Prescriptions Disp Refills  . VENTOLIN HFA 108 (90 Base) MCG/ACT inhaler [Pharmacy Med Name: VENTOLIN HFA INH W/DOS CTR 200PUFFS] 18 g 0    Sig: INHALE 2 PUFFS INTO THE LUNGS EVERY 6 HOURS AS NEEDED FOR WHEEZING OR SHORTNESS OF BREATH     Pulmonology:  Beta Agonists 2 Passed - 12/20/2021  9:28 PM      Passed - Last BP in normal range    BP Readings from Last 1 Encounters:  08/27/21 118/70         Passed - Last Heart Rate in normal range    Pulse Readings from Last 1 Encounters:  08/27/21 (!) 106         Passed - Valid encounter within last 12 months    Recent Outpatient Visits          3 months ago Acute exacerbation of chronic obstructive pulmonary disease (COPD) ([place])   [place] [facility] and [facility] Medicine at [facility], Jesse Sans, MD   5 months ago NSCLC of left lung [facility])   [place] [facility] Care and Sports Medicine at [facility], Jesse Sans, MD   7 months ago Infective urethritis   [place] [facility] and Sports Medicine at [facility], Jesse Sans, MD   11 months ago Essential hypertension   [place] [facility] Care and Sports Medicine at [facility], Jesse Sans, MD   1 year ago COVID-19 virus infection   [place] Primary Care and Sports Medicine at [facility], Jesse Sans, MD      Future Appointments            In 3 weeks Army Melia Jesse Sans, MD [facility] Primary Care and Sports Medicine at [facility], [facility]           . diltiazem [facility]) 240 MG 24 hr capsule [Pharmacy Med Name: DILTIAZEM ER 240MG  CAPSULES (24 HR)] 90 capsule 0    Sig: TAKE 1 CAPSULE(240 MG) BY MOUTH DAILY     Cardiovascular: Calcium Channel Blockers 3 Passed - 12/20/2021  9:28 PM      Passed - ALT in normal range and within 360 days    ALT  Date Value Ref Range Status  12/30/2020 24 0 - 32 IU/L Final         Passed - AST in normal range and within 360 days    AST  Date Value Ref  Range Status  12/30/2020 30 0 - 40 IU/L Final         Passed - Cr in normal range and within 360 days    Creatinine, Ser  Date Value Ref Range Status  12/30/2020 0.58 0.57 - 1.00 mg/dL Final         Passed - Last BP in normal range    BP Readings from Last 1 Encounters:  08/27/21 118/70         Passed - Last Heart Rate in normal range    Pulse Readings from Last 1 Encounters:  08/27/21 (!) 106         Passed - Valid encounter within last 6 months    Recent Outpatient Visits          3 months ago Acute exacerbation of chronic obstructive pulmonary disease (COPD) ([place])   [place] [facility] and Sports Medicine at [facility], Jesse Sans, MD   5 months ago NSCLC of left lung [facility])   [place]  Primary Care and Sports Medicine at [facility], Jesse Sans, MD   7 months ago Infective urethritis   [place] Primary Care and Sports Medicine at [facility], Jesse Sans, MD   11 months ago Essential hypertension    Primary Care and Sports Medicine at [facility], Jesse Sans, MD   1 year ago COVID-19 virus infection   [facility] and [facility] at [facility], Jesse Sans, MD      Future Appointments            In 3 weeks Army Melia Jesse Sans, MD [facility] Primary Care and Sports Medicine at [facility], [facility]

## 2021-12-30 ENCOUNTER — Ambulatory Visit
Admission: RE | Admit: 2021-12-30 | Discharge: 2021-12-30 | Disposition: A | Discharge status: Home / Self Care | Payer: Medicaid Other | Source: Ambulatory Visit | Attending: Internal Medicine | Admitting: Internal Medicine

## 2021-12-30 DIAGNOSIS — J439 Emphysema, unspecified: Secondary | ICD-10-CM | POA: Diagnosis not present

## 2021-12-30 DIAGNOSIS — R918 Other nonspecific abnormal finding of lung field: Secondary | ICD-10-CM | POA: Insufficient documentation

## 2021-12-30 DIAGNOSIS — R911 Solitary pulmonary nodule: Secondary | ICD-10-CM | POA: Diagnosis not present

## 2022-01-03 ENCOUNTER — Telehealth: Payer: Self-pay | Admitting: *Deleted

## 2022-01-03 NOTE — Telephone Encounter (Signed)
[facility]   Calling to inform office results are in chart:   IMPRESSION: 1. New, spiculated nodule of the anterior left lower lobe measuring 1.0 x 0.8 cm, highly concerning for primary lung malignancy. 2. Nearly complete resolution of a previously seen anterior left upper lobe pulmonary nodule, now with only an irregular residual consistent with resolution of nonspecific infection inflammation. 3. Emphysema and diffuse bilateral bronchial wall thickening.

## 2022-01-04 ENCOUNTER — Other Ambulatory Visit: Payer: Self-pay | Admitting: Internal Medicine

## 2022-01-04 DIAGNOSIS — R911 Solitary pulmonary nodule: Secondary | ICD-10-CM

## 2022-01-04 NOTE — Telephone Encounter (Signed)
Pt states she is returning a call regarding her ct results  Please fu w/ pt

## 2022-01-07 ENCOUNTER — Other Ambulatory Visit: Payer: Self-pay | Admitting: Internal Medicine

## 2022-01-07 NOTE — Telephone Encounter (Signed)
Requested Prescriptions  Pending Prescriptions Disp Refills  . fluticasone (FLONASE) 50 MCG/ACT nasal spray [Pharmacy Med Name: FLUTICASONE 50MCG NASAL SP (120) RX] 48 g 0    Sig: SHAKE LIQUID AND USE 2 SPRAYS IN EACH NOSTRIL DAILY     Ear, Nose, and Throat: Nasal Preparations - Corticosteroids Passed - 01/07/2022  3:35 AM      Passed - Valid encounter within last 12 months    Recent Outpatient Visits          4 months ago Acute exacerbation of chronic obstructive pulmonary disease (COPD) ([place])   [place] [facility] and [facility] Medicine at [facility], Jesse Sans, MD   6 months ago NSCLC of left lung [facility])   [place] Primary Care and Sports Medicine at [facility], Jesse Sans, MD   8 months ago Infective urethritis   [place] [facility] Care and Sports Medicine at [facility], Jesse Sans, MD   1 year ago Essential hypertension   [place] Primary Care and Sports Medicine at [facility], Jesse Sans, MD   1 year ago COVID-19 virus infection   [facility] Primary [facility] and [facility] at [facility], Jesse Sans, MD      Future Appointments            In 1 week Army Melia, Jesse Sans, MD [place] Primary Care and Sports Medicine at [facility], [facility]

## 2022-01-09 ENCOUNTER — Encounter: Payer: Self-pay | Admitting: Internal Medicine

## 2022-01-09 ENCOUNTER — Other Ambulatory Visit: Payer: Self-pay | Admitting: Internal Medicine

## 2022-01-09 DIAGNOSIS — Z419 Encounter for procedure for purposes other than remedying health state, unspecified: Secondary | ICD-10-CM | POA: Diagnosis not present

## 2022-01-10 ENCOUNTER — Other Ambulatory Visit: Payer: Self-pay | Admitting: Internal Medicine

## 2022-01-10 DIAGNOSIS — I700 Atherosclerosis of aorta: Secondary | ICD-10-CM

## 2022-01-10 NOTE — Telephone Encounter (Signed)
Requested medication (s) are due for refill today: yes  Requested medication (s) are on the active medication list: yes  Last refill:  06/04/21 #30 5 RF  Future visit scheduled: yes  Notes to clinic:  overdue lipid panel-    Requested Prescriptions  Pending Prescriptions Disp Refills   atorvastatin (LIPITOR) 40 MG tablet [Pharmacy Med Name: ATORVASTATIN 40MG  TABLETS] 30 tablet 5    Sig: TAKE 1 TABLET(40 MG) BY MOUTH DAILY     Cardiovascular:  Antilipid - Statins Failed - 01/10/2022  3:38 AM      Failed - Lipid Panel in normal range within the last 12 months    Cholesterol, Total  Date Value Ref Range Status  12/30/2020 187 100 - 199 mg/dL Final   LDL Chol Calc (NIH)  Date Value Ref Range Status  12/30/2020 65 0 - 99 mg/dL Final   HDL  Date Value Ref Range Status  12/30/2020 110 >39 mg/dL Final   Triglycerides  Date Value Ref Range Status  12/30/2020 65 0 - 149 mg/dL Final         Passed - Patient is not pregnant      Passed - Valid encounter within last 12 months    Recent Outpatient Visits           4 months ago Acute exacerbation of chronic obstructive pulmonary disease (COPD) ([place])   [place] Primary Care and Sports Medicine at [facility], Jesse Sans, MD   6 months ago NSCLC of left lung [facility])   [place] Primary Care and Sports Medicine at [facility], Jesse Sans, MD   8 months ago Infective urethritis   [place] Primary Care and Sports Medicine at [facility], Jesse Sans, MD   1 year ago Essential hypertension   [place] [facility] Care and Sports Medicine at [facility], Jesse Sans, MD   1 year ago COVID-19 virus infection   [facility] Primary Care and Sports Medicine at [facility], Jesse Sans, MD       Future Appointments             In 4 days Army Melia Jesse Sans, MD [place] Primary Care and Sports Medicine at [facility], [facility]

## 2022-01-10 NOTE — Telephone Encounter (Signed)
Could not get a message from Parke Poisson, so resent message to [facility]. Awaiting response.

## 2022-01-10 NOTE — Telephone Encounter (Signed)
Sent referral coordinator Parke Poisson a message to check on referral since pt is not yet scheduled.  - Bela Nyborg

## 2022-01-10 NOTE — Telephone Encounter (Signed)
Requested Prescriptions  Pending Prescriptions Disp Refills  . VENTOLIN HFA 108 (90 Base) MCG/ACT inhaler [Pharmacy Med Name: VENTOLIN HFA INH W/DOS CTR 200PUFFS] 18 g 2    Sig: INHALE 2 PUFFS INTO THE LUNGS EVERY 6 HOURS AS NEEDED FOR WHEEZING OR SHORTNESS OF BREATH     Pulmonology:  Beta Agonists 2 Passed - 01/09/2022  2:36 PM      Passed - Last BP in normal range    BP Readings from Last 1 Encounters:  08/27/21 118/70         Passed - Last Heart Rate in normal range    Pulse Readings from Last 1 Encounters:  08/27/21 (!) 106         Passed - Valid encounter within last 12 months    Recent Outpatient Visits          4 months ago Acute exacerbation of chronic obstructive pulmonary disease (COPD) ([place])   [place] Primary Care and Sports Medicine at [place], Jesse Sans, MD   6 months ago NSCLC of left lung [facility])   [place] Primary Care and Sports Medicine at [facility], Jesse Sans, MD   8 months ago Infective urethritis   [place] Primary Care and Sports Medicine at [facility], Jesse Sans, MD   1 year ago Essential hypertension   [place] Primary Care and Sports Medicine at [facility], Jesse Sans, MD   1 year ago COVID-19 virus infection   [facility] Primary Care and Sports Medicine at [facility], Jesse Sans, MD      Future Appointments            In 4 days Army Melia Jesse Sans, MD [place] and Sports Medicine at [facility], [facility]

## 2022-01-14 ENCOUNTER — Other Ambulatory Visit: Payer: Self-pay | Admitting: Internal Medicine

## 2022-01-14 ENCOUNTER — Encounter: Payer: Self-pay | Admitting: Internal Medicine

## 2022-01-14 ENCOUNTER — Ambulatory Visit (INDEPENDENT_AMBULATORY_CARE_PROVIDER_SITE_OTHER): Payer: Medicaid Other | Admitting: Internal Medicine

## 2022-01-14 VITALS — BP 122/78 | HR 68 | Ht 65.0 in | Wt 84.0 lb

## 2022-01-14 DIAGNOSIS — J432 Centrilobular emphysema: Secondary | ICD-10-CM | POA: Diagnosis not present

## 2022-01-14 DIAGNOSIS — Z1211 Encounter for screening for malignant neoplasm of colon: Secondary | ICD-10-CM

## 2022-01-14 DIAGNOSIS — I10 Essential (primary) hypertension: Secondary | ICD-10-CM | POA: Diagnosis not present

## 2022-01-14 DIAGNOSIS — Z23 Encounter for immunization: Secondary | ICD-10-CM

## 2022-01-14 DIAGNOSIS — R918 Other nonspecific abnormal finding of lung field: Secondary | ICD-10-CM | POA: Diagnosis not present

## 2022-01-14 DIAGNOSIS — I700 Atherosclerosis of aorta: Secondary | ICD-10-CM

## 2022-01-14 DIAGNOSIS — F17200 Nicotine dependence, unspecified, uncomplicated: Secondary | ICD-10-CM

## 2022-01-14 DIAGNOSIS — Z1231 Encounter for screening mammogram for malignant neoplasm of breast: Secondary | ICD-10-CM

## 2022-01-14 DIAGNOSIS — Z0000 Encounter for general adult medical examination without abnormal findings: Secondary | ICD-10-CM | POA: Diagnosis not present

## 2022-01-14 MED ORDER — BENAZEPRIL HCL 5 MG PO TABS: 5.0000 mg | ORAL_TABLET | Freq: Every day | ORAL | 1 refills | Status: DC

## 2022-01-14 MED ORDER — VARENICLINE TARTRATE 0.5 MG PO TABS: 0.5000 mg | ORAL_TABLET | Freq: Two times a day (BID) | ORAL | 0 refills | Status: DC

## 2022-01-14 NOTE — Progress Notes (Signed)
Date:  01/14/2022   Name:  Kendra Mullins   DOB:  Dec 26, 1960   MRN:  [id number]   Chief Complaint: Annual Exam and Nicotine Dependence Kendra Mullins is a 61 y.o. female who presents today for her Complete Annual Exam. She feels fairly well. She reports exercising none. She reports she is sleeping well. Breast complaints - none.  She is still smoking but dedicated to quitting and is interested in trying chantix.  Mammogram: none DEXA: none Pap smear: discontinued Colonoscopy: none  Health Maintenance Due  Topic Date Due   COVID-19 Vaccine (1) Never done   COLONOSCOPY (Pts 45-57yr Insurance coverage will need to be confirmed)  Never done   MAMMOGRAM  Never done   INFLUENZA VACCINE  11/09/2021    Immunization History  Administered Date(s) Administered   Influenza, Seasonal, Injecte, Preservative Fre 02/18/2011, 04/25/2012   Influenza,inj,Quad PF,6+ Mos 02/12/2013, 02/14/2014, 02/13/2015, 04/26/2016, 03/09/2017, 04/09/2018, 01/06/2020   Influenza-Unspecified 02/09/2017   Pneumococcal Conjugate-13 12/06/2017   Pneumococcal Polysaccharide-23 08/31/2010   Pneumococcal-Unspecified 08/31/2010   Tdap 07/15/2019   Zoster Recombinat (Shingrix) 09/09/2020, 12/30/2020    Hypertension This is a chronic problem. Pertinent negatives include no chest pain, headaches, palpitations or shortness of breath. Past treatments include ACE inhibitors and calcium channel blockers. There is no history of kidney disease, CAD/MI or CVA.  Hyperlipidemia This is a chronic problem. The problem is controlled. Pertinent negatives include no chest pain or shortness of breath. Current antihyperlipidemic treatment includes statins.    Lab Results  Component Value Date   NA 136 12/30/2020   K 4.3 12/30/2020   CO2 22 12/30/2020   GLUCOSE 93 12/30/2020   BUN 9 12/30/2020   CREATININE 0.58 12/30/2020   CALCIUM 9.7 12/30/2020   EGFR 104 12/30/2020   GFRNONAA 102 10/25/2019   Lab Results  Component  Value Date   CHOL 187 12/30/2020   HDL 110 12/30/2020   LDLCALC 65 12/30/2020   TRIG 65 12/30/2020   CHOLHDL 1.7 12/30/2020   Lab Results  Component Value Date   TSH 2.120 12/30/2020   No results found for: "HGBA1C" Lab Results  Component Value Date   WBC 10.1 12/30/2020   HGB 13.4 12/30/2020   HCT 38.7 12/30/2020   MCV 94 12/30/2020   PLT 374 12/30/2020   Lab Results  Component Value Date   ALT 24 12/30/2020   AST 30 12/30/2020   ALKPHOS 126 (H) 12/30/2020   BILITOT 0.4 12/30/2020   Lab Results  Component Value Date   VD25OH 17.7 (L) 05/04/2021     Review of Systems  Constitutional:  Negative for chills, fatigue and fever.  HENT:  Negative for congestion, hearing loss, tinnitus, trouble swallowing and voice change.   Eyes:  Negative for visual disturbance.  Respiratory:  Negative for cough, chest tightness, shortness of breath and wheezing.   Cardiovascular:  Negative for chest pain, palpitations and leg swelling.  Gastrointestinal:  Negative for abdominal pain, constipation, diarrhea and vomiting.  Endocrine: Negative for polydipsia and polyuria.  Genitourinary:  Negative for dysuria, frequency, genital sores, vaginal bleeding and vaginal discharge.  Musculoskeletal:  Negative for arthralgias, gait problem and joint swelling.  Skin:  Negative for color change and rash.  Neurological:  Negative for dizziness, tremors, light-headedness and headaches.  Hematological:  Negative for adenopathy. Does not bruise/bleed easily.  Psychiatric/Behavioral:  Negative for dysphoric mood and sleep disturbance. The patient is not nervous/anxious.     Patient Active Problem List   Diagnosis  Date Noted   S/P total hysterectomy 05/19/2020   NSCLC of left lung ([place]) 01/29/2020   Closed fracture of head of left humerus 05/15/2019   Aortic atherosclerosis ([place]) 12/31/2017   TIA (transient ischemic attack) 06/25/2017   Localized primary osteoarthritis of hands, bilateral 05/02/2017    Continuous chronic alcoholism ([place]) 09/15/2014   Anxiety and depression 09/15/2014   Tobacco use disorder 09/15/2014   Essential hypertension 09/15/2014   Hot flash, menopausal 09/15/2014   MI (mitral incompetence) 09/15/2014   Centrilobular emphysema ([place]) 09/15/2014   Low weight 09/15/2014    Allergies  Allergen Reactions   Azithromycin Nausea Only   Codeine Nausea And Vomiting    Past Surgical History:  Procedure Laterality Date   TOTAL ABDOMINAL HYSTERECTOMY  1986   one ovary remains    Social History   Tobacco Use   Smoking status: Every Day    Packs/day: 0.50    Years: 40.00    Total pack years: 20.00    Types: Cigarettes   Smokeless tobacco: Never   Tobacco comments:    Planning to quit on father's day  Vaping Use   Vaping Use: Every day  Substance Use Topics   Alcohol use: Yes    Alcohol/week: 50.0 standard drinks of alcohol    Types: 50 Standard drinks or equivalent per week   Drug use: No     Medication list has been reviewed and updated.  Current Meds  Medication Sig   albuterol (PROVENTIL) (2.5 MG/3ML) 0.083% nebulizer solution Take 3 mLs (2.5 mg total) by nebulization every 6 (six) hours as needed for wheezing or shortness of breath.   ALPRAZolam (XANAX) 0.5 MG tablet Take 0.5 mg by mouth 2 (two) times daily as needed for anxiety.   ASPIRIN LOW DOSE 81 MG EC tablet TAKE 1 TABLET BY MOUTH ONCE DAILY   atorvastatin (LIPITOR) 40 MG tablet TAKE 1 TABLET(40 MG) BY MOUTH DAILY   ATROVENT HFA 17 MCG/ACT inhaler INHALE 2 PUFFS BY MOUTH EVERY 6 HOURS AS NEEDED FOR WHEEZING   benazepril (LOTENSIN) 5 MG tablet Take 1 tablet (5 mg total) by mouth daily.   diltiazem (TIAZAC) 240 MG 24 hr capsule TAKE 1 CAPSULE(240 MG) BY MOUTH DAILY   fluconazole (DIFLUCAN) 200 MG tablet Take 1 tablet (200 mg total) by mouth daily.   fluticasone (FLONASE) 50 MCG/ACT nasal spray SHAKE LIQUID AND USE 2 SPRAYS IN EACH NOSTRIL DAILY   Nebulizers (VIOS AEROSOL DELIVERY SYSTEM)  MISC See admin instructions.   sertraline (ZOLOFT) 50 MG tablet Take 50 mg by mouth daily. [name]   VENTOLIN HFA 108 (90 Base) MCG/ACT inhaler INHALE 2 PUFFS INTO THE LUNGS EVERY 6 HOURS AS NEEDED FOR WHEEZING OR SHORTNESS OF BREATH   [DISCONTINUED] Blood Pressure Monitoring (BLOOD PRESSURE MONITOR AUTOMAT) DEVI 1 each by Does not apply route daily at 6 (six) AM. Use to check BP daily.   [DISCONTINUED] nicotine (NICODERM CQ - DOSED IN MG/24 HOURS) 21 mg/24hr patch Place 1 patch (21 mg total) onto the skin daily.       08/27/2021    4:11 PM 07/09/2021    3:01 PM 05/04/2021    2:53 PM 12/30/2020    1:39 PM  GAD 7 : Generalized Anxiety Score  Nervous, Anxious, on Edge 1 1 1 1   Control/stop worrying 1 0 1 1  Worry too much - different things 1 1 1 1   Trouble relaxing 0 0 0 0  Restless 1 0 0 0  Easily annoyed  or irritable 0 1 0 0  Afraid - awful might happen 0 0 0 0  Total GAD 7 Score 4 3 3 3   Anxiety Difficulty Not difficult at all  Not difficult at all        08/27/2021    4:10 PM 07/09/2021    3:01 PM 05/04/2021    2:53 PM  Depression screen PHQ 2/9  Decreased Interest 2 1 1   Down, Depressed, Hopeless 2 1 1   PHQ - 2 Score 4 2 2   Altered sleeping 0 1 1  Tired, decreased energy 2 1 2   Change in appetite 0 0 2  Feeling bad or failure about yourself  0 0 0  Trouble concentrating 1 1 0  Moving slowly or fidgety/restless 1 0 0  Suicidal thoughts 0 0 0  PHQ-9 Score 8 5 7   Difficult doing work/chores Somewhat difficult Not difficult at all Not difficult at all    BP Readings from Last 3 Encounters:  01/14/22 122/78  08/27/21 118/70  07/09/21 124/66    Physical Exam Vitals and nursing note reviewed.  Constitutional:      General: She is not in acute distress.    Appearance: She is well-developed.  HENT:     Head: Normocephalic and atraumatic.     Right Ear: Tympanic membrane and ear canal normal.     Left Ear: Tympanic membrane and ear canal normal.     Nose:      Right Sinus: No maxillary sinus tenderness.     Left Sinus: No maxillary sinus tenderness.  Eyes:     General: No scleral icterus.       Right eye: No discharge.        Left eye: No discharge.     Conjunctiva/sclera: Conjunctivae normal.  Neck:     Thyroid: No thyromegaly.     Vascular: No carotid bruit.  Cardiovascular:     Rate and Rhythm: Normal rate and regular rhythm.     Pulses: Normal pulses.     Heart sounds: Normal heart sounds.  Pulmonary:     Effort: Pulmonary effort is normal. No respiratory distress.     Breath sounds: No wheezing.  Chest:  Breasts:    Right: No mass, nipple discharge, skin change or tenderness.     Left: No mass, nipple discharge, skin change or tenderness.  Abdominal:     General: Bowel sounds are normal.     Palpations: Abdomen is soft.     Tenderness: There is no abdominal tenderness.  Musculoskeletal:     Cervical back: Normal range of motion. No erythema.     Right lower leg: No edema.     Left lower leg: No edema.  Lymphadenopathy:     Cervical: No cervical adenopathy.  Skin:    General: Skin is warm and dry.     Capillary Refill: Capillary refill takes less than 2 seconds.     Findings: No rash.  Neurological:     General: No focal deficit present.     Mental Status: She is alert and oriented to person, place, and time.     Cranial Nerves: No cranial nerve deficit.     Sensory: No sensory deficit.     Deep Tendon Reflexes: Reflexes are normal and symmetric.  Psychiatric:        Attention and Perception: Attention normal.        Mood and Affect: Mood normal.     Wt Readings from Last 3 Encounters:  01/14/22 84 lb (38.1 kg)  08/27/21 85 lb (38.6 kg)  07/09/21 84 lb (38.1 kg)    BP 122/78   Pulse 68   Ht 5' 5"  (1.651 m)   Wt 84 lb (38.1 kg)   SpO2 97%   BMI 13.98 kg/m   Assessment and Plan: 1. Annual physical exam Normal exam except for weight. - CBC with Differential/Platelet - Comprehensive metabolic panel - Lipid  panel - TSH  2. Encounter for screening mammogram for breast cancer Recommend beginning annual screening. - MM 3D SCREEN BREAST BILATERAL  3. Colon cancer screening Will consider Colonoscopy once pulmonary evaluation is complete.  4. Essential hypertension Clinically stable exam with well controlled BP. Tolerating medications without side effects at this time. Pt to continue current regimen and low sodium diet; benefits of regular exercise as able discussed. - CBC with Differential/Platelet - Comprehensive metabolic panel - TSH - benazepril (LOTENSIN) 5 MG tablet; Take 1 tablet (5 mg total) by mouth daily.  Dispense: 90 tablet; Refill: 1  5. Centrilobular emphysema ([place]) Work on smoking cessation, including marijuana  6. Abnormal findings on diagnostic imaging of lung Spiculated 1 cm lesion in the left lung is worrisome for malignancy Has appointment with [facility] on 10/9  7. Aortic atherosclerosis ([facility]) Discussed lipids and would recommend medications if elevated. - Lipid panel  8. Tobacco use disorder - varenicline (CHANTIX) 0.5 MG tablet; Take 1 tablet (0.5 mg total) by mouth 2 (two) times daily.  Dispense: 60 tablet; Refill: 0   Partially dictated using Editor, commissioning. Any errors are unintentional.  Halina Maidens, MD [facility]  01/14/2022

## 2022-01-14 NOTE — Telephone Encounter (Signed)
Duplicate request, same pharm. Signed earlier today. Requested Prescriptions  Pending Prescriptions Disp Refills  . varenicline (CHANTIX) 0.5 MG tablet [Pharmacy Med Name: VARENICLINE 0.5MG  TABLETS] 180 tablet     Sig: TAKE 1 TABLET(0.5 MG) BY MOUTH TWICE DAILY     Psychiatry:  Drug Dependence Therapy - varenicline Failed - 01/14/2022  4:40 PM      Failed - Manual Review: Do not refill starter pack. 1 mg tabs may be extended up to one year if the patient has quit smoking but still feels at risk for relapse.      Failed - Cr in normal range and within 180 days    Creatinine, Ser  Date Value Ref Range Status  12/30/2020 0.58 0.57 - 1.00 mg/dL Final         Passed - Completed PHQ-2 or PHQ-9 in the last 360 days      Passed - Valid encounter within last 6 months    Recent Outpatient Visits          Today Annual physical exam   [place] [facility] Medicine at [facility], Jesse Sans, MD   4 months ago Acute exacerbation of chronic obstructive pulmonary disease (COPD) ([place])   [place] [facility] Care and Sports Medicine at [facility], Jesse Sans, MD   6 months ago NSCLC of left lung [facility])   [place] Primary Care and Sports Medicine at [facility], Jesse Sans, MD   8 months ago Infective urethritis   [place] [facility] Care and Sports Medicine at [facility], Jesse Sans, MD   1 year ago Essential hypertension    Primary Care and Sports Medicine at [facility], Jesse Sans, MD

## 2022-01-14 NOTE — Patient Instructions (Addendum)
Call [facility] to schedule your mammogram at [phone].  Take Vitamin D 1000 -2000 IU daily.  Call to confirm appointment: Berneice Gandy, MD Radiation Oncology NPI: [id number] [address] [place] [address]   Phone: [phone] Fax: [phone]

## 2022-01-15 LAB — COMPREHENSIVE METABOLIC PANEL
ALT: 30 IU/L (ref 0–32)
AST: 29 IU/L (ref 0–40)
Albumin/Globulin Ratio: 2.1 (ref 1.2–2.2)
Albumin: 4.6 g/dL (ref 3.9–4.9)
Alkaline Phosphatase: 125 IU/L — ABNORMAL HIGH (ref 44–121)
BUN/Creatinine Ratio: 16 (ref 12–28)
BUN: 10 mg/dL (ref 8–27)
Bilirubin Total: 0.3 mg/dL (ref 0.0–1.2)
CO2: 27 mmol/L (ref 20–29)
Calcium: 9.5 mg/dL (ref 8.7–10.3)
Chloride: 99 mmol/L (ref 96–106)
Creatinine, Ser: 0.62 mg/dL (ref 0.57–1.00)
Globulin, Total: 2.2 g/dL (ref 1.5–4.5)
Glucose: 91 mg/dL (ref 70–99)
Potassium: 4.1 mmol/L (ref 3.5–5.2)
Sodium: 137 mmol/L (ref 134–144)
Total Protein: 6.8 g/dL (ref 6.0–8.5)
eGFR: 101 mL/min/{1.73_m2} (ref >59)

## 2022-01-15 LAB — CBC WITH DIFFERENTIAL/PLATELET
Basophils Absolute: 0.1 10*3/uL (ref 0.0–0.2)
Basos: 1 %
EOS (ABSOLUTE): 0.1 10*3/uL (ref 0.0–0.4)
Eos: 2 %
Hematocrit: 37.1 % (ref 34.0–46.6)
Hemoglobin: 12.8 g/dL (ref 11.1–15.9)
Immature Grans (Abs): 0 10*3/uL (ref 0.0–0.1)
Immature Granulocytes: 0 %
Lymphocytes Absolute: 2.1 10*3/uL (ref 0.7–3.1)
Lymphs: 29 %
MCH: 32 pg (ref 26.6–33.0)
MCHC: 34.5 g/dL (ref 31.5–35.7)
MCV: 93 fL (ref 79–97)
Monocytes Absolute: 0.8 10*3/uL (ref 0.1–0.9)
Monocytes: 11 %
Neutrophils Absolute: 4 10*3/uL (ref 1.4–7.0)
Neutrophils: 57 %
Platelets: 335 10*3/uL (ref 150–450)
RBC: 4 x10E6/uL (ref 3.77–5.28)
RDW: 11.4 % — ABNORMAL LOW (ref 11.7–15.4)
WBC: 7.2 10*3/uL (ref 3.4–10.8)

## 2022-01-15 LAB — LIPID PANEL
Chol/HDL Ratio: 1.5 ratio (ref 0.0–4.4)
Cholesterol, Total: 166 mg/dL (ref 100–199)
HDL: 109 mg/dL (ref >39)
LDL Chol Calc (NIH): 48 mg/dL (ref 0–99)
Triglycerides: 35 mg/dL (ref 0–149)
VLDL Cholesterol Cal: 9 mg/dL (ref 5–40)

## 2022-01-15 LAB — TSH: TSH: 1.49 u[IU]/mL (ref 0.450–4.500)

## 2022-01-17 DIAGNOSIS — F17200 Nicotine dependence, unspecified, uncomplicated: Secondary | ICD-10-CM | POA: Diagnosis not present

## 2022-01-17 DIAGNOSIS — R911 Solitary pulmonary nodule: Secondary | ICD-10-CM | POA: Diagnosis not present

## 2022-01-17 DIAGNOSIS — C3412 Malignant neoplasm of upper lobe, left bronchus or lung: Secondary | ICD-10-CM | POA: Diagnosis not present

## 2022-02-08 ENCOUNTER — Other Ambulatory Visit: Payer: Self-pay | Admitting: Internal Medicine

## 2022-02-08 DIAGNOSIS — F17200 Nicotine dependence, unspecified, uncomplicated: Secondary | ICD-10-CM

## 2022-02-08 NOTE — Telephone Encounter (Signed)
Requested medications are due for refill today.  yes  Requested medications are on the active medications list.  yes  Last refill. 01/14/2022 #60 0 rf  Future visit scheduled.   no  Notes to clinic.  Manual review required.    Requested Prescriptions  Pending Prescriptions Disp Refills   varenicline (CHANTIX) 0.5 MG tablet [Pharmacy Med Name: VARENICLINE (APO) 0.5MG  TABLETS] 60 tablet 0    Sig: TAKE 1 TABLET(0.5 MG) BY MOUTH TWICE DAILY     Psychiatry:  Drug Dependence Therapy - varenicline Failed - 02/08/2022  3:14 PM      Failed - Manual Review: Do not refill starter pack. 1 mg tabs may be extended up to one year if the patient has quit smoking but still feels at risk for relapse.      Passed - Cr in normal range and within 180 days    Creatinine, Ser  Date Value Ref Range Status  01/14/2022 0.62 0.57 - 1.00 mg/dL Final         Passed - Completed PHQ-2 or PHQ-9 in the last 360 days      Passed - Valid encounter within last 6 months    Recent Outpatient Visits           3 weeks ago Annual physical exam   [place] [facility] at [facility], Jesse Sans, MD   5 months ago Acute exacerbation of chronic obstructive pulmonary disease (COPD) ([place])   [place] [facility] at [facility], Jesse Sans, MD   7 months ago NSCLC of left lung [facility])   [place] [facility] at [facility], Jesse Sans, MD   9 months ago Infective urethritis   [place] [facility] at [facility] Jesse Sans, MD   1 year ago Essential hypertension   [place] [facility] at [facility], Jesse Sans, MD

## 2022-02-09 DIAGNOSIS — Z419 Encounter for procedure for purposes other than remedying health state, unspecified: Secondary | ICD-10-CM | POA: Diagnosis not present

## 2022-02-21 ENCOUNTER — Ambulatory Visit: Payer: Self-pay | Admitting: *Deleted

## 2022-02-21 NOTE — Telephone Encounter (Signed)
Reason for Disposition  Side (flank) or lower back pain present    Does not have transportation wanting to know if Dr. Army Melia willing to call in an antibiotic since she has had UTIs before.  Answer Assessment - Initial Assessment Questions 1. SEVERITY: "How bad is the pain?"  (e.g., Scale 1-10; mild, moderate, or severe)   - MILD (1-3): complains slightly about urination hurting   - MODERATE (4-7): interferes with normal activities     - SEVERE (8-10): excruciating, unwilling or unable to urinate because of the pain      Having urinary burning, frequency.   Started [employer]. Evening.   I'm using the Azo.   I tested for UTI and it's positive.   I've had these before.  Having back pain too.     I don't have transportation can Dr. Army Melia call me in something?    2. FREQUENCY: "How many times have you had painful urination today?"      Frequency  3. PATTERN: "Is pain present every time you urinate or just sometimes?"      Burning every time   Using AZO. 4. ONSET: "When did the painful urination start?"      Thur. Evening it started and my whole weekend has been miserable even using the AZO which has helped. 5. FEVER: "Do you have a fever?" If Yes, ask: "What is your temperature, how was it measured, and when did it start?"     No 6. PAST UTI: "Have you had a urine infection before?" If Yes, ask: "When was the last time?" and "What happened that time?"      Yes  Several times.   The OTC test for UTI immediately turns purple and my symptoms are the same ones I get when I have a UTI. I don't have transportation to get to the office today.   Would Dr. Army Melia be willing to call me an antibiotic for me?  7. CAUSE: "What do you think is causing the painful urination?"  (e.g., UTI, scratch, Herpes sore)     UTI 8. OTHER SYMPTOMS: "Do you have any other symptoms?" (e.g., blood in urine, flank pain, genital sores, urgency, vaginal discharge)     Lower back pain, abd pain, frequency, burning 9.  PREGNANCY: "Is there any chance you are pregnant?" "When was your last menstrual period?"     N/A due to age  Protocols used: Urination Pain - Female-A-AH

## 2022-02-21 NOTE — Telephone Encounter (Signed)
Patient scheduled tomorrow with Dr Zigmund Daniel for UTI.

## 2022-02-21 NOTE — Telephone Encounter (Signed)
  Chief Complaint: Burning with urination, frequency   Positive OTC test for UTI.   Had many UTIs in past and these are her usual symptoms.  No transportation today.   Would Dr. Army Melia be willing to call in an antibiotic since she has had these UTIs before. Symptoms: burning, frequency, lower back pain, abd pain.   Using AZO over the weekend which has helped. Frequency: Since Thur. evening Pertinent Negatives: Patient denies blood in urine Disposition: [] ED /[] Urgent Care (no appt availability in office) / [] Appointment(In office/virtual)/ []  [place] [facility]/ [] Home Care/ [] Refused Recommended Disposition /[] [place] [facility]/ [x]  Follow-up with PCP Additional Notes: I have sent a message to Dr. Army Melia to see if she would be willing to call in an antibiotic at pt's request since she does not have transportation.   Pt. Agreeable to someone calling her back.

## 2022-02-22 ENCOUNTER — Encounter: Payer: Self-pay | Admitting: Family Medicine

## 2022-02-22 ENCOUNTER — Ambulatory Visit (INDEPENDENT_AMBULATORY_CARE_PROVIDER_SITE_OTHER): Payer: Medicaid Other | Admitting: Family Medicine

## 2022-02-22 VITALS — BP 118/78 | HR 86 | Ht 65.0 in | Wt 84.0 lb

## 2022-02-22 DIAGNOSIS — R399 Unspecified symptoms and signs involving the genitourinary system: Secondary | ICD-10-CM | POA: Diagnosis not present

## 2022-02-22 DIAGNOSIS — N3090 Cystitis, unspecified without hematuria: Secondary | ICD-10-CM | POA: Insufficient documentation

## 2022-02-22 MED ORDER — FLUCONAZOLE 150 MG PO TABS: 150.0000 mg | ORAL_TABLET | Freq: Once | ORAL | 0 refills | Status: AC

## 2022-02-22 MED ORDER — NITROFURANTOIN MONOHYD MACRO 100 MG PO CAPS: 100.0000 mg | ORAL_CAPSULE | Freq: Two times a day (BID) | ORAL | 0 refills | Status: DC

## 2022-02-22 NOTE — Assessment & Plan Note (Signed)
Patient presents with 6-day history of dysuria, pelvic pressure, colicky right greater than left inguinal and minor flank pain.  Denies fevers or chills, no discharge.  She has been dosing Azo with relief.  Denies any history of renal stones.  Examination with mild/equivocal CVA tenderness, most tender at the sacroiliac joints bilaterally, mild tenderness at the suprapubic region, normoactive bowel sounds and nontender abdomen without hepatosplenomegaly.  UA confounded by patient's Azo, no hematuria.  Chart review with prior E. coli UTI nearly pansensitive, plan for course of Macrobid, urine has been sent for culture.  Pending culture results, can modify or discontinue therapy, additional Diflucan sent if yeast noted.  If infectious etiology negative, renal stones to be considered, KUB evaluation.

## 2022-02-22 NOTE — Patient Instructions (Addendum)
-   Take antibiotics (Macrobid) for full course - Take diflucan if yeast symptoms develop - Drink plenty of fluids - We will contact you with results and next steps - Contact us for any questions

## 2022-02-22 NOTE — Progress Notes (Signed)
     Primary Care / Sports Medicine Office Visit  Patient Information:  Patient ID: Noheli Melder Ward, female DOB: 1960-06-20 Age: 61 y.o. MRN: [id number]   Kendra Mullins Ward is a pleasant 61 y.o. female presenting with the following:  Chief Complaint  Patient presents with   Urinary Tract Infection    Pt here with C/O frequent, painful, burning urination for 6 days.     Vitals:   02/22/22 1331  BP: 118/78  Pulse: 86  SpO2: 98%   Vitals:   02/22/22 1331  Weight: 84 lb (38.1 kg)  Height: 5\' 5"  (1.651 m)   Body mass index is 13.98 kg/m.  No results found.   Independent interpretation of notes and tests performed by another provider:   None  Procedures performed:   None  Pertinent History, Exam, Impression, and Recommendations:   Problem List Items Addressed This Visit       Genitourinary   Cystitis - Primary    Patient presents with 6-day history of dysuria, pelvic pressure, colicky right greater than left inguinal and minor flank pain.  Denies fevers or chills, no discharge.  She has been dosing Azo with relief.  Denies any history of renal stones.  Examination with mild/equivocal CVA tenderness, most tender at the sacroiliac joints bilaterally, mild tenderness at the suprapubic region, normoactive bowel sounds and nontender abdomen without hepatosplenomegaly.  UA confounded by patient's Azo, no hematuria.  Chart review with prior E. coli UTI nearly pansensitive, plan for course of Macrobid, urine has been sent for culture.  Pending culture results, can modify or discontinue therapy, additional Diflucan sent if yeast noted.  If infectious etiology negative, renal stones to be considered, KUB evaluation.      Relevant Medications   nitrofurantoin, macrocrystal-monohydrate, (MACROBID) 100 MG capsule   fluconazole (DIFLUCAN) 150 MG tablet   Other Relevant Orders   POCT urinalysis dipstick   Urine Culture     Orders & Medications Meds ordered this encounter   Medications   nitrofurantoin, macrocrystal-monohydrate, (MACROBID) 100 MG capsule    Sig: Take 1 capsule (100 mg total) by mouth 2 (two) times daily.    Dispense:  14 capsule    Refill:  0   fluconazole (DIFLUCAN) 150 MG tablet    Sig: Take 1 tablet (150 mg total) by mouth once for 1 dose.    Dispense:  1 tablet    Refill:  0   Orders Placed This Encounter  Procedures   Urine Culture   POCT urinalysis dipstick     No follow-ups on file.     Montel Culver, MD   Primary Care Sports Medicine [place]

## 2022-02-26 LAB — URINE CULTURE

## 2022-02-28 ENCOUNTER — Other Ambulatory Visit: Payer: Self-pay | Admitting: Family Medicine

## 2022-02-28 MED ORDER — AMOXICILLIN-POT CLAVULANATE 500-125 MG PO TABS: 1.0000 | ORAL_TABLET | Freq: Two times a day (BID) | ORAL | 0 refills | Status: AC

## 2022-03-11 DIAGNOSIS — Z419 Encounter for procedure for purposes other than remedying health state, unspecified: Secondary | ICD-10-CM | POA: Diagnosis not present

## 2022-03-18 ENCOUNTER — Encounter: Payer: Self-pay | Admitting: Family Medicine

## 2022-03-18 ENCOUNTER — Ambulatory Visit: Payer: Self-pay

## 2022-03-18 ENCOUNTER — Telehealth: Payer: Self-pay | Admitting: Internal Medicine

## 2022-03-18 NOTE — Telephone Encounter (Addendum)
Message from [employer] sent at 03/18/2022 12:30 PM EST  Summary: urinary discomfort   The patient has called to share that they're experiencing urinary discomfort, lower back discomfort and frequent urination  The patient has been taking AZO but shares that they're continuing to experience symptoms  The patient would like to speak with a member of clinical staff further when possible        Third attempt to contact pt. Left messages on VM. Routing to PCP office.

## 2022-03-18 NOTE — Telephone Encounter (Signed)
Message from [employer] sent at 03/18/2022 12:30 PM EST  Summary: urinary discomfort   The patient has called to share that they're experiencing urinary discomfort, lower back discomfort and frequent urination  The patient has been taking AZO but shares that they're continuing to experience symptoms  The patient would like to speak with a member of clinical staff further when possible        Second attempt to reach pt. LM on VM to call back.

## 2022-03-18 NOTE — Telephone Encounter (Signed)
Copied from [place]. Topic: General - Other >> Mar 18, 2022 12:30 PM Everette C wrote: Reason for CRM: The patient has called to request orders for an ultrasound   The patient is interested in having imaging related to their urinary concerns   Please contact further when possible

## 2022-03-18 NOTE — Telephone Encounter (Signed)
Message from [employer] sent at 03/18/2022 12:30 PM EST  Summary: urinary discomfort   The patient has called to share that they're experiencing urinary discomfort, lower back discomfort and frequent urination  The patient has been taking AZO but shares that they're continuing to experience symptoms  The patient would like to speak with a member of clinical staff further when possible        Called pt and LM on VM to call back to discuss sx.

## 2022-03-21 ENCOUNTER — Ambulatory Visit (INDEPENDENT_AMBULATORY_CARE_PROVIDER_SITE_OTHER): Payer: Medicaid Other | Admitting: Internal Medicine

## 2022-03-21 ENCOUNTER — Ambulatory Visit
Admission: RE | Admit: 2022-03-21 | Discharge: 2022-03-21 | Disposition: A | Discharge status: Home / Self Care | Payer: Medicaid Other | Attending: Internal Medicine | Admitting: Internal Medicine

## 2022-03-21 ENCOUNTER — Ambulatory Visit
Admission: RE | Admit: 2022-03-21 | Discharge: 2022-03-21 | Disposition: A | Discharge status: Home / Self Care | Payer: Medicaid Other | Source: Ambulatory Visit | Attending: Internal Medicine | Admitting: Internal Medicine

## 2022-03-21 ENCOUNTER — Encounter: Payer: Self-pay | Admitting: Internal Medicine

## 2022-03-21 VITALS — BP 136/62 | HR 106 | Ht 65.0 in | Wt 90.0 lb

## 2022-03-21 DIAGNOSIS — M545 Low back pain, unspecified: Secondary | ICD-10-CM

## 2022-03-21 DIAGNOSIS — R109 Unspecified abdominal pain: Secondary | ICD-10-CM | POA: Diagnosis not present

## 2022-03-21 DIAGNOSIS — C3492 Malignant neoplasm of unspecified part of left bronchus or lung: Secondary | ICD-10-CM | POA: Diagnosis not present

## 2022-03-21 DIAGNOSIS — F17200 Nicotine dependence, unspecified, uncomplicated: Secondary | ICD-10-CM | POA: Diagnosis not present

## 2022-03-21 LAB — POCT URINALYSIS DIPSTICK
Bilirubin, UA: NEGATIVE
Blood, UA: NEGATIVE
Glucose, UA: NEGATIVE
Ketones, UA: NEGATIVE
Leukocytes, UA: NEGATIVE
Nitrite, UA: NEGATIVE
Protein, UA: NEGATIVE
Spec Grav, UA: 1.01 (ref 1.010–1.025)
Urobilinogen, UA: 0.2 E.U./dL
pH, UA: 6 (ref 5.0–8.0)

## 2022-03-21 MED ORDER — CYCLOBENZAPRINE HCL 10 MG PO TABS: 10.0000 mg | ORAL_TABLET | Freq: Three times a day (TID) | ORAL | 0 refills | Status: DC | PRN

## 2022-03-21 NOTE — Progress Notes (Signed)
Date:  03/21/2022   Name:  Kendra Mullins   DOB:  [birth year]   MRN:  [id number]   Chief Complaint: Urinary Tract Infection Treated three weeks ago for a proteus UTI.  She feels like symptoms never completely resolved.  Now also has back pain. Back Pain This is a new problem. The current episode started in the past 7 days. The problem is unchanged. The pain is present in the lumbar spine. The quality of the pain is described as aching and burning. The pain does not radiate. Pertinent negatives include no bladder incontinence, bowel incontinence, chest pain or fever.   Lung cancer - screening CT showed a spiculated LLL mass.  [facility] recommended another scan in February and then decide on next steps.  She feels well, no change in cough or mild shortness of breath.  She has not been successful in quitting smoking.  Lab Results  Component Value Date   NA 137 01/14/2022   K 4.1 01/14/2022   CO2 27 01/14/2022   GLUCOSE 91 01/14/2022   BUN 10 01/14/2022   CREATININE 0.62 01/14/2022   CALCIUM 9.5 01/14/2022   EGFR 101 01/14/2022   GFRNONAA 102 10/25/2019   Lab Results  Component Value Date   CHOL 166 01/14/2022   HDL 109 01/14/2022   LDLCALC 48 01/14/2022   TRIG 35 01/14/2022   CHOLHDL 1.5 01/14/2022   Lab Results  Component Value Date   TSH 1.490 01/14/2022   No results found for: "HGBA1C" Lab Results  Component Value Date   WBC 7.2 01/14/2022   HGB 12.8 01/14/2022   HCT 37.1 01/14/2022   MCV 93 01/14/2022   PLT 335 01/14/2022   Lab Results  Component Value Date   ALT 30 01/14/2022   AST 29 01/14/2022   ALKPHOS 125 (H) 01/14/2022   BILITOT 0.3 01/14/2022   Lab Results  Component Value Date   VD25OH 17.7 (L) 05/04/2021     Review of Systems  Constitutional:  Negative for chills, fatigue and fever.  Respiratory:  Negative for chest tightness and shortness of breath.   Cardiovascular:  Negative for chest pain.  Gastrointestinal:  Negative for bowel  incontinence.  Genitourinary:  Negative for bladder incontinence.  Musculoskeletal:  Positive for back pain and myalgias.  Psychiatric/Behavioral:  Positive for sleep disturbance. Negative for dysphoric mood. The patient is not nervous/anxious.     Patient Active Problem List   Diagnosis Date Noted   Cystitis 02/22/2022   S/P total hysterectomy 05/19/2020   NSCLC of left lung ([place]) 01/29/2020   Closed fracture of head of left humerus 05/15/2019   Aortic atherosclerosis ([place]) 12/31/2017   TIA (transient ischemic attack) 06/25/2017   Localized primary osteoarthritis of hands, bilateral 05/02/2017   Continuous chronic alcoholism ([place]) 09/15/2014   Anxiety and depression 09/15/2014   Tobacco use disorder 09/15/2014   Essential hypertension 09/15/2014   Hot flash, menopausal 09/15/2014   MI (mitral incompetence) 09/15/2014   Centrilobular emphysema ([place]) 09/15/2014   Low weight 09/15/2014    Allergies  Allergen Reactions   Azithromycin Nausea Only   Codeine Nausea And Vomiting    Past Surgical History:  Procedure Laterality Date   TOTAL ABDOMINAL HYSTERECTOMY  1986   one ovary remains    Social History   Tobacco Use   Smoking status: Every Day    Packs/day: 0.50    Years: 40.00    Total pack years: 20.00    Types: Cigarettes   Smokeless tobacco:  Never   Tobacco comments:    Planning to quit on father's day  Vaping Use   Vaping Use: Every day  Substance Use Topics   Alcohol use: Yes    Alcohol/week: 50.0 standard drinks of alcohol    Types: 50 Standard drinks or equivalent per week   Drug use: No     Medication list has been reviewed and updated.  Current Meds  Medication Sig   albuterol (PROVENTIL) (2.5 MG/3ML) 0.083% nebulizer solution Take 3 mLs (2.5 mg total) by nebulization every 6 (six) hours as needed for wheezing or shortness of breath.   ALPRAZolam (XANAX) 0.5 MG tablet Take 0.5 mg by mouth 2 (two) times daily as needed for anxiety.   ASPIRIN LOW  DOSE 81 MG EC tablet TAKE 1 TABLET BY MOUTH ONCE DAILY   atorvastatin (LIPITOR) 40 MG tablet TAKE 1 TABLET(40 MG) BY MOUTH DAILY   ATROVENT HFA 17 MCG/ACT inhaler INHALE 2 PUFFS BY MOUTH EVERY 6 HOURS AS NEEDED FOR WHEEZING   benazepril (LOTENSIN) 5 MG tablet Take 1 tablet (5 mg total) by mouth daily.   diltiazem (TIAZAC) 240 MG 24 hr capsule TAKE 1 CAPSULE(240 MG) BY MOUTH DAILY   fluticasone (FLONASE) 50 MCG/ACT nasal spray SHAKE LIQUID AND USE 2 SPRAYS IN EACH NOSTRIL DAILY   Nebulizers (VIOS AEROSOL DELIVERY SYSTEM) MISC See admin instructions.   sertraline (ZOLOFT) 50 MG tablet Take 50 mg by mouth daily. [name]   varenicline (CHANTIX) 0.5 MG tablet Take 1 tablet (0.5 mg total) by mouth 2 (two) times daily.   VENTOLIN HFA 108 (90 Base) MCG/ACT inhaler INHALE 2 PUFFS INTO THE LUNGS EVERY 6 HOURS AS NEEDED FOR WHEEZING OR SHORTNESS OF BREATH       02/22/2022    1:34 PM 01/14/2022    3:00 PM 08/27/2021    4:11 PM 07/09/2021    3:01 PM  GAD 7 : Generalized Anxiety Score  Nervous, Anxious, on Edge 0 _0 Control/stop worrying _1 0  Worry too much - different things _2 Trouble relaxing 1 1 0 0  Restless 0 0 1 0  Easily annoyed or irritable 1 1 0 1  Afraid - awful might happen 1 1 0 0  Total GAD 7 Score _3 Anxiety Difficulty Somewhat difficult Somewhat difficult Not difficult at all        02/22/2022    1:34 PM 01/14/2022    3:00 PM 08/27/2021    4:10 PM  Depression screen PHQ 2/9  Decreased Interest _4 Down, Depressed, Hopeless _5 PHQ - 2 Score _6 Altered sleeping 1 3 0  Tired, decreased energy _7 Change in appetite 0 0 0  Feeling bad or failure about yourself  0 0 0  Trouble concentrating _8 Moving slowly or fidgety/restless 0 0 1  Suicidal thoughts 0 0 0  PHQ-9 Score _9 Difficult doing work/chores Somewhat difficult Somewhat difficult Somewhat difficult    BP Readings from Last 3 Encounters:  03/21/22 (!) 142/80   02/22/22 118/78  01/14/22 122/78    Physical Exam Vitals and nursing note reviewed.  Constitutional:      General: She is not in acute distress.    Appearance: She is well-developed.  HENT:     Head: Normocephalic and atraumatic.  Cardiovascular:     Rate and Rhythm: Normal rate  and regular rhythm.  Pulmonary:     Effort: Pulmonary effort is normal. No respiratory distress.     Breath sounds: No wheezing or rhonchi.  Abdominal:     General: Abdomen is flat.     Palpations: Abdomen is soft.     Tenderness: There is no right CVA tenderness or left CVA tenderness.  Genitourinary:    General: Normal vulva.  Musculoskeletal:     Cervical back: Normal range of motion.     Lumbar back: Bony tenderness present. Negative right straight leg raise test and negative left straight leg raise test.       Back:     Right lower leg: No edema.     Left lower leg: No edema.  Skin:    General: Skin is warm and dry.     Findings: No rash.  Neurological:     Mental Status: She is alert and oriented to person, place, and time.  Psychiatric:        Mood and Affect: Mood normal.        Behavior: Behavior normal.     Wt Readings from Last 3 Encounters:  03/21/22 90 lb (40.8 kg)  02/22/22 84 lb (38.1 kg)  01/14/22 84 lb (38.1 kg)    BP (!) 142/80   Pulse (!) 106   Ht _0  (1.651 m)   Wt 90 lb (40.8 kg)   SpO2 96%   BMI 14.98 kg/m   Assessment and Plan: 1. Flank pain UA negative Flank pain is likely muscular Could be referred pain from undefined LLL spiculated lung mass - POCT Urinalysis Dipstick  2. Acute bilateral low back pain without sciatica Will get imaging and begin Flexeril and heat - DG Lumbar Spine Complete; Future - cyclobenzaprine (FLEXERIL) 10 MG tablet; Take 1 tablet (10 mg total) by mouth 3 (three) times daily as needed for muscle spasms.  Dispense: 30 tablet; Refill: 0  3. NSCLC of left lung [facility]) [facility]en by [facility] They have decided to re-image in  February.  4. Tobacco use disorder She is working on quitting - was given Chantix last visit which has not been very helpful. She is minimally motivated at this time to quit   Partially dictated using Editor, commissioning. Any errors are unintentional.  Halina Maidens, MD [facility]  03/21/2022

## 2022-03-24 ENCOUNTER — Ambulatory Visit: Payer: Self-pay | Admitting: *Deleted

## 2022-03-24 NOTE — Telephone Encounter (Addendum)
Reason for Disposition  [1] Follow-up call to recent contact AND [2] information only call, no triage required  Answer Assessment - Initial Assessment Questions 1. REASON FOR CALL or QUESTION: "What is your reason for calling today?" or "How can I best help you?" or "What question do you have that I can help answer?"     Pt called in and was given her lumbar x ray result.     She was wanting to know if Dr. Army Melia could recommend anything that could slow down the process of arthritis so it doesn't get worse.  Any medications or supplements  Protocols used: Information Only Call - No Triage-A-AH Lumbar x ray note from Dr. Army Melia read to her dated 03/22/2022 at 1:38 PM.

## 2022-03-24 NOTE — Telephone Encounter (Signed)
Called and left patient VM informing of this.  Kendra Mullins

## 2022-04-02 ENCOUNTER — Other Ambulatory Visit: Payer: Self-pay | Admitting: Internal Medicine

## 2022-04-02 DIAGNOSIS — I10 Essential (primary) hypertension: Secondary | ICD-10-CM

## 2022-04-05 ENCOUNTER — Other Ambulatory Visit: Payer: Self-pay | Admitting: Internal Medicine

## 2022-04-11 DIAGNOSIS — Z419 Encounter for procedure for purposes other than remedying health state, unspecified: Secondary | ICD-10-CM | POA: Diagnosis not present

## 2022-05-06 ENCOUNTER — Other Ambulatory Visit: Payer: Self-pay | Admitting: Internal Medicine

## 2022-05-06 DIAGNOSIS — J438 Other emphysema: Secondary | ICD-10-CM

## 2022-05-09 NOTE — Telephone Encounter (Signed)
Requested Prescriptions  Pending Prescriptions Disp Refills   ATROVENT HFA 17 MCG/ACT inhaler [Pharmacy Med Name: ATROVENT HFA ORAL INHALER (200 INH)] 12.9 g 4    Sig: INHALE 2 PUFFS BY MOUTH EVERY 6 HOURS AS NEEDED FOR WHEEZING     Pulmonology:  Anticholinergic Agents Passed - 05/06/2022  8:44 PM      Passed - Valid encounter within last 12 months    Recent Outpatient Visits           1 month ago Flank pain   [place] [facility] at [facility], Jesse Sans, MD   2 months ago [place] [facility] [place], Earley Abide, MD   3 months ago Annual physical exam   [place] [facility], Jesse Sans, MD   8 months ago Acute exacerbation of chronic obstructive pulmonary disease (COPD) [facility])   [place] [facility] at [facility], Jesse Sans, MD   10 months ago NSCLC of left lung [facility])   [facility] at [facility], Jesse Sans, MD

## 2022-05-12 DIAGNOSIS — Z419 Encounter for procedure for purposes other than remedying health state, unspecified: Secondary | ICD-10-CM | POA: Diagnosis not present

## 2022-05-24 ENCOUNTER — Other Ambulatory Visit: Payer: Self-pay | Admitting: Internal Medicine

## 2022-06-10 DIAGNOSIS — Z419 Encounter for procedure for purposes other than remedying health state, unspecified: Secondary | ICD-10-CM | POA: Diagnosis not present

## 2022-06-23 ENCOUNTER — Other Ambulatory Visit: Payer: Self-pay | Admitting: Internal Medicine

## 2022-06-23 DIAGNOSIS — I700 Atherosclerosis of aorta: Secondary | ICD-10-CM

## 2022-06-23 NOTE — Telephone Encounter (Signed)
Requested Prescriptions  Pending Prescriptions Disp Refills   atorvastatin (LIPITOR) 40 MG tablet [Pharmacy Med Name: ATORVASTATIN '40MG'$  TABLETS] 30 tablet 5    Sig: TAKE 1 TABLET(40 MG) BY MOUTH DAILY     Cardiovascular:  Antilipid - Statins Failed - 06/23/2022  6:24 AM      Failed - Lipid Panel in normal range within the last 12 months    Cholesterol, Total  Date Value Ref Range Status  01/14/2022 166 100 - 199 mg/dL Final   LDL Chol Calc (NIH)  Date Value Ref Range Status  01/14/2022 48 0 - 99 mg/dL Final   HDL  Date Value Ref Range Status  01/14/2022 109 >39 mg/dL Final   Triglycerides  Date Value Ref Range Status  01/14/2022 35 0 - 149 mg/dL Final         Passed - Patient is not pregnant      Passed - Valid encounter within last 12 months    Recent Outpatient Visits           3 months ago Flank pain   [place] [facility] at [facility], Jesse Sans, MD   4 months ago Cystitis   [place] [facility] at [place], Earley Abide, MD   5 months ago Annual physical exam   [place] [facility], Jesse Sans, MD   10 months ago Acute exacerbation of chronic obstructive pulmonary disease (COPD) [facility])   [place] [facility] at [facility], Jesse Sans, MD   11 months ago NSCLC of left lung [facility])   [place] [facility] at [facility], Jesse Sans, MD

## 2022-06-28 ENCOUNTER — Ambulatory Visit (INDEPENDENT_AMBULATORY_CARE_PROVIDER_SITE_OTHER): Payer: Medicaid Other | Admitting: Family Medicine

## 2022-06-28 ENCOUNTER — Other Ambulatory Visit: Payer: Self-pay | Admitting: Internal Medicine

## 2022-06-28 ENCOUNTER — Encounter: Payer: Self-pay | Admitting: Family Medicine

## 2022-06-28 VITALS — BP 136/76 | HR 100 | Ht 65.0 in | Wt 86.6 lb

## 2022-06-28 DIAGNOSIS — J0100 Acute maxillary sinusitis, unspecified: Secondary | ICD-10-CM | POA: Insufficient documentation

## 2022-06-28 DIAGNOSIS — I10 Essential (primary) hypertension: Secondary | ICD-10-CM

## 2022-06-28 MED ORDER — PROMETHAZINE-DM 6.25-15 MG/5ML PO SYRP: 5.0000 mL | ORAL_SOLUTION | Freq: Four times a day (QID) | ORAL | 0 refills | Status: DC | PRN

## 2022-06-28 MED ORDER — AMOXICILLIN-POT CLAVULANATE 875-125 MG PO TABS: 1.0000 | ORAL_TABLET | Freq: Two times a day (BID) | ORAL | 0 refills | Status: DC

## 2022-06-28 NOTE — Assessment & Plan Note (Signed)
Roughly 2 weeks of symptoms involving facial pressure and pain, congestion, mild shortness of air in the setting of chronic smoking.  Denies fevers, chills, treatment has included Tylenol.  Examination with erythematous and swollen turbinates bilaterally, erythematous oropharynx without exudate, tympanic membranes and canals bilaterally are clear, tenderness bilaterally, primarily at the maxillary sinus region.  Lung fields do demonstrate equal air entry with mild restriction.  Plan for course of Augmentin, Mucinex twice daily, as needed antitussives prescribed, and sample of air supra provided to use in place of her current albuterol while on antibiotics.  She can follow-up as needed.

## 2022-06-28 NOTE — Progress Notes (Signed)
     Primary Care / Sports Medicine Office Visit  Patient Information:  Patient ID: Kendra Mullins, female DOB: July 09, 1960 Age: 61 y.o. MRN: [id number]   Kendra Mullins is a pleasant 62 y.o. female presenting with the following:  Chief Complaint  Patient presents with   Cough    2-3 weeks coughing yellow phlegm     Vitals:   06/28/22 1628  BP: 136/76  Pulse: 100  SpO2: 95%   Vitals:   06/28/22 1628  Weight: 86 lb 9.6 oz (39.3 kg)  Height: 5\' 5"  (1.651 m)   Body mass index is 14.41 kg/m.  No results found.   Independent interpretation of notes and tests performed by another provider:   None  Procedures performed:   None  Pertinent History, Exam, Impression, and Recommendations:   Kendra Mullins was seen today for cough.  Acute maxillary sinusitis, recurrence not specified Assessment & Plan: Roughly 2 weeks of symptoms involving facial pressure and pain, congestion, mild shortness of air in the setting of chronic smoking.  Denies fevers, chills, treatment has included Tylenol.  Examination with erythematous and swollen turbinates bilaterally, erythematous oropharynx without exudate, tympanic membranes and canals bilaterally are clear, tenderness bilaterally, primarily at the maxillary sinus region.  Lung fields do demonstrate equal air entry with mild restriction.  Plan for course of Augmentin, Mucinex twice daily, as needed antitussives prescribed, and sample of air supra provided to use in place of her current albuterol while on antibiotics.  She can follow-up as needed.  Orders: -     Amoxicillin-Pot Clavulanate; Take 1 tablet by mouth 2 (two) times daily.  Dispense: 20 tablet; Refill: 0 -     Promethazine-DM; Take 5 mLs by mouth 4 (four) times daily as needed for cough.  Dispense: 118 mL; Refill: 0     Orders & Medications Meds ordered this encounter  Medications   amoxicillin-clavulanate (AUGMENTIN) 875-125 MG tablet    Sig: Take 1 tablet by mouth 2 (two)  times daily.    Dispense:  20 tablet    Refill:  0   promethazine-dextromethorphan (PROMETHAZINE-DM) 6.25-15 MG/5ML syrup    Sig: Take 5 mLs by mouth 4 (four) times daily as needed for cough.    Dispense:  118 mL    Refill:  0   No orders of the defined types were placed in this encounter.    No follow-ups on file.     Montel Culver, MD, [facility]   Primary Care Sports Medicine Primary Care and Sports Medicine at [facility]

## 2022-06-29 NOTE — Telephone Encounter (Signed)
Requested Prescriptions  Pending Prescriptions Disp Refills   diltiazem (TIAZAC) 240 MG 24 hr capsule [Pharmacy Med Name: DILTIAZEM ER 240MG  CAPSULES (24 HR)] 90 capsule 0    Sig: TAKE 1 CAPSULE(240 MG) BY MOUTH DAILY     Cardiovascular: Calcium Channel Blockers 3 Passed - 06/28/2022  6:00 PM      Passed - ALT in normal range and within 360 days    ALT  Date Value Ref Range Status  01/14/2022 30 0 - 32 IU/L Final         Passed - AST in normal range and within 360 days    AST  Date Value Ref Range Status  01/14/2022 29 0 - 40 IU/L Final         Passed - Cr in normal range and within 360 days    Creatinine, Ser  Date Value Ref Range Status  01/14/2022 0.62 0.57 - 1.00 mg/dL Final         Passed - Last BP in normal range    BP Readings from Last 1 Encounters:  06/28/22 136/76         Passed - Last Heart Rate in normal range    Pulse Readings from Last 1 Encounters:  06/28/22 100         Passed - Valid encounter within last 6 months    Recent Outpatient Visits           Yesterday Acute maxillary sinusitis, recurrence not specified   [facility] [place], Earley Abide, MD   3 months ago Flank pain   [place] [facility] at [facility], Jesse Sans, MD   4 months ago [place] [facility] [place], Earley Abide, MD   5 months ago Annual physical exam   [place] at [facility], Jesse Sans, MD   10 months ago Acute exacerbation of chronic obstructive pulmonary disease (COPD) [facility])    Primary Care & Sports Medicine at [facility], Jesse Sans, MD

## 2022-07-06 ENCOUNTER — Other Ambulatory Visit: Payer: Self-pay | Admitting: Internal Medicine

## 2022-07-07 NOTE — Telephone Encounter (Signed)
Requested Prescriptions  Pending Prescriptions Disp Refills   VENTOLIN HFA 108 (90 Base) MCG/ACT inhaler [Pharmacy Med Name: VENTOLIN HFA INH W/DOS CTR 200PUFFS] 18 g 0    Sig: INHALE 2 PUFFS INTO THE LUNGS EVERY 6 HOURS AS NEEDED FOR WHEEZING OR SHORTNESS OF BREATH     Pulmonology:  Beta Agonists 2 Passed - 07/06/2022  3:19 PM      Passed - Last BP in normal range    BP Readings from Last 1 Encounters:  06/28/22 136/76         Passed - Last Heart Rate in normal range    Pulse Readings from Last 1 Encounters:  06/28/22 100         Passed - Valid encounter within last 12 months    Recent Outpatient Visits           1 week ago Acute maxillary sinusitis, recurrence not specified   [place] [facility] at [place], Earley Abide, MD   3 months ago Flank pain   [place] [facility] at [facility], Jesse Sans, MD   4 months ago Cystitis   [place] [facility] [place], Earley Abide, MD   5 months ago Annual physical exam   [place] at [facility], Jesse Sans, MD   10 months ago Acute exacerbation of chronic obstructive pulmonary disease (COPD) [facility])   [place] [facility] at [facility], Jesse Sans, MD

## 2022-07-08 ENCOUNTER — Encounter: Payer: Self-pay | Admitting: Family Medicine

## 2022-07-08 ENCOUNTER — Telehealth (INDEPENDENT_AMBULATORY_CARE_PROVIDER_SITE_OTHER): Payer: Medicaid Other | Admitting: Family Medicine

## 2022-07-08 ENCOUNTER — Ambulatory Visit: Payer: Self-pay | Admitting: *Deleted

## 2022-07-08 DIAGNOSIS — J0100 Acute maxillary sinusitis, unspecified: Secondary | ICD-10-CM

## 2022-07-08 MED ORDER — METHYLPREDNISOLONE 4 MG PO TBPK: ORAL_TABLET | ORAL | 0 refills | Status: DC

## 2022-07-08 NOTE — Telephone Encounter (Signed)
Summary: Headache, ear pain, congestion   The patient called in stating she saw a provider last week and was prescribed an antibiotic last week. She states the medicine is not working. She has 1 day left of her antibiotic.Marland Kitchen She says it is the amoxicillin-clavulanate (AUGMENTIN) 875-125 MG tablet. She says she still has bad congestion, ear pain and headache. She does say the cough syrup did help the cough. Please assist patient further.       Reason for Disposition  [1] Taking antibiotic > 72 hours (3 days) AND [2] sinus pain not improved  Answer Assessment - Initial Assessment Questions 1. ANTIBIOTIC: "What antibiotic are you taking?" "How many times a day?"     Augmentin 875-125 2. ONSET: "When was the antibiotic started?"     Has 1 doses left 3. PAIN: "How bad is the sinus pain?"   (Scale 1-10; mild, moderate or severe)   - MILD (1-3): doesn't interfere with normal activities    - MODERATE (4-7): interferes with normal activities (e.g., work or school) or awakens from sleep   - SEVERE (8-10): excruciating pain and patient unable to do any normal activities        Headache, sore throat- ear pain- moderate 4. FEVER: "Do you have a fever?" If Yes, ask: "What is it, how was it measured, and when did it start?"      No- chills at times 5. SYMPTOMS: "Are there any other symptoms you're concerned about?" If Yes, ask: "When did it start?"     Headache, ear pain, sore throat- patient reports she has moments of improvement- but goes back into symptoms.  Protocols used: Sinus Infection on Antibiotic Follow-up Call-A-[facility]

## 2022-07-08 NOTE — Patient Instructions (Addendum)
-   Patient to take a home COVID test and contact us if positive for Paxlovid/alternate regimen - Start Medrol Dosepak and take for full course - Continue Rx cough medicine as needed - Can stop antibiotics - If still symptomatic despite the above by next week, schedule an office visit

## 2022-07-08 NOTE — Telephone Encounter (Signed)
  Chief Complaint: antibiotic treatment- not getting better Symptoms: headache, sore throat, ear pressure Frequency: patient in office 06/28/22- she reports she is not getting better Pertinent Negatives: Patient denies fever today Disposition: [] ED /[] Urgent Care (no appt availability in office) / [x] Appointment(In office/virtual)/ []  [facility]/ [] Home Care/ [] Refused Recommended Disposition /[] [place] [facility]/ []  Follow-up with PCP Additional Notes: Patient is requesting reevaluation- she is not improving with current treatment- appointment scheduled

## 2022-07-08 NOTE — Progress Notes (Signed)
Primary Care / Sports Medicine Virtual Visit  Patient Information:  Patient ID: Kendra Mullins, female DOB: 09-14-1960 Age: 62 y.o. MRN: [id number]   Kendra Mullins is a pleasant 62 y.o. female presenting with the following:  Chief Complaint  Patient presents with   Cough   Sinusitis    Review of Systems: No fevers, chills, night sweats, weight loss, chest pain, or shortness of breath.   Patient Active Problem List   Diagnosis Date Noted   Acute maxillary sinusitis 06/28/2022   Cystitis 02/22/2022   S/P total hysterectomy 05/19/2020   NSCLC of left lung ([place]) 01/29/2020   Closed fracture of head of left humerus 05/15/2019   Aortic atherosclerosis ([place]) 12/31/2017   TIA (transient ischemic attack) 06/25/2017   Localized primary osteoarthritis of hands, bilateral 05/02/2017   Continuous chronic alcoholism ([place]) 09/15/2014   Anxiety and depression 09/15/2014   Tobacco use disorder 09/15/2014   Essential hypertension 09/15/2014   Hot flash, menopausal 09/15/2014   MI (mitral incompetence) 09/15/2014   Centrilobular emphysema ([place]) 09/15/2014   Low weight 09/15/2014   Past Medical History:  Diagnosis Date   Ankle inflammation 09/15/2014   COPD (chronic obstructive pulmonary disease) (HCC)    Hypertension    PTSD (post-traumatic stress disorder)    Outpatient Encounter Medications as of 07/08/2022  Medication Sig   methylPREDNISolone (MEDROL DOSEPAK) 4 MG TBPK tablet Take for full course per package instructions   albuterol (PROVENTIL) (2.5 MG/3ML) 0.083% nebulizer solution Take 3 mLs (2.5 mg total) by nebulization every 6 (six) hours as needed for wheezing or shortness of breath.   ALPRAZolam (XANAX) 0.5 MG tablet Take 0.5 mg by mouth 2 (two) times daily as needed for anxiety.   amoxicillin-clavulanate (AUGMENTIN) 875-125 MG tablet Take 1 tablet by mouth 2 (two) times daily.   ASPIRIN LOW DOSE 81 MG EC tablet TAKE 1 TABLET BY MOUTH ONCE DAILY   atorvastatin  (LIPITOR) 40 MG tablet TAKE 1 TABLET(40 MG) BY MOUTH DAILY   ATROVENT HFA 17 MCG/ACT inhaler INHALE 2 PUFFS BY MOUTH EVERY 6 HOURS AS NEEDED FOR WHEEZING   benazepril (LOTENSIN) 5 MG tablet Take 1 tablet (5 mg total) by mouth daily.   cyclobenzaprine (FLEXERIL) 10 MG tablet Take 1 tablet (10 mg total) by mouth 3 (three) times daily as needed for muscle spasms.   diltiazem (TIAZAC) 240 MG 24 hr capsule TAKE 1 CAPSULE(240 MG) BY MOUTH DAILY   fluticasone (FLONASE) 50 MCG/ACT nasal spray SHAKE LIQUID AND USE 2 SPRAYS IN EACH NOSTRIL DAILY   Nebulizers (VIOS AEROSOL DELIVERY SYSTEM) MISC See admin instructions.   promethazine-dextromethorphan (PROMETHAZINE-DM) 6.25-15 MG/5ML syrup Take 5 mLs by mouth 4 (four) times daily as needed for cough.   sertraline (ZOLOFT) 50 MG tablet Take 50 mg by mouth daily. Kendra Mullins   varenicline (CHANTIX) 0.5 MG tablet Take 1 tablet (0.5 mg total) by mouth 2 (two) times daily.   VENTOLIN HFA 108 (90 Base) MCG/ACT inhaler INHALE 2 PUFFS INTO THE LUNGS EVERY 6 HOURS AS NEEDED FOR WHEEZING OR SHORTNESS OF BREATH   No facility-administered encounter medications on file as of 07/08/2022.   Past Surgical History:  Procedure Laterality Date   TOTAL ABDOMINAL HYSTERECTOMY  1986   one ovary remains    Virtual Visit via MyChart Video:   I connected with Kendra Mullins on 07/08/22 via MyChart Video and verified that I am speaking with the correct person using appropriate identifiers.   The limitations, risks, security  and privacy concerns of performing an evaluation and management service by MyChart Video, including the higher likelihood of inaccurate diagnoses and treatments, and the availability of in person appointments were reviewed. The possible need of an additional face-to-face encounter for complete and high quality delivery of care was discussed. The patient was also made aware that there may be a patient responsible charge related to this service. The patient  expressed understanding and wishes to proceed.  Provider location is in medical facility. Patient location is at their home, different from provider location. People involved in care of the patient during this telehealth encounter were myself, my nurse/medical assistant, and my front office/scheduling team member.  Objective findings:   General: Speaking full sentences, no audible heavy breathing. Sounds alert and appropriately interactive. Well-appearing. Face symmetric. Extraocular movements intact. Pupils equal and round. No nasal flaring or accessory muscle use visualized.  Independent interpretation of notes and tests performed by another provider:   None  Pertinent History, Exam, Impression, and Recommendations:   Acute maxillary sinusitis Patient presents for follow-up to sinusitis, has been compliant with antibiotic regimen though has not noted significant improvement, no worsening reported.  Given her stable symptom report I have discussed the possibility of viral etiology, specifically COVID.  Though we are far from symptom onset, given her health history and comorbid medical risks, if found positive I would have a low threshold to treat.    Plan as follows: - Patient to take a home COVID test and contact us if positive for Paxlovid/alternate regimen - Start Medrol Dosepak and take for full course - Continue Rx cough medicine as needed - Can stop antibiotics - If still symptomatic despite the above by next week, schedule an office visit  Orders & Medications Meds ordered this encounter  Medications   methylPREDNISolone (MEDROL DOSEPAK) 4 MG TBPK tablet    Sig: Take for full course per package instructions    Dispense:  21 tablet    Refill:  0   No orders of the defined types were placed in this encounter.    I discussed the above assessment and treatment plan with the patient. The patient was provided an opportunity to ask questions and all were answered. The patient  agreed with the plan and demonstrated an understanding of the instructions.   The patient was advised to call back or seek an in-person evaluation if the symptoms worsen or if the condition fails to improve as anticipated.   I provided a total time of 30 minutes including both face-to-face and non-face-to-face time on 07/08/2022 inclusive of time utilized for medical chart review, information gathering, care coordination with staff, and documentation completion.    Montel Culver, MD, [facility]   Primary Care Sports Medicine Primary Care and Sports Medicine at [facility]

## 2022-07-08 NOTE — Assessment & Plan Note (Signed)
Patient presents for follow-up to sinusitis, has been compliant with antibiotic regimen though has not noted significant improvement, no worsening reported.  Given her stable symptom report I have discussed the possibility of viral etiology, specifically COVID.  Though we are far from symptom onset, given her health history and comorbid medical risks, if found positive I would have a low threshold to treat.    Plan as follows: - Patient to take a home COVID test and contact us if positive for Paxlovid/alternate regimen - Start Medrol Dosepak and take for full course - Continue Rx cough medicine as needed - Can stop antibiotics - If still symptomatic despite the above by next week, schedule an office visit

## 2022-07-11 DIAGNOSIS — Z419 Encounter for procedure for purposes other than remedying health state, unspecified: Secondary | ICD-10-CM | POA: Diagnosis not present

## 2022-07-15 DIAGNOSIS — F331 Major depressive disorder, recurrent, moderate: Secondary | ICD-10-CM | POA: Diagnosis not present

## 2022-07-15 DIAGNOSIS — F410 Panic disorder [episodic paroxysmal anxiety] without agoraphobia: Secondary | ICD-10-CM | POA: Diagnosis not present

## 2022-07-20 ENCOUNTER — Telehealth: Payer: Self-pay | Admitting: Internal Medicine

## 2022-07-20 NOTE — Telephone Encounter (Signed)
Copied from CRM [id number]. Topic: General - Inquiry >> Jul 20, 2022  3:50 PM Clide Dales wrote: [place] would put in an order for CT Chest WO Contrast to be done in [place] instead of having to go back to [facility]. Please advise.

## 2022-07-21 ENCOUNTER — Telehealth: Payer: Self-pay

## 2022-07-21 NOTE — Telephone Encounter (Signed)
..   Medicaid Managed Care   Unsuccessful Outreach Note  07/21/2022 Name: Kendra Mullins MRN: [id number] DOB: [birth year]  Referred by: Reubin Milan, MD Reason for referral : Appointment   An unsuccessful telephone outreach was attempted today. The patient was referred to the case management team for assistance with care management and care coordination.   Follow Up Plan: A HIPAA compliant phone message was left for the patient providing contact information and requesting a return call.  The care management team will reach out to the patient again over the next 7-14 days.   Weston Settle Care Guide  [facility] Managed  [facility]  [phone]

## 2022-07-22 ENCOUNTER — Telehealth: Payer: Self-pay | Admitting: Internal Medicine

## 2022-07-22 ENCOUNTER — Telehealth: Payer: Self-pay

## 2022-07-22 NOTE — Telephone Encounter (Signed)
..  Patient declines further follow up and engagement by the Managed Medicaid Team. Appropriate care team members and provider have been notified via electronic communication. The Managed Medicaid Team is available to follow up with the patient after provider conversation with the patient regarding recommendation for engagement and subsequent re-referral to the Managed Medicaid Team.     Kyrstyn Greear Care Guide  Medicaid Managed    [phone]  

## 2022-07-22 NOTE — Telephone Encounter (Signed)
Pt is calling because she wants to know if her referral can be sent to an office in [facility] for her CT scan so she won't have to go to [facility] twice. PT says it is more convenient for her to go to somewhere in [place]. Please follow up with patient.

## 2022-07-25 DIAGNOSIS — I5020 Unspecified systolic (congestive) heart failure: Secondary | ICD-10-CM | POA: Diagnosis not present

## 2022-07-25 DIAGNOSIS — I471 Supraventricular tachycardia, unspecified: Secondary | ICD-10-CM | POA: Diagnosis not present

## 2022-07-25 DIAGNOSIS — I110 Hypertensive heart disease with heart failure: Secondary | ICD-10-CM | POA: Diagnosis not present

## 2022-07-25 DIAGNOSIS — Z743 Need for continuous supervision: Secondary | ICD-10-CM | POA: Diagnosis not present

## 2022-07-25 DIAGNOSIS — I5A Non-ischemic myocardial injury (non-traumatic): Secondary | ICD-10-CM | POA: Diagnosis not present

## 2022-07-25 DIAGNOSIS — R918 Other nonspecific abnormal finding of lung field: Secondary | ICD-10-CM | POA: Diagnosis not present

## 2022-07-25 DIAGNOSIS — F419 Anxiety disorder, unspecified: Secondary | ICD-10-CM | POA: Diagnosis not present

## 2022-07-25 DIAGNOSIS — E876 Hypokalemia: Secondary | ICD-10-CM | POA: Diagnosis not present

## 2022-07-25 DIAGNOSIS — Z4682 Encounter for fitting and adjustment of non-vascular catheter: Secondary | ICD-10-CM | POA: Diagnosis not present

## 2022-07-25 DIAGNOSIS — J439 Emphysema, unspecified: Secondary | ICD-10-CM | POA: Diagnosis not present

## 2022-07-25 DIAGNOSIS — F17210 Nicotine dependence, cigarettes, uncomplicated: Secondary | ICD-10-CM | POA: Diagnosis not present

## 2022-07-25 DIAGNOSIS — E785 Hyperlipidemia, unspecified: Secondary | ICD-10-CM | POA: Diagnosis not present

## 2022-07-25 DIAGNOSIS — W19XXXA Unspecified fall, initial encounter: Secondary | ICD-10-CM | POA: Diagnosis not present

## 2022-07-25 DIAGNOSIS — I10 Essential (primary) hypertension: Secondary | ICD-10-CM | POA: Diagnosis not present

## 2022-07-25 DIAGNOSIS — Y906 Blood alcohol level of 120-199 mg/100 ml: Secondary | ICD-10-CM | POA: Diagnosis not present

## 2022-07-25 DIAGNOSIS — R000 Tachycardia, unspecified: Secondary | ICD-10-CM | POA: Diagnosis not present

## 2022-07-25 DIAGNOSIS — F10139 Alcohol abuse with withdrawal, unspecified: Secondary | ICD-10-CM | POA: Diagnosis not present

## 2022-07-25 DIAGNOSIS — I4510 Unspecified right bundle-branch block: Secondary | ICD-10-CM | POA: Diagnosis not present

## 2022-07-25 DIAGNOSIS — R55 Syncope and collapse: Secondary | ICD-10-CM | POA: Diagnosis not present

## 2022-07-25 DIAGNOSIS — J984 Other disorders of lung: Secondary | ICD-10-CM | POA: Diagnosis not present

## 2022-07-25 DIAGNOSIS — E871 Hypo-osmolality and hyponatremia: Secondary | ICD-10-CM | POA: Diagnosis not present

## 2022-07-25 DIAGNOSIS — F40240 Claustrophobia: Secondary | ICD-10-CM | POA: Diagnosis not present

## 2022-07-25 DIAGNOSIS — S4991XA Unspecified injury of right shoulder and upper arm, initial encounter: Secondary | ICD-10-CM | POA: Diagnosis not present

## 2022-07-25 DIAGNOSIS — J441 Chronic obstructive pulmonary disease with (acute) exacerbation: Secondary | ICD-10-CM | POA: Diagnosis not present

## 2022-07-25 NOTE — Telephone Encounter (Signed)
Called pt left VM to call back. The [place] location does not do CT scans.  KP

## 2022-07-26 DIAGNOSIS — F10930 Alcohol use, unspecified with withdrawal, uncomplicated: Secondary | ICD-10-CM | POA: Diagnosis not present

## 2022-07-26 DIAGNOSIS — R55 Syncope and collapse: Secondary | ICD-10-CM | POA: Diagnosis not present

## 2022-07-26 DIAGNOSIS — J439 Emphysema, unspecified: Secondary | ICD-10-CM | POA: Diagnosis not present

## 2022-07-26 DIAGNOSIS — I4891 Unspecified atrial fibrillation: Secondary | ICD-10-CM | POA: Insufficient documentation

## 2022-07-26 DIAGNOSIS — C3412 Malignant neoplasm of upper lobe, left bronchus or lung: Secondary | ICD-10-CM | POA: Diagnosis not present

## 2022-07-27 DIAGNOSIS — R55 Syncope and collapse: Secondary | ICD-10-CM | POA: Diagnosis not present

## 2022-07-27 DIAGNOSIS — J441 Chronic obstructive pulmonary disease with (acute) exacerbation: Secondary | ICD-10-CM | POA: Diagnosis not present

## 2022-07-27 DIAGNOSIS — I5020 Unspecified systolic (congestive) heart failure: Secondary | ICD-10-CM | POA: Diagnosis not present

## 2022-07-27 DIAGNOSIS — R079 Chest pain, unspecified: Secondary | ICD-10-CM | POA: Diagnosis not present

## 2022-07-27 DIAGNOSIS — F419 Anxiety disorder, unspecified: Secondary | ICD-10-CM | POA: Diagnosis not present

## 2022-07-27 DIAGNOSIS — C3412 Malignant neoplasm of upper lobe, left bronchus or lung: Secondary | ICD-10-CM | POA: Diagnosis not present

## 2022-07-28 DIAGNOSIS — J441 Chronic obstructive pulmonary disease with (acute) exacerbation: Secondary | ICD-10-CM | POA: Diagnosis not present

## 2022-07-28 DIAGNOSIS — Y92002 Bathroom of unspecified non-institutional (private) residence single-family (private) house as the place of occurrence of the external cause: Secondary | ICD-10-CM | POA: Diagnosis not present

## 2022-07-28 DIAGNOSIS — R918 Other nonspecific abnormal finding of lung field: Secondary | ICD-10-CM | POA: Diagnosis not present

## 2022-07-28 DIAGNOSIS — C3412 Malignant neoplasm of upper lobe, left bronchus or lung: Secondary | ICD-10-CM | POA: Diagnosis not present

## 2022-07-28 DIAGNOSIS — R079 Chest pain, unspecified: Secondary | ICD-10-CM | POA: Diagnosis not present

## 2022-07-28 DIAGNOSIS — R590 Localized enlarged lymph nodes: Secondary | ICD-10-CM | POA: Diagnosis not present

## 2022-07-28 DIAGNOSIS — W1839XA Other fall on same level, initial encounter: Secondary | ICD-10-CM | POA: Diagnosis not present

## 2022-07-28 DIAGNOSIS — F419 Anxiety disorder, unspecified: Secondary | ICD-10-CM | POA: Diagnosis not present

## 2022-07-28 DIAGNOSIS — R55 Syncope and collapse: Secondary | ICD-10-CM | POA: Diagnosis not present

## 2022-07-29 DIAGNOSIS — R079 Chest pain, unspecified: Secondary | ICD-10-CM | POA: Diagnosis not present

## 2022-07-29 DIAGNOSIS — R55 Syncope and collapse: Secondary | ICD-10-CM | POA: Diagnosis not present

## 2022-07-29 DIAGNOSIS — C3412 Malignant neoplasm of upper lobe, left bronchus or lung: Secondary | ICD-10-CM | POA: Diagnosis not present

## 2022-07-29 DIAGNOSIS — F419 Anxiety disorder, unspecified: Secondary | ICD-10-CM | POA: Diagnosis not present

## 2022-07-29 DIAGNOSIS — J441 Chronic obstructive pulmonary disease with (acute) exacerbation: Secondary | ICD-10-CM | POA: Diagnosis not present

## 2022-07-30 DIAGNOSIS — I5020 Unspecified systolic (congestive) heart failure: Secondary | ICD-10-CM | POA: Diagnosis not present

## 2022-07-30 DIAGNOSIS — F10930 Alcohol use, unspecified with withdrawal, uncomplicated: Secondary | ICD-10-CM | POA: Diagnosis not present

## 2022-07-30 DIAGNOSIS — I4891 Unspecified atrial fibrillation: Secondary | ICD-10-CM | POA: Diagnosis not present

## 2022-07-30 DIAGNOSIS — R55 Syncope and collapse: Secondary | ICD-10-CM | POA: Diagnosis not present

## 2022-07-31 DIAGNOSIS — I5020 Unspecified systolic (congestive) heart failure: Secondary | ICD-10-CM | POA: Diagnosis not present

## 2022-07-31 DIAGNOSIS — R55 Syncope and collapse: Secondary | ICD-10-CM | POA: Diagnosis not present

## 2022-07-31 DIAGNOSIS — F10930 Alcohol use, unspecified with withdrawal, uncomplicated: Secondary | ICD-10-CM | POA: Diagnosis not present

## 2022-07-31 DIAGNOSIS — I4891 Unspecified atrial fibrillation: Secondary | ICD-10-CM | POA: Diagnosis not present

## 2022-08-08 DIAGNOSIS — Z1331 Encounter for screening for depression: Secondary | ICD-10-CM | POA: Diagnosis not present

## 2022-08-08 DIAGNOSIS — J441 Chronic obstructive pulmonary disease with (acute) exacerbation: Secondary | ICD-10-CM | POA: Diagnosis not present

## 2022-08-08 DIAGNOSIS — F1011 Alcohol abuse, in remission: Secondary | ICD-10-CM | POA: Diagnosis not present

## 2022-08-08 DIAGNOSIS — Z1330 Encounter for screening examination for mental health and behavioral disorders, unspecified: Secondary | ICD-10-CM | POA: Diagnosis not present

## 2022-08-08 DIAGNOSIS — K5900 Constipation, unspecified: Secondary | ICD-10-CM | POA: Diagnosis not present

## 2022-08-08 DIAGNOSIS — I5020 Unspecified systolic (congestive) heart failure: Secondary | ICD-10-CM | POA: Diagnosis not present

## 2022-08-08 DIAGNOSIS — I110 Hypertensive heart disease with heart failure: Secondary | ICD-10-CM | POA: Diagnosis not present

## 2022-08-09 ENCOUNTER — Other Ambulatory Visit: Payer: Self-pay | Admitting: Internal Medicine

## 2022-08-10 DIAGNOSIS — Z419 Encounter for procedure for purposes other than remedying health state, unspecified: Secondary | ICD-10-CM | POA: Diagnosis not present

## 2022-08-16 DIAGNOSIS — Z87898 Personal history of other specified conditions: Secondary | ICD-10-CM | POA: Diagnosis not present

## 2022-08-16 DIAGNOSIS — J449 Chronic obstructive pulmonary disease, unspecified: Secondary | ICD-10-CM | POA: Diagnosis not present

## 2022-08-16 DIAGNOSIS — F17210 Nicotine dependence, cigarettes, uncomplicated: Secondary | ICD-10-CM | POA: Diagnosis not present

## 2022-08-16 DIAGNOSIS — I110 Hypertensive heart disease with heart failure: Secondary | ICD-10-CM | POA: Diagnosis not present

## 2022-08-16 DIAGNOSIS — R9430 Abnormal result of cardiovascular function study, unspecified: Secondary | ICD-10-CM | POA: Diagnosis not present

## 2022-08-16 DIAGNOSIS — I5020 Unspecified systolic (congestive) heart failure: Secondary | ICD-10-CM | POA: Diagnosis not present

## 2022-08-22 ENCOUNTER — Other Ambulatory Visit: Payer: Self-pay | Admitting: Internal Medicine

## 2022-08-30 DIAGNOSIS — C3412 Malignant neoplasm of upper lobe, left bronchus or lung: Secondary | ICD-10-CM | POA: Diagnosis not present

## 2022-08-30 DIAGNOSIS — R911 Solitary pulmonary nodule: Secondary | ICD-10-CM | POA: Diagnosis not present

## 2022-09-10 DIAGNOSIS — Z419 Encounter for procedure for purposes other than remedying health state, unspecified: Secondary | ICD-10-CM | POA: Diagnosis not present

## 2022-10-10 DIAGNOSIS — Z419 Encounter for procedure for purposes other than remedying health state, unspecified: Secondary | ICD-10-CM | POA: Diagnosis not present

## 2022-10-31 DIAGNOSIS — C3412 Malignant neoplasm of upper lobe, left bronchus or lung: Secondary | ICD-10-CM | POA: Diagnosis not present

## 2022-11-01 DIAGNOSIS — R911 Solitary pulmonary nodule: Secondary | ICD-10-CM | POA: Diagnosis not present

## 2022-11-10 DIAGNOSIS — Z419 Encounter for procedure for purposes other than remedying health state, unspecified: Secondary | ICD-10-CM | POA: Diagnosis not present

## 2022-11-10 DIAGNOSIS — R0989 Other specified symptoms and signs involving the circulatory and respiratory systems: Secondary | ICD-10-CM | POA: Diagnosis not present

## 2022-11-20 ENCOUNTER — Other Ambulatory Visit: Payer: Self-pay | Admitting: Internal Medicine

## 2022-12-06 ENCOUNTER — Telehealth: Payer: Self-pay | Admitting: *Deleted

## 2022-12-06 NOTE — Patient Outreach (Signed)
  Care Management   Note  12/06/2022 Name: Kendra Mullins MRN: [id number] DOB: October 31, 1960  Kendra Mullins is enrolled in a Managed Medicaid plan: Yes. Outreach attempt today was unsuccessful.   A HIPPA compliant phone message was left for the patient providing contact information and requesting a return call.   Estanislado Emms RN, BSN [place]  [facility] RN Care Coordinator [phone]

## 2022-12-11 DIAGNOSIS — Z419 Encounter for procedure for purposes other than remedying health state, unspecified: Secondary | ICD-10-CM | POA: Diagnosis not present

## 2022-12-20 ENCOUNTER — Other Ambulatory Visit: Payer: Self-pay | Admitting: Internal Medicine

## 2022-12-20 DIAGNOSIS — I10 Essential (primary) hypertension: Secondary | ICD-10-CM

## 2022-12-29 DIAGNOSIS — Z789 Other specified health status: Secondary | ICD-10-CM | POA: Diagnosis not present

## 2022-12-29 DIAGNOSIS — I110 Hypertensive heart disease with heart failure: Secondary | ICD-10-CM | POA: Diagnosis not present

## 2022-12-29 DIAGNOSIS — F1090 Alcohol use, unspecified, uncomplicated: Secondary | ICD-10-CM | POA: Diagnosis not present

## 2022-12-29 DIAGNOSIS — F17210 Nicotine dependence, cigarettes, uncomplicated: Secondary | ICD-10-CM | POA: Diagnosis not present

## 2022-12-29 DIAGNOSIS — I6522 Occlusion and stenosis of left carotid artery: Secondary | ICD-10-CM | POA: Diagnosis not present

## 2022-12-29 DIAGNOSIS — F17200 Nicotine dependence, unspecified, uncomplicated: Secondary | ICD-10-CM | POA: Diagnosis not present

## 2022-12-29 DIAGNOSIS — I5020 Unspecified systolic (congestive) heart failure: Secondary | ICD-10-CM | POA: Diagnosis not present

## 2022-12-29 DIAGNOSIS — I771 Stricture of artery: Secondary | ICD-10-CM | POA: Diagnosis not present

## 2022-12-29 DIAGNOSIS — I10 Essential (primary) hypertension: Secondary | ICD-10-CM | POA: Diagnosis not present

## 2023-01-10 DIAGNOSIS — Z419 Encounter for procedure for purposes other than remedying health state, unspecified: Secondary | ICD-10-CM | POA: Diagnosis not present

## 2023-01-13 DIAGNOSIS — Z91018 Allergy to other foods: Secondary | ICD-10-CM | POA: Diagnosis not present

## 2023-01-13 DIAGNOSIS — I959 Hypotension, unspecified: Secondary | ICD-10-CM | POA: Diagnosis not present

## 2023-01-13 DIAGNOSIS — J449 Chronic obstructive pulmonary disease, unspecified: Secondary | ICD-10-CM | POA: Diagnosis not present

## 2023-01-13 DIAGNOSIS — F17200 Nicotine dependence, unspecified, uncomplicated: Secondary | ICD-10-CM | POA: Diagnosis not present

## 2023-01-13 DIAGNOSIS — M7022 Olecranon bursitis, left elbow: Secondary | ICD-10-CM | POA: Diagnosis not present

## 2023-01-13 DIAGNOSIS — Z881 Allergy status to other antibiotic agents status: Secondary | ICD-10-CM | POA: Diagnosis not present

## 2023-01-13 DIAGNOSIS — I110 Hypertensive heart disease with heart failure: Secondary | ICD-10-CM | POA: Diagnosis not present

## 2023-01-13 DIAGNOSIS — I5022 Chronic systolic (congestive) heart failure: Secondary | ICD-10-CM | POA: Diagnosis not present

## 2023-01-13 DIAGNOSIS — Z885 Allergy status to narcotic agent status: Secondary | ICD-10-CM | POA: Diagnosis not present

## 2023-01-13 DIAGNOSIS — R42 Dizziness and giddiness: Secondary | ICD-10-CM | POA: Diagnosis not present

## 2023-01-14 DIAGNOSIS — M7022 Olecranon bursitis, left elbow: Secondary | ICD-10-CM | POA: Diagnosis not present

## 2023-01-20 DIAGNOSIS — F411 Generalized anxiety disorder: Secondary | ICD-10-CM | POA: Diagnosis not present

## 2023-01-20 DIAGNOSIS — F341 Dysthymic disorder: Secondary | ICD-10-CM | POA: Diagnosis not present

## 2023-01-20 DIAGNOSIS — F410 Panic disorder [episodic paroxysmal anxiety] without agoraphobia: Secondary | ICD-10-CM | POA: Diagnosis not present

## 2023-01-20 DIAGNOSIS — F331 Major depressive disorder, recurrent, moderate: Secondary | ICD-10-CM | POA: Diagnosis not present

## 2023-02-01 ENCOUNTER — Inpatient Hospital Stay: Payer: Medicaid Other | Admitting: Internal Medicine

## 2023-02-03 DIAGNOSIS — I739 Peripheral vascular disease, unspecified: Secondary | ICD-10-CM | POA: Diagnosis not present

## 2023-02-03 DIAGNOSIS — I5020 Unspecified systolic (congestive) heart failure: Secondary | ICD-10-CM | POA: Diagnosis not present

## 2023-02-03 DIAGNOSIS — I10 Essential (primary) hypertension: Secondary | ICD-10-CM | POA: Diagnosis not present

## 2023-02-03 DIAGNOSIS — I340 Nonrheumatic mitral (valve) insufficiency: Secondary | ICD-10-CM | POA: Diagnosis not present

## 2023-02-10 DIAGNOSIS — Z419 Encounter for procedure for purposes other than remedying health state, unspecified: Secondary | ICD-10-CM | POA: Diagnosis not present

## 2023-02-22 DIAGNOSIS — I10 Essential (primary) hypertension: Secondary | ICD-10-CM | POA: Diagnosis not present

## 2023-02-22 DIAGNOSIS — R0602 Shortness of breath: Secondary | ICD-10-CM | POA: Diagnosis not present

## 2023-02-22 DIAGNOSIS — I509 Heart failure, unspecified: Secondary | ICD-10-CM | POA: Diagnosis not present

## 2023-03-02 ENCOUNTER — Ambulatory Visit: Payer: Medicaid Other | Admitting: Family Medicine

## 2023-03-03 ENCOUNTER — Ambulatory Visit (INDEPENDENT_AMBULATORY_CARE_PROVIDER_SITE_OTHER): Payer: Medicaid Other | Admitting: Internal Medicine

## 2023-03-03 ENCOUNTER — Encounter: Payer: Self-pay | Admitting: Internal Medicine

## 2023-03-03 VITALS — BP 128/72 | HR 125 | Temp 98.3°F | Ht 65.0 in | Wt 86.0 lb

## 2023-03-03 DIAGNOSIS — R112 Nausea with vomiting, unspecified: Secondary | ICD-10-CM

## 2023-03-03 DIAGNOSIS — R0602 Shortness of breath: Secondary | ICD-10-CM

## 2023-03-03 DIAGNOSIS — I700 Atherosclerosis of aorta: Secondary | ICD-10-CM | POA: Diagnosis not present

## 2023-03-03 DIAGNOSIS — F1020 Alcohol dependence, uncomplicated: Secondary | ICD-10-CM

## 2023-03-03 DIAGNOSIS — I4891 Unspecified atrial fibrillation: Secondary | ICD-10-CM

## 2023-03-03 DIAGNOSIS — J441 Chronic obstructive pulmonary disease with (acute) exacerbation: Secondary | ICD-10-CM

## 2023-03-03 DIAGNOSIS — I5021 Acute systolic (congestive) heart failure: Secondary | ICD-10-CM

## 2023-03-03 LAB — POC COVID19 BINAXNOW: SARS Coronavirus 2 Ag: NEGATIVE

## 2023-03-03 MED ORDER — PREDNISONE 10 MG PO TABS
ORAL_TABLET | ORAL | 0 refills | Status: AC
Start: 2023-03-03 — End: 2023-03-08

## 2023-03-03 MED ORDER — DOXYCYCLINE HYCLATE 100 MG PO TABS
100.0000 mg | ORAL_TABLET | Freq: Two times a day (BID) | ORAL | 0 refills | Status: AC
Start: 2023-03-03 — End: 2023-03-13

## 2023-03-03 MED ORDER — ONDANSETRON HCL 4 MG PO TABS: 4.0000 mg | ORAL_TABLET | Freq: Three times a day (TID) | ORAL | 0 refills | Status: AC | PRN

## 2023-03-03 MED ORDER — GUAIFENESIN-CODEINE 100-10 MG/5ML PO SOLN
5.0000 mL | Freq: Four times a day (QID) | ORAL | 0 refills | Status: DC | PRN
Start: 2023-03-03 — End: 2023-11-21

## 2023-03-03 NOTE — Assessment & Plan Note (Signed)
Being followed by Cardiology

## 2023-03-03 NOTE — Progress Notes (Signed)
Date:  03/03/2023   Name:  Kendra Mullins   DOB:  03/01/1961   MRN:  [id number]   Chief Complaint: Cough (Cough, Sob, Headache, Right ear pain. Started 3 days ago. )  Cough This is a new problem. The problem has been gradually worsening. The problem occurs every few minutes. The cough is Productive of sputum. Associated symptoms include ear pain, headaches, shortness of breath and wheezing. Pertinent negatives include no chest pain, chills or fever.    Review of Systems  Constitutional:  Positive for fatigue. Negative for chills, diaphoresis and fever.  HENT:  Positive for ear pain.   Eyes:  Negative for visual disturbance.  Respiratory:  Positive for cough, shortness of breath and wheezing.   Cardiovascular:  Positive for palpitations. Negative for chest pain and leg swelling.  Gastrointestinal:  Positive for nausea and vomiting. Negative for constipation.  Neurological:  Positive for headaches.  Psychiatric/Behavioral:  Positive for sleep disturbance. Negative for dysphoric mood. The patient is not nervous/anxious.      Lab Results  Component Value Date   NA 137 01/14/2022   K 4.1 01/14/2022   CO2 27 01/14/2022   GLUCOSE 91 01/14/2022   BUN 10 01/14/2022   CREATININE 0.62 01/14/2022   CALCIUM 9.5 01/14/2022   EGFR 101 01/14/2022   GFRNONAA 102 10/25/2019   Lab Results  Component Value Date   CHOL 166 01/14/2022   HDL 109 01/14/2022   LDLCALC 48 01/14/2022   TRIG 35 01/14/2022   CHOLHDL 1.5 01/14/2022   Lab Results  Component Value Date   TSH 1.490 01/14/2022   No results found for: "HGBA1C" Lab Results  Component Value Date   WBC 7.2 01/14/2022   HGB 12.8 01/14/2022   HCT 37.1 01/14/2022   MCV 93 01/14/2022   PLT 335 01/14/2022   Lab Results  Component Value Date   ALT 30 01/14/2022   AST 29 01/14/2022   ALKPHOS 125 (H) 01/14/2022   BILITOT 0.3 01/14/2022   Lab Results  Component Value Date   VD25OH 17.7 (L) 05/04/2021     Patient Active  Problem List   Diagnosis Date Noted   Acute heart failure with reduced ejection fraction (HFrEF, <= 40%) (HCC) 03/03/2023   Atrial fibrillation with RVR (HCC) 07/26/2022   Syncope 07/26/2022   S/P total hysterectomy 05/19/2020   NSCLC of left lung (HCC) 01/29/2020   Closed fracture of head of left humerus 05/15/2019   Aortic atherosclerosis (HCC) 12/31/2017   TIA (transient ischemic attack) 06/25/2017   Localized primary osteoarthritis of hands, bilateral 05/02/2017   Alcohol use disorder, severe, dependence (HCC) 09/15/2014   Anxiety and depression 09/15/2014   Tobacco use disorder 09/15/2014   Essential hypertension 09/15/2014   Hot flash, menopausal 09/15/2014   MI (mitral incompetence) 09/15/2014   Centrilobular emphysema (HCC) 09/15/2014   Low weight 09/15/2014    Allergies  Allergen Reactions   Azithromycin Nausea Only    Past Surgical History:  Procedure Laterality Date   TOTAL ABDOMINAL HYSTERECTOMY  1986   one ovary remains    Social History   Tobacco Use   Smoking status: Every Day    Current packs/day: 0.50    Average packs/day: 0.5 packs/day for 40.0 years (20.0 ttl pk-yrs)    Types: Cigarettes   Smokeless tobacco: Never   Tobacco comments:    Planning to quit on father's day  Vaping Use   Vaping status: Every Day  Substance Use Topics   Alcohol use:  Yes    Alcohol/week: 50.0 standard drinks of alcohol    Types: 50 Standard drinks or equivalent per week   Drug use: No     Medication list has been reviewed and updated.  Current Meds  Medication Sig   albuterol (PROVENTIL) (2.5 MG/3ML) 0.083% nebulizer solution Take 3 mLs (2.5 mg total) by nebulization every 6 (six) hours as needed for wheezing or shortness of breath.   albuterol (VENTOLIN HFA) 108 (90 Base) MCG/ACT inhaler INHALE 2 PUFFS INTO THE LUNGS EVERY 6 HOURS AS NEEDED FOR WHEEZING OR SHORTNESS OF BREATH   ALPRAZolam (XANAX) 0.5 MG tablet Take 0.5 mg by mouth 2 (two) times daily as needed  for anxiety.   ASPIRIN LOW DOSE 81 MG EC tablet TAKE 1 TABLET BY MOUTH ONCE DAILY   atorvastatin (LIPITOR) 40 MG tablet TAKE 1 TABLET(40 MG) BY MOUTH DAILY   ATROVENT HFA 17 MCG/ACT inhaler INHALE 2 PUFFS BY MOUTH EVERY 6 HOURS AS NEEDED FOR WHEEZING   budesonide-formoterol (SYMBICORT) 80-4.5 MCG/ACT inhaler Inhale into the lungs.   carvedilol (COREG) 6.25 MG tablet Take 6.25 mg by mouth 2 (two) times daily with a meal.   doxycycline (VIBRA-TABS) 100 MG tablet Take 1 tablet (100 mg total) by mouth 2 (two) times daily for 10 days.   empagliflozin (JARDIANCE) 10 MG TABS tablet Take 1 tablet by mouth daily.   ENTRESTO 24-26 MG Take 1 tablet by mouth 2 (two) times daily.   fluticasone (FLONASE) 50 MCG/ACT nasal spray SHAKE LIQUID AND USE 2 SPRAYS IN EACH NOSTRIL DAILY   folic acid (FOLVITE) 1 MG tablet Take 1 mg by mouth daily.   GNP NICOTINE POLACRILEX 4 MG lozenge Take by mouth.   guaiFENesin-codeine 100-10 MG/5ML syrup Take 5 mLs by mouth every 6 (six) hours as needed for cough.   LORazepam (ATIVAN) 0.5 MG tablet Take 0.5 mg by mouth daily.   naltrexone (DEPADE) 50 MG tablet Take 1 tablet by mouth daily.   Nebulizers (VIOS AEROSOL DELIVERY SYSTEM) MISC See admin instructions.   nicotine (NICODERM CQ - DOSED IN MG/24 HOURS) 21 mg/24hr patch Place onto the skin.   ondansetron (ZOFRAN) 4 MG tablet Take 1 tablet (4 mg total) by mouth every 8 (eight) hours as needed for nausea or vomiting.   polyethylene glycol (MIRALAX / GLYCOLAX) 17 g packet Take by mouth.   predniSONE (DELTASONE) 10 MG tablet Take 6 tablets (60 mg total) by mouth daily with breakfast for 1 day, THEN 5 tablets (50 mg total) daily with breakfast for 1 day, THEN 4 tablets (40 mg total) daily with breakfast for 1 day, THEN 3 tablets (30 mg total) daily with breakfast for 1 day, THEN 2 tablets (20 mg total) daily with breakfast for 1 day, THEN 1 tablet (10 mg total) daily with breakfast for 1 day.   sertraline (ZOLOFT) 50 MG tablet Take  50 mg by mouth daily. [name]   spironolactone (ALDACTONE) 25 MG tablet Take 1 tablet by mouth daily.   thiamine (VITAMIN B1) 100 MG tablet Take by mouth.   [DISCONTINUED] benazepril (LOTENSIN) 5 MG tablet TAKE 1 TABLET(5 MG) BY MOUTH DAILY   [DISCONTINUED] buPROPion (WELLBUTRIN XL) 150 MG 24 hr tablet Take 150 mg by mouth every morning.       03/03/2023    4:13 PM 03/21/2022    4:28 PM 02/22/2022    1:34 PM 01/14/2022    3:00 PM  GAD 7 : Generalized Anxiety Score  Nervous, Anxious, on Edge 0  1 0 1  Control/stop worrying 1 1 1 1   Worry too much - different things 1 1 1 1   Trouble relaxing 2 1 1 1   Restless 2 1 0 0  Easily annoyed or irritable 2 0 1 1  Afraid - awful might happen 0 0 1 1  Total GAD 7 Score 8 5 5 6   Anxiety Difficulty Extremely difficult Somewhat difficult Somewhat difficult Somewhat difficult       03/03/2023    4:13 PM 03/21/2022    4:28 PM 02/22/2022    1:34 PM  Depression screen PHQ 2/9  Decreased Interest 3 2 1   Down, Depressed, Hopeless 2 1 1   PHQ - 2 Score 5 3 2   Altered sleeping 3 0 1  Tired, decreased energy 3 2 1   Change in appetite 3 0 0  Feeling bad or failure about yourself  1 0 0  Trouble concentrating 2 1 1   Moving slowly or fidgety/restless 2 0 0  Suicidal thoughts 0 0 0  PHQ-9 Score 19 6 5   Difficult doing work/chores Extremely dIfficult Somewhat difficult Somewhat difficult    BP Readings from Last 3 Encounters:  03/03/23 128/72  06/28/22 136/76  03/21/22 136/62    Physical Exam Constitutional:      Appearance: She is ill-appearing.  HENT:     Right Ear: Tympanic membrane normal.     Left Ear: Tympanic membrane normal.     Nose:     Right Sinus: No maxillary sinus tenderness.     Left Sinus: No maxillary sinus tenderness.  Cardiovascular:     Rate and Rhythm: Normal rate and regular rhythm.     Heart sounds: No murmur heard. Pulmonary:     Effort: Pulmonary effort is normal.     Breath sounds: Wheezing and rhonchi  present.  Musculoskeletal:     Cervical back: Normal range of motion.     Right lower leg: No edema.     Left lower leg: No edema.  Neurological:     Mental Status: She is alert.  Psychiatric:        Mood and Affect: Mood normal.        Behavior: Behavior normal.     Wt Readings from Last 3 Encounters:  03/03/23 86 lb (39 kg)  06/28/22 86 lb 9.6 oz (39.3 kg)  03/21/22 90 lb (40.8 kg)    BP 128/72   Pulse (!) 125   Temp 98.3 F (36.8 C) (Oral)   Ht 5\' 5"  (1.651 m)   Wt 86 lb (39 kg)   SpO2 (!) 89%   BMI 14.31 kg/m   Assessment and Plan:  Problem List Items Addressed This Visit       Unprioritized   Acute heart failure with reduced ejection fraction (HFrEF, <= 40%) (HCC)   Alcohol use disorder, severe, dependence (HCC)    Prescribed Naltrexone during recent hospital stay but not taking it. She has been drinking up until 2 days ago when she became ill. Recommend holding Naltrexone while taking Robitussin and Doxycycline      Aortic atherosclerosis (HCC) (Chronic)    Being followed by Cardiology      Atrial fibrillation with RVR (HCC)    Rate now controlled Has LHC pending for apical aneurysm?  Will need to be on hold until acute illness has resolved.      Other Visit Diagnoses     Acute exacerbation of chronic obstructive pulmonary disease (COPD) (HCC)    -  Primary  Relevant Medications   doxycycline (VIBRA-TABS) 100 MG tablet   predniSONE (DELTASONE) 10 MG tablet   guaiFENesin-codeine 100-10 MG/5ML syrup   Shortness of breath       Relevant Orders   POC COVID-19 BinaxNow   Nausea and vomiting, unspecified vomiting type       Relevant Medications   ondansetron (ZOFRAN) 4 MG tablet       No follow-ups on file.    Reubin Milan, MD [facility] [place]

## 2023-03-03 NOTE — Patient Instructions (Addendum)
Use the nebulizer at least twice to three times per day.  Take the prednisone taper as directed.  Take Doxycycline twice a day for 10 days.  Do not start the Naltrexone while you are taking the codeine cough syrup.

## 2023-03-03 NOTE — Assessment & Plan Note (Signed)
Prescribed Naltrexone during recent hospital stay but not taking it. She has been drinking up until 2 days ago when she became ill. Recommend holding Naltrexone while taking Robitussin and Doxycycline

## 2023-03-03 NOTE — Assessment & Plan Note (Addendum)
Rate now controlled Has LHC pending for apical aneurysm?  Will need to be on hold until acute illness has resolved.

## 2023-03-12 DIAGNOSIS — Z419 Encounter for procedure for purposes other than remedying health state, unspecified: Secondary | ICD-10-CM | POA: Diagnosis not present

## 2023-03-23 ENCOUNTER — Other Ambulatory Visit: Payer: Self-pay | Admitting: Family Medicine

## 2023-03-23 ENCOUNTER — Ambulatory Visit (INDEPENDENT_AMBULATORY_CARE_PROVIDER_SITE_OTHER): Payer: Medicaid Other | Admitting: Family Medicine

## 2023-03-23 ENCOUNTER — Encounter: Payer: Self-pay | Admitting: Family Medicine

## 2023-03-23 VITALS — BP 120/60 | HR 56 | Ht 65.0 in | Wt 85.0 lb

## 2023-03-23 DIAGNOSIS — N3090 Cystitis, unspecified without hematuria: Secondary | ICD-10-CM

## 2023-03-23 DIAGNOSIS — Z23 Encounter for immunization: Secondary | ICD-10-CM | POA: Diagnosis not present

## 2023-03-23 LAB — POCT URINALYSIS DIPSTICK
Bilirubin, UA: POSITIVE
Glucose, UA: POSITIVE — AB
Ketones, UA: NEGATIVE
Nitrite, UA: POSITIVE
Protein, UA: POSITIVE — AB
Spec Grav, UA: 1.015 (ref 1.010–1.025)
Urobilinogen, UA: 0.2 U/dL
pH, UA: 7 (ref 5.0–8.0)

## 2023-03-23 MED ORDER — FLUCONAZOLE 150 MG PO TABS: 150.0000 mg | ORAL_TABLET | Freq: Once | ORAL | 0 refills | Status: AC

## 2023-03-23 MED ORDER — AMOXICILLIN-POT CLAVULANATE 500-125 MG PO TABS: 1.0000 | ORAL_TABLET | Freq: Two times a day (BID) | ORAL | 0 refills | Status: AC

## 2023-03-23 NOTE — Assessment & Plan Note (Signed)
History of Present Illness The patient, with a history of heart disease and an upcoming cardiac catheterization, presents with urinary symptoms for the past week. She describes frequent urination, burning, and pain, with some pressure. She also reports some abdominal discomfort and flank pain, but denies fever, chills, or discharge. She has been self-medicating with AZO, which has provided some relief.  Results  LABS Urine dipstick: Positive glucose, 2+ blood, positive protein, positive nitrites, 2+ leukocytes (03/23/2023) Urine culture: Proteus species (02/2022)  Assessment & Plan Urinary Tract Infection - simple cystitis Symptoms of frequency, dysuria, and abdominal pain for one week. Urine dipstick positive for glucose, blood, protein, nitrites, and leukocytes. Previous culture grew Proteus with variable sensitivity/resistance. -Start Augmentin, take for the full course -Advise to drink plenty of water. -Advise to continue Azo for pain relief. -Provide Fluconazole (Diflucan) as a standby medication in case of yeast infection.  Influenza Vaccination No recent flu shot. -Administer flu shot today.  Upcoming Cardiac Catheterization Scheduled for next Tuesday. -Advise to inform the cardiology team about the current UTI and antibiotic treatment.

## 2023-03-23 NOTE — Progress Notes (Signed)
     Primary Care / Sports Medicine Office Visit  Patient Information:  Patient ID: Kendra Mullins, female DOB: May 17, 1960 Age: 62 y.o. MRN: [id number]   Kendra Mullins is a pleasant 62 y.o. female presenting with the following:  Chief Complaint  Patient presents with   Urinary Tract Infection    Patient here for UTI symptoms x 1 week painful urination with frequency.     Vitals:   03/23/23 1408  BP: 120/60  Pulse: (!) 56  SpO2: 94%   Vitals:   03/23/23 1408  Weight: 85 lb (38.6 kg)  Height: 5\' 5"  (1.651 m)   Body mass index is 14.14 kg/m.  No results found.   Independent interpretation of notes and tests performed by another provider:   None  Procedures performed:   None  Pertinent History, Exam, Impression, and Recommendations:   Problem List Items Addressed This Visit       Genitourinary   Cystitis - Primary   History of Present Illness The patient, with a history of heart disease and an upcoming cardiac catheterization, presents with urinary symptoms for the past week. She describes frequent urination, burning, and pain, with some pressure. She also reports some abdominal discomfort and flank pain, but denies fever, chills, or discharge. She has been self-medicating with AZO, which has provided some relief.  Results  LABS Urine dipstick: Positive glucose, 2+ blood, positive protein, positive nitrites, 2+ leukocytes (03/23/2023) Urine culture: Proteus species (02/2022)  Assessment & Plan Urinary Tract Infection - simple cystitis Symptoms of frequency, dysuria, and abdominal pain for one week. Urine dipstick positive for glucose, blood, protein, nitrites, and leukocytes. Previous culture grew Proteus with variable sensitivity/resistance. -Start Augmentin, take for the full course -Advise to drink plenty of water. -Advise to continue Azo for pain relief. -Provide Fluconazole (Diflucan) as a standby medication in case of yeast infection.  Influenza  Vaccination No recent flu shot. -Administer flu shot today.  Upcoming Cardiac Catheterization Scheduled for next Tuesday. -Advise to inform the cardiology team about the current UTI and antibiotic treatment.      Relevant Orders   POCT urinalysis dipstick     Orders & Medications Medications:  Meds ordered this encounter  Medications   amoxicillin-clavulanate (AUGMENTIN) 500-125 MG tablet    Sig: Take 1 tablet by mouth in the morning and at bedtime for 7 days.    Dispense:  14 tablet    Refill:  0   fluconazole (DIFLUCAN) 150 MG tablet    Sig: Take 1 tablet (150 mg total) by mouth once for 1 dose. If yeast infection symptoms develop    Dispense:  1 tablet    Refill:  0   Orders Placed This Encounter  Procedures   POCT urinalysis dipstick     No follow-ups on file.     Jerrol Banana, MD, [facility]   Primary Care Sports Medicine Primary Care and Sports Medicine at [facility]

## 2023-03-23 NOTE — Patient Instructions (Signed)
**  VISIT SUMMARY**  Today, we addressed your urinary symptoms, discussed your upcoming cardiac catheterization, and administered your flu shot.  **YOUR PLAN**  - **Urinary Tract Infection:**   - Start Augmentin (antibiotic) to treat the infection.   - Drink plenty of water.   - Continue taking Azo for pain relief.   - Use Fluconazole (Diflucan) if you develop a yeast infection.  - **Influenza Vaccination:**   - You received your flu shot today.  - **Upcoming Cardiac Catheterization:**   - Scheduled for next Tuesday.   - Inform your cardiology team about your UTI and antibiotic treatment.  **INSTRUCTIONS**  - Follow up with your cardiology team about your cardiac catheterization and inform them about your UTI and antibiotic treatment. - Contact our office if you develop new symptoms or if your symptoms worsen.

## 2023-03-23 NOTE — Addendum Note (Signed)
Addended by: Jerald Kief on: 03/23/2023 03:25 PM   Modules accepted: Orders

## 2023-03-28 DIAGNOSIS — J449 Chronic obstructive pulmonary disease, unspecified: Secondary | ICD-10-CM | POA: Diagnosis not present

## 2023-03-28 DIAGNOSIS — I4891 Unspecified atrial fibrillation: Secondary | ICD-10-CM | POA: Diagnosis not present

## 2023-03-28 DIAGNOSIS — I6522 Occlusion and stenosis of left carotid artery: Secondary | ICD-10-CM | POA: Diagnosis not present

## 2023-03-28 DIAGNOSIS — I10 Essential (primary) hypertension: Secondary | ICD-10-CM | POA: Diagnosis not present

## 2023-03-28 DIAGNOSIS — I771 Stricture of artery: Secondary | ICD-10-CM | POA: Diagnosis not present

## 2023-03-28 DIAGNOSIS — F17210 Nicotine dependence, cigarettes, uncomplicated: Secondary | ICD-10-CM | POA: Diagnosis not present

## 2023-03-28 DIAGNOSIS — Z8673 Personal history of transient ischemic attack (TIA), and cerebral infarction without residual deficits: Secondary | ICD-10-CM | POA: Diagnosis not present

## 2023-03-28 DIAGNOSIS — I5022 Chronic systolic (congestive) heart failure: Secondary | ICD-10-CM | POA: Diagnosis not present

## 2023-03-29 LAB — URINE CULTURE

## 2023-04-12 DIAGNOSIS — Z419 Encounter for procedure for purposes other than remedying health state, unspecified: Secondary | ICD-10-CM | POA: Diagnosis not present

## 2023-05-12 ENCOUNTER — Other Ambulatory Visit: Payer: Self-pay | Admitting: Internal Medicine

## 2023-05-12 DIAGNOSIS — I700 Atherosclerosis of aorta: Secondary | ICD-10-CM

## 2023-05-12 MED ORDER — ATORVASTATIN CALCIUM 40 MG PO TABS: 40.0000 mg | ORAL_TABLET | Freq: Every day | ORAL | 1 refills | Status: DC

## 2023-05-12 MED ORDER — FLUTICASONE PROPIONATE 50 MCG/ACT NA SUSP: 2.0000 | Freq: Every day | NASAL | 0 refills | Status: DC

## 2023-05-12 MED ORDER — BUDESONIDE-FORMOTEROL FUMARATE 80-4.5 MCG/ACT IN AERO: 2.0000 | INHALATION_SPRAY | Freq: Every day | RESPIRATORY_TRACT | 0 refills | Status: DC

## 2023-05-12 NOTE — Telephone Encounter (Signed)
Requested Prescriptions  Pending Prescriptions Disp Refills   atorvastatin (LIPITOR) 40 MG tablet 30 tablet 5     Cardiovascular:  Antilipid - Statins Failed - 05/12/2023  3:43 PM      Failed - Lipid Panel in normal range within the last 12 months    Cholesterol, Total  Date Value Ref Range Status  01/14/2022 166 100 - 199 mg/dL Final   LDL Chol Calc (NIH)  Date Value Ref Range Status  01/14/2022 48 0 - 99 mg/dL Final   HDL  Date Value Ref Range Status  01/14/2022 109 >39 mg/dL Final   Triglycerides  Date Value Ref Range Status  01/14/2022 35 0 - 149 mg/dL Final         Passed - Patient is not pregnant      Passed - Valid encounter within last 12 months    Recent Outpatient Visits           1 month ago Cystitis   [place] [facility] at [facility] [place] [facility], Ocie Bob, MD   2 months ago Acute exacerbation of chronic obstructive pulmonary disease (COPD) (HCC)   [place] [facility] at [facility], Nyoka Cowden, MD   10 months ago Acute maxillary sinusitis, recurrence not specified   [place] [facility], Ocie Bob, MD   10 months ago Acute maxillary sinusitis, recurrence not specified   [place] [facility] at [facility], Ocie Bob, MD   1 year ago Flank pain   [facility] at [facility], Nyoka Cowden, MD               fluticasone [facility]) 50 MCG/ACT nasal spray 48 g 0    Sig: Place 2 sprays into both nostrils daily.     Ear, Nose, and Throat: Nasal Preparations - Corticosteroids Passed - 05/12/2023  3:43 PM      Passed - Valid encounter within last 12 months    Recent Outpatient Visits           1 month ago Cystitis   [place] [facility] at [facility] [place] [facility], Ocie Bob, MD   2 months ago Acute exacerbation of chronic obstructive pulmonary disease (COPD)  (HCC)   [place] [facility] at [facility], Nyoka Cowden, MD   10 months ago Acute maxillary sinusitis, recurrence not specified   [place] [facility] at [facility], Ocie Bob, MD   10 months ago Acute maxillary sinusitis, recurrence not specified   [place] [facility] at [facility], Ocie Bob, MD   1 year ago Flank pain   [place] [facility] at [facility], Nyoka Cowden, MD               budesonide-formoterol [facility]) 80-4.5 MCG/ACT inhaler 1 each     Sig: Inhale into the lungs.     Pulmonology:  Combination Products Passed - 05/12/2023  3:43 PM      Passed - Valid encounter within last 12 months    Recent Outpatient Visits           1 month ago Cystitis   [place] [facility] at [facility], Ocie Bob, MD   2 months ago Acute exacerbation of  chronic obstructive pulmonary disease (COPD) (HCC)   [place] [facility] at [facility], Nyoka Cowden, MD   10 months ago Acute maxillary sinusitis, recurrence not specified   [facility] at [facility], Ocie Bob, MD   10 months ago Acute maxillary sinusitis, recurrence not specified   [place] at [facility], Ocie Bob, MD   1 year ago Flank pain   [facility] at [facility], Nyoka Cowden, MD

## 2023-05-12 NOTE — Telephone Encounter (Signed)
Medication Refill -  Most Recent Primary Care Visit:  Provider: Jerrol Banana  Department: [facility] [place]  Visit Type: OFFICE VISIT  Date: 03/23/2023  Medication: atorvastatin (LIPITOR) 40 MG tablet [[id number]] fluticasone (FLONASE) 50 MCG/ACT nasal spray [[id number]] budesonide-formoterol (SYMBICORT) 80-4.5 MCG/ACT inhaler [[id number]]   Has the patient contacted their pharmacy? Yes  (Agent: If yes, when and what did the pharmacy advise?)  Is this the correct pharmacy for this prescription? No If no, delete pharmacy and type the correct one.  This is the patient's preferred pharmacy:  [facility] - [place], [place] - [address]    Has the prescription been filled recently? Yes  Is the patient out of the medication? Yes  Has the patient been seen for an appointment in the last year OR does the patient have an upcoming appointment? Yes  Can we respond through MyChart? Yes  Agent: Please be advised that Rx refills may take up to 3 business days. We ask that you follow-up with your pharmacy.

## 2023-05-12 NOTE — Telephone Encounter (Signed)
Requested medication (s) are due for refill today - yes  Requested medication (s) are on the active medication list -yes  Future visit scheduled -no  Last refill: Symbicort- historical medication                  Atorvastatin- 06/23/22 #30 5RF- fails lab protocol- over 1 year-01/14/22  Notes to clinic: see above  Requested Prescriptions  Pending Prescriptions Disp Refills   atorvastatin (LIPITOR) 40 MG tablet 30 tablet 5     Cardiovascular:  Antilipid - Statins Failed - 05/12/2023  4:04 PM      Failed - Lipid Panel in normal range within the last 12 months    Cholesterol, Total  Date Value Ref Range Status  01/14/2022 166 100 - 199 mg/dL Final   LDL Chol Calc (NIH)  Date Value Ref Range Status  01/14/2022 48 0 - 99 mg/dL Final   HDL  Date Value Ref Range Status  01/14/2022 109 >39 mg/dL Final   Triglycerides  Date Value Ref Range Status  01/14/2022 35 0 - 149 mg/dL Final         Passed - Patient is not pregnant      Passed - Valid encounter within last 12 months    Recent Outpatient Visits           1 month ago Cystitis   [place] [facility] Royalty, Ocie Bob, MD   2 months ago Acute exacerbation of chronic obstructive pulmonary disease (COPD) (HCC)   [place] [facility] at [facility], Nyoka Cowden, MD   10 months ago Acute maxillary sinusitis, recurrence not specified   [place] [facility], Ocie Bob, MD   10 months ago Acute maxillary sinusitis, recurrence not specified   [place] [facility] at [facility], Ocie Bob, MD   1 year ago Flank pain   [place] [facility] at [facility], Nyoka Cowden, MD               budesonide-formoterol [facility]) 80-4.5 MCG/ACT inhaler 1 each     Sig: Inhale into the lungs.     Pulmonology:  Combination Products Passed - 05/12/2023   4:04 PM      Passed - Valid encounter within last 12 months    Recent Outpatient Visits           1 month ago Cystitis   [place] [facility] Royalty, Ocie Bob, MD   2 months ago Acute exacerbation of chronic obstructive pulmonary disease (COPD) [facility])   [place] [facility] at [facility], Nyoka Cowden, MD   10 months ago Acute maxillary sinusitis, recurrence not specified   [place] [facility] at [facility], Ocie Bob, MD   10 months ago Acute maxillary sinusitis, recurrence not specified   [place] [facility] at [facility], Ocie Bob, MD   1 year ago Flank pain   [place] [facility] at [facility], Nyoka Cowden, MD              Signed Prescriptions Disp Refills   fluticasone (FLONASE) 50 MCG/ACT nasal spray 48 g 0    Sig: Place 2 sprays into both nostrils daily.  Ear, Nose, and Throat: Nasal Preparations - Corticosteroids Passed - 05/12/2023  4:04 PM      Passed - Valid encounter within last 12 months    Recent Outpatient Visits           1 month ago Cystitis   [place] [facility] Royalty, Ocie Bob, MD   2 months ago Acute exacerbation of chronic obstructive pulmonary disease (COPD) [facility])   [place] [facility] at [facility], Nyoka Cowden, MD   10 months ago Acute maxillary sinusitis, recurrence not specified   [place] [facility] at [facility], Ocie Bob, MD   10 months ago Acute maxillary sinusitis, recurrence not specified   [place] [facility] at [facility], Ocie Bob, MD   1 year ago Flank pain   [place] [facility] at [facility], Nyoka Cowden, MD                 Requested Prescriptions  Pending  Prescriptions Disp Refills   atorvastatin (LIPITOR) 40 MG tablet 30 tablet 5     Cardiovascular:  Antilipid - Statins Failed - 05/12/2023  4:04 PM      Failed - Lipid Panel in normal range within the last 12 months    Cholesterol, Total  Date Value Ref Range Status  01/14/2022 166 100 - 199 mg/dL Final   LDL Chol Calc (NIH)  Date Value Ref Range Status  01/14/2022 48 0 - 99 mg/dL Final   HDL  Date Value Ref Range Status  01/14/2022 109 >39 mg/dL Final   Triglycerides  Date Value Ref Range Status  01/14/2022 35 0 - 149 mg/dL Final         Passed - Patient is not pregnant      Passed - Valid encounter within last 12 months    Recent Outpatient Visits           1 month ago Cystitis   [place] [facility] Jerrol Banana, MD   2 months ago Acute exacerbation of chronic obstructive pulmonary disease (COPD) (HCC)   [place] [facility] at [facility], Nyoka Cowden, MD   10 months ago Acute maxillary sinusitis, recurrence not specified   [place] [facility] at [facility], Ocie Bob, MD   10 months ago Acute maxillary sinusitis, recurrence not specified   [place] [facility] at [facility], Ocie Bob, MD   1 year ago Flank pain   [place] [facility] at [facility], Nyoka Cowden, MD               budesonide-formoterol [facility]) 80-4.5 MCG/ACT inhaler 1 each     Sig: Inhale into the lungs.     Pulmonology:  Combination Products Passed - 05/12/2023  4:04 PM      Passed - Valid encounter within last 12 months    Recent Outpatient Visits           1 month ago Cystitis   [place] [facility] at [facility] Royalty, Ocie Bob, MD   2 months ago Acute exacerbation of chronic obstructive pulmonary disease (COPD) [facility])   [facility] at [facility], Nyoka Cowden,  MD   10 months ago Acute maxillary sinusitis, recurrence not specified   [place] [facility] at [facility], Ocie Bob, MD   10 months ago Acute maxillary sinusitis, recurrence not specified   [place] [facility] at [facility], Ocie Bob, MD   1 year ago Flank pain   [facility] at [facility], Nyoka Cowden, MD              Signed Prescriptions Disp Refills   fluticasone (FLONASE) 50 MCG/ACT nasal spray 48 g 0    Sig: Place 2 sprays into both nostrils daily.     Ear, Nose, and Throat: Nasal Preparations - Corticosteroids Passed - 05/12/2023  4:04 PM      Passed - Valid encounter within last 12 months    Recent Outpatient Visits           1 month ago Cystitis   [place] [facility] Royalty, Ocie Bob, MD   2 months ago Acute exacerbation of chronic obstructive pulmonary disease (COPD) (HCC)    [facility] at [facility], Nyoka Cowden, MD   10 months ago Acute maxillary sinusitis, recurrence not specified   [facility] at [facility], Ocie Bob, MD   10 months ago Acute maxillary sinusitis, recurrence not specified   [facility] at [facility], Ocie Bob, MD   1 year ago Flank pain   [facility] at [facility], Nyoka Cowden, MD

## 2023-05-13 DIAGNOSIS — Z419 Encounter for procedure for purposes other than remedying health state, unspecified: Secondary | ICD-10-CM | POA: Diagnosis not present

## 2023-05-16 ENCOUNTER — Other Ambulatory Visit: Payer: Self-pay | Admitting: Internal Medicine

## 2023-05-16 DIAGNOSIS — I700 Atherosclerosis of aorta: Secondary | ICD-10-CM

## 2023-05-16 MED ORDER — FLUTICASONE PROPIONATE 50 MCG/ACT NA SUSP: 2.0000 | Freq: Every day | NASAL | 0 refills | Status: DC

## 2023-05-16 MED ORDER — BUDESONIDE-FORMOTEROL FUMARATE 80-4.5 MCG/ACT IN AERO: 2.0000 | INHALATION_SPRAY | Freq: Every day | RESPIRATORY_TRACT | 0 refills | Status: DC

## 2023-05-16 NOTE — Telephone Encounter (Signed)
 Requested medication (s) are due for refill today: Yes  Requested medication (s) are on the active medication list: Yes  Last refill:  05/12/23 (Pharmacy did not receive)  Future visit scheduled: No  Notes to clinic:  Unable to refill per protocol due to failed labs, no updated results.     Requested Prescriptions  Pending Prescriptions Disp Refills   atorvastatin  (LIPITOR) 40 MG tablet 30 tablet 1    Sig: Take 1 tablet (40 mg total) by mouth daily.     Cardiovascular:  Antilipid - Statins Failed - 05/16/2023  2:45 PM      Failed - Lipid Panel in normal range within the last 12 months    Cholesterol, Total  Date Value Ref Range Status  01/14/2022 166 100 - 199 mg/dL Final   LDL Chol Calc (NIH)  Date Value Ref Range Status  01/14/2022 48 0 - 99 mg/dL Final   HDL  Date Value Ref Range Status  01/14/2022 109 >39 mg/dL Final   Triglycerides  Date Value Ref Range Status  01/14/2022 35 0 - 149 mg/dL Final         Passed - Patient is not pregnant      Passed - Valid encounter within last 12 months    Recent Outpatient Visits           1 month ago Cystitis   [place] [facility] Alvia, Selinda PARAS, MD   2 months ago Acute exacerbation of chronic obstructive pulmonary disease (COPD) (HCC)   [place] [facility] at [facility], Leita DEL, MD   10 months ago Acute maxillary sinusitis, recurrence not specified   [place] [facility], Selinda PARAS, MD   10 months ago Acute maxillary sinusitis, recurrence not specified   [place] [facility] at [facility], Selinda PARAS, MD   1 year ago Flank pain   [place] [facility] at [facility], Leita DEL, MD              Signed Prescriptions Disp Refills   budesonide -formoterol  (SYMBICORT ) 80-4.5 MCG/ACT inhaler 1 each 0    Sig: Inhale 2 puffs  into the lungs daily.     Pulmonology:  Combination Products Passed - 05/16/2023  2:45 PM      Passed - Valid encounter within last 12 months    Recent Outpatient Visits           1 month ago Cystitis   [place] [facility] Alvia, Selinda PARAS, MD   2 months ago Acute exacerbation of chronic obstructive pulmonary disease (COPD) [facility])   [place] [facility] at [facility], Leita DEL, MD   10 months ago Acute maxillary sinusitis, recurrence not specified    [facility] at [facility], Selinda PARAS, MD   10 months ago Acute maxillary sinusitis, recurrence not specified    [facility] Medicine at [facility], Selinda PARAS, MD   1 year ago Flank pain   [facility] at [facility], Leita DEL, MD               fluticasone  (FLONASE ) 50 MCG/ACT nasal spray 48 g 0    Sig: Place 2 sprays into both nostrils  daily.     Ear, Nose, and Throat: Nasal Preparations - Corticosteroids Passed - 05/16/2023  2:45 PM      Passed - Valid encounter within last 12 months    Recent Outpatient Visits           1 month ago Cystitis   [place] [facility] Alvia, Selinda PARAS, MD   2 months ago Acute exacerbation of chronic obstructive pulmonary disease (COPD) (HCC)   [place] [facility] at [facility], Leita DEL, MD   10 months ago Acute maxillary sinusitis, recurrence not specified   [facility] at [facility], Selinda PARAS, MD   10 months ago Acute maxillary sinusitis, recurrence not specified   [facility] Medicine at [facility], Selinda PARAS, MD   1 year ago Flank pain   [facility] at [facility], Leita DEL, MD

## 2023-05-16 NOTE — Telephone Encounter (Signed)
 Medication Refill -  Most Recent Primary Care Visit:  Provider: MATTHEWS, JASON J  Department: [facility]  Visit Type: OFFICE VISIT  Date: 03/23/2023  Medication: Patient states she spoke with pharmacy and they denied receiving the below prescriptions. Patient would like PCP to re send scripts in today and would like a follow up call when completed.  atorvastatin  (LIPITOR) 40 MG tablet  budesonide -formoterol  (SYMBICORT ) 80-4.5 MCG/ACT inhaler  fluticasone  (FLONASE ) 50 MCG/ACT nasal spray   [facility] - [place], [place] - [address] Phone: [phone]  Fax: [phone]

## 2023-05-18 ENCOUNTER — Telehealth: Payer: Self-pay | Admitting: Internal Medicine

## 2023-05-18 ENCOUNTER — Other Ambulatory Visit: Payer: Self-pay

## 2023-05-18 DIAGNOSIS — I700 Atherosclerosis of aorta: Secondary | ICD-10-CM

## 2023-05-18 MED ORDER — ATORVASTATIN CALCIUM 40 MG PO TABS: 40.0000 mg | ORAL_TABLET | Freq: Every day | ORAL | 1 refills | Status: AC

## 2023-05-18 MED ORDER — FLUTICASONE PROPIONATE 50 MCG/ACT NA SUSP: 2.0000 | Freq: Every day | NASAL | 0 refills | Status: AC

## 2023-05-18 MED ORDER — BUDESONIDE-FORMOTEROL FUMARATE 80-4.5 MCG/ACT IN AERO: 2.0000 | INHALATION_SPRAY | Freq: Every day | RESPIRATORY_TRACT | 0 refills | Status: DC

## 2023-05-18 NOTE — Telephone Encounter (Signed)
 Refill sent ? ?KP ?

## 2023-05-18 NOTE — Telephone Encounter (Signed)
 Copied from CRM [id number]. Topic: General - Other >> May 18, 2023  9:29 AM Rosaria BRAVO wrote: Reason for CRM: Drew from [facility] called to report that they never received the recent prescriptions from May 12 2023. They are missing the atorvastatin , symbicort , and flonase . Please advise

## 2023-05-20 ENCOUNTER — Other Ambulatory Visit: Payer: Self-pay | Admitting: Internal Medicine

## 2023-05-20 DIAGNOSIS — I700 Atherosclerosis of aorta: Secondary | ICD-10-CM

## 2023-05-22 DIAGNOSIS — R911 Solitary pulmonary nodule: Secondary | ICD-10-CM | POA: Diagnosis not present

## 2023-05-22 NOTE — Telephone Encounter (Signed)
 Requested medication (s) are due for refill today - no  Requested medication (s) are on the active medication list -yes  Future visit scheduled -no  Last refill: 05/18/23 #30 1RF  Notes to clinic: Request 90 day supply, fails lab protocol- over 1 year- 01/14/22  Requested Prescriptions  Pending Prescriptions Disp Refills   atorvastatin  (LIPITOR) 40 MG tablet [Pharmacy Med Name: ATORVASTATIN  40MG  TABLETS] 90 tablet     Sig: TAKE 1 TABLET(40 MG) BY MOUTH DAILY     Cardiovascular:  Antilipid - Statins Failed - 05/22/2023 12:24 PM      Failed - Lipid Panel in normal range within the last 12 months    Cholesterol, Total  Date Value Ref Range Status  01/14/2022 166 100 - 199 mg/dL Final   LDL Chol Calc (NIH)  Date Value Ref Range Status  01/14/2022 48 0 - 99 mg/dL Final   HDL  Date Value Ref Range Status  01/14/2022 109 >39 mg/dL Final   Triglycerides  Date Value Ref Range Status  01/14/2022 35 0 - 149 mg/dL Final         Passed - Patient is not pregnant      Passed - Valid encounter within last 12 months    Recent Outpatient Visits           2 months ago Cystitis   [place] [facility] [place] Ledger, Dessie Flow, MD   2 months ago Acute exacerbation of chronic obstructive pulmonary disease (COPD) (HCC)   [place] [facility] at [facility], Chales [place], MD   10 months ago Acute maxillary sinusitis, recurrence not specified   [place] [facility], Dessie Flow, MD   10 months ago Acute maxillary sinusitis, recurrence not specified   [place] [facility] at [facility], Dessie Flow, MD   1 year ago Flank pain   [place] [facility] at [facility], Chales [place], MD                 Requested Prescriptions  Pending Prescriptions Disp Refills   atorvastatin  (LIPITOR) 40 MG tablet [Pharmacy Med  Name: ATORVASTATIN  40MG  TABLETS] 90 tablet     Sig: TAKE 1 TABLET(40 MG) BY MOUTH DAILY     Cardiovascular:  Antilipid - Statins Failed - 05/22/2023 12:24 PM      Failed - Lipid Panel in normal range within the last 12 months    Cholesterol, Total  Date Value Ref Range Status  01/14/2022 166 100 - 199 mg/dL Final   LDL Chol Calc (NIH)  Date Value Ref Range Status  01/14/2022 48 0 - 99 mg/dL Final   HDL  Date Value Ref Range Status  01/14/2022 109 >39 mg/dL Final   Triglycerides  Date Value Ref Range Status  01/14/2022 35 0 - 149 mg/dL Final         Passed - Patient is not pregnant      Passed - Valid encounter within last 12 months    Recent Outpatient Visits           2 months ago Cystitis   [place] [facility] at [facility], MD   2 months ago Acute exacerbation of chronic obstructive pulmonary disease (COPD) [facility])   [place] [facility] at [facility], Chales [place], MD  10 months ago Acute maxillary sinusitis, recurrence not specified   [place] [facility] at [facility], Dessie Flow, MD   10 months ago Acute maxillary sinusitis, recurrence not specified   [facility] Care [facility] Medicine at [facility], Dessie Flow, MD   1 year ago Flank pain   [facility] Medicine at [facility], Chales [place], MD

## 2023-06-10 DIAGNOSIS — Z419 Encounter for procedure for purposes other than remedying health state, unspecified: Secondary | ICD-10-CM | POA: Diagnosis not present

## 2023-06-13 ENCOUNTER — Other Ambulatory Visit: Payer: Self-pay | Admitting: Internal Medicine

## 2023-06-14 NOTE — Telephone Encounter (Signed)
 Requested Prescriptions  Pending Prescriptions Disp Refills   SYMBICORT 80-4.5 MCG/ACT inhaler [Pharmacy Med Name: SYMBICORT 80-4.5 AEROSOL] 10.2 each 0    Sig: INHALE TWO (2) PUFFS BY MOUTH EVERY DAY     Pulmonology:  Combination Products Passed - 06/14/2023  1:04 PM      Passed - Valid encounter within last 12 months    Recent Outpatient Visits           2 months ago Cystitis   [place] [facility] Royalty, Ocie Bob, MD   3 months ago Acute exacerbation of chronic obstructive pulmonary disease (COPD) (HCC)   [place] [facility] at [facility], Nyoka Cowden, MD   11 months ago Acute maxillary sinusitis, recurrence not specified   [place] [facility] at [facility], Ocie Bob, MD   11 months ago Acute maxillary sinusitis, recurrence not specified   [facility] at [facility], Ocie Bob, MD   1 year ago Flank pain   [facility] Primary [facility] Medicine at [facility], Nyoka Cowden, MD

## 2023-06-20 DIAGNOSIS — R911 Solitary pulmonary nodule: Secondary | ICD-10-CM | POA: Diagnosis not present

## 2023-07-17 ENCOUNTER — Other Ambulatory Visit: Payer: Self-pay | Admitting: Internal Medicine

## 2023-07-17 DIAGNOSIS — I700 Atherosclerosis of aorta: Secondary | ICD-10-CM

## 2023-07-18 NOTE — Telephone Encounter (Signed)
 Requested Prescriptions  Pending Prescriptions Disp Refills   SYMBICORT 80-4.5 MCG/ACT inhaler [Pharmacy Med Name: SYMBICORT 80-4.5 AEROSOL] 10.2 each 0    Sig: INHALE TWO (2) PUFFS BY MOUTH INTO LUNGS EVERY DAY     Pulmonology:  Combination Products Failed - 07/18/2023  1:55 PM      Failed - Valid encounter within last 12 months    Recent Outpatient Visits   None             atorvastatin (LIPITOR) 40 MG tablet [Pharmacy Med Name: ATORVASTATIN CALCIUM 40MG  TABLET] 30 tablet 1    Sig: TAKE (1) TABLET BY MOUTH EVERY DAY     Cardiovascular:  Antilipid - Statins Failed - 07/18/2023  1:55 PM      Failed - Valid encounter within last 12 months    Recent Outpatient Visits   None            Failed - Lipid Panel in normal range within the last 12 months    Cholesterol, Total  Date Value Ref Range Status  01/14/2022 166 100 - 199 mg/dL Final   LDL Chol Calc (NIH)  Date Value Ref Range Status  01/14/2022 48 0 - 99 mg/dL Final   HDL  Date Value Ref Range Status  01/14/2022 109 >39 mg/dL Final   Triglycerides  Date Value Ref Range Status  01/14/2022 35 0 - 149 mg/dL Final         Passed - Patient is not pregnant

## 2023-07-18 NOTE — Telephone Encounter (Signed)
 Requested medication (s) are due for refill today: yes  Requested medication (s) are on the active medication list: yes  Last refill:  05/18/23 #30 1 RF  Future visit scheduled: yes  Notes to clinic:  labwork needed   Requested Prescriptions  Pending Prescriptions Disp Refills   atorvastatin (LIPITOR) 40 MG tablet [Pharmacy Med Name: ATORVASTATIN CALCIUM 40MG  TABLET] 30 tablet     Sig: TAKE (1) TABLET BY MOUTH EVERY DAY     Cardiovascular:  Antilipid - Statins Failed - 07/18/2023  1:58 PM      Failed - Valid encounter within last 12 months    Recent Outpatient Visits   None            Failed - Lipid Panel in normal range within the last 12 months    Cholesterol, Total  Date Value Ref Range Status  01/14/2022 166 100 - 199 mg/dL Final   LDL Chol Calc (NIH)  Date Value Ref Range Status  01/14/2022 48 0 - 99 mg/dL Final   HDL  Date Value Ref Range Status  01/14/2022 109 >39 mg/dL Final   Triglycerides  Date Value Ref Range Status  01/14/2022 35 0 - 149 mg/dL Final         Passed - Patient is not pregnant      Signed Prescriptions Disp Refills   SYMBICORT 80-4.5 MCG/ACT inhaler 10.2 each 0    Sig: INHALE TWO (2) PUFFS BY MOUTH INTO LUNGS EVERY DAY     Pulmonology:  Combination Products Failed - 07/18/2023  1:58 PM      Failed - Valid encounter within last 12 months    Recent Outpatient Visits   None

## 2023-07-22 DIAGNOSIS — Z419 Encounter for procedure for purposes other than remedying health state, unspecified: Secondary | ICD-10-CM | POA: Diagnosis not present

## 2023-08-04 DIAGNOSIS — F17200 Nicotine dependence, unspecified, uncomplicated: Secondary | ICD-10-CM | POA: Diagnosis not present

## 2023-08-04 DIAGNOSIS — J449 Chronic obstructive pulmonary disease, unspecified: Secondary | ICD-10-CM | POA: Diagnosis not present

## 2023-08-04 DIAGNOSIS — C3412 Malignant neoplasm of upper lobe, left bronchus or lung: Secondary | ICD-10-CM | POA: Diagnosis not present

## 2023-08-04 DIAGNOSIS — F1020 Alcohol dependence, uncomplicated: Secondary | ICD-10-CM | POA: Diagnosis not present

## 2023-08-04 DIAGNOSIS — I6522 Occlusion and stenosis of left carotid artery: Secondary | ICD-10-CM | POA: Diagnosis not present

## 2023-08-21 DIAGNOSIS — Z419 Encounter for procedure for purposes other than remedying health state, unspecified: Secondary | ICD-10-CM | POA: Diagnosis not present

## 2023-08-22 DIAGNOSIS — F331 Major depressive disorder, recurrent, moderate: Secondary | ICD-10-CM | POA: Diagnosis not present

## 2023-08-22 DIAGNOSIS — F410 Panic disorder [episodic paroxysmal anxiety] without agoraphobia: Secondary | ICD-10-CM | POA: Diagnosis not present

## 2023-08-22 DIAGNOSIS — F411 Generalized anxiety disorder: Secondary | ICD-10-CM | POA: Diagnosis not present

## 2023-08-30 DIAGNOSIS — Z8673 Personal history of transient ischemic attack (TIA), and cerebral infarction without residual deficits: Secondary | ICD-10-CM | POA: Diagnosis not present

## 2023-08-30 DIAGNOSIS — I4891 Unspecified atrial fibrillation: Secondary | ICD-10-CM | POA: Diagnosis not present

## 2023-08-30 DIAGNOSIS — Z85118 Personal history of other malignant neoplasm of bronchus and lung: Secondary | ICD-10-CM | POA: Diagnosis not present

## 2023-08-30 DIAGNOSIS — R911 Solitary pulmonary nodule: Secondary | ICD-10-CM | POA: Diagnosis not present

## 2023-08-30 DIAGNOSIS — J439 Emphysema, unspecified: Secondary | ICD-10-CM | POA: Diagnosis not present

## 2023-08-30 DIAGNOSIS — J449 Chronic obstructive pulmonary disease, unspecified: Secondary | ICD-10-CM | POA: Diagnosis not present

## 2023-08-30 DIAGNOSIS — Z7982 Long term (current) use of aspirin: Secondary | ICD-10-CM | POA: Diagnosis not present

## 2023-08-30 DIAGNOSIS — R590 Localized enlarged lymph nodes: Secondary | ICD-10-CM | POA: Diagnosis not present

## 2023-08-30 DIAGNOSIS — R846 Abnormal cytological findings in specimens from respiratory organs and thorax: Secondary | ICD-10-CM | POA: Diagnosis not present

## 2023-08-30 DIAGNOSIS — I6522 Occlusion and stenosis of left carotid artery: Secondary | ICD-10-CM | POA: Diagnosis not present

## 2023-08-30 DIAGNOSIS — R9389 Abnormal findings on diagnostic imaging of other specified body structures: Secondary | ICD-10-CM | POA: Diagnosis not present

## 2023-08-30 DIAGNOSIS — J4489 Other specified chronic obstructive pulmonary disease: Secondary | ICD-10-CM | POA: Diagnosis not present

## 2023-08-30 DIAGNOSIS — R0989 Other specified symptoms and signs involving the circulatory and respiratory systems: Secondary | ICD-10-CM | POA: Diagnosis not present

## 2023-08-30 DIAGNOSIS — Z79899 Other long term (current) drug therapy: Secondary | ICD-10-CM | POA: Diagnosis not present

## 2023-08-30 DIAGNOSIS — Z7951 Long term (current) use of inhaled steroids: Secondary | ICD-10-CM | POA: Diagnosis not present

## 2023-08-30 DIAGNOSIS — I340 Nonrheumatic mitral (valve) insufficiency: Secondary | ICD-10-CM | POA: Diagnosis not present

## 2023-08-30 DIAGNOSIS — F17210 Nicotine dependence, cigarettes, uncomplicated: Secondary | ICD-10-CM | POA: Diagnosis not present

## 2023-08-30 DIAGNOSIS — J984 Other disorders of lung: Secondary | ICD-10-CM | POA: Diagnosis not present

## 2023-09-05 ENCOUNTER — Other Ambulatory Visit: Payer: Self-pay | Admitting: Internal Medicine

## 2023-09-08 NOTE — Telephone Encounter (Signed)
 Requested Prescriptions  Pending Prescriptions Disp Refills   albuterol  (VENTOLIN  HFA) 108 (90 Base) MCG/ACT inhaler [Pharmacy Med Name: VENTOLIN  HFA  AEROSOL SOLN] 18 g 0    Sig: INHALE TWO (2) INHALATIONS INTO THE LUNGS EVERY SIX (6) (SIX) HOURS AS NEEDED     Pulmonology:  Beta Agonists 2 Failed - 09/08/2023  9:30 AM      Failed - Valid encounter within last 12 months    Recent Outpatient Visits   None            Passed - Last BP in normal range    BP Readings from Last 1 Encounters:  03/23/23 120/60         Passed - Last Heart Rate in normal range    Pulse Readings from Last 1 Encounters:  03/23/23 (!) 56

## 2023-09-13 ENCOUNTER — Other Ambulatory Visit: Payer: Self-pay | Admitting: Internal Medicine

## 2023-09-13 DIAGNOSIS — I6522 Occlusion and stenosis of left carotid artery: Secondary | ICD-10-CM | POA: Diagnosis not present

## 2023-09-13 DIAGNOSIS — I5020 Unspecified systolic (congestive) heart failure: Secondary | ICD-10-CM | POA: Diagnosis not present

## 2023-09-14 NOTE — Telephone Encounter (Signed)
 Requested Prescriptions  Pending Prescriptions Disp Refills   budesonide -formoterol  (SYMBICORT ) 80-4.5 MCG/ACT inhaler [Pharmacy Med Name: SYMBICORT  80-4.5 AEROSOL] 10.2 each 2    Sig: INHALE TWO (2) PUFFS BY MOUTH INTO LUNGS EVERY DAY     Pulmonology:  Combination Products Failed - 09/14/2023  1:35 PM      Failed - Valid encounter within last 12 months    Recent Outpatient Visits   None

## 2023-09-19 DIAGNOSIS — C3412 Malignant neoplasm of upper lobe, left bronchus or lung: Secondary | ICD-10-CM | POA: Diagnosis not present

## 2023-09-19 DIAGNOSIS — C3432 Malignant neoplasm of lower lobe, left bronchus or lung: Secondary | ICD-10-CM | POA: Diagnosis not present

## 2023-09-21 DIAGNOSIS — Z419 Encounter for procedure for purposes other than remedying health state, unspecified: Secondary | ICD-10-CM | POA: Diagnosis not present

## 2023-09-25 DIAGNOSIS — R911 Solitary pulmonary nodule: Secondary | ICD-10-CM | POA: Diagnosis not present

## 2023-09-25 DIAGNOSIS — C3412 Malignant neoplasm of upper lobe, left bronchus or lung: Secondary | ICD-10-CM | POA: Diagnosis not present

## 2023-09-27 DIAGNOSIS — R911 Solitary pulmonary nodule: Secondary | ICD-10-CM | POA: Diagnosis not present

## 2023-09-27 DIAGNOSIS — C3412 Malignant neoplasm of upper lobe, left bronchus or lung: Secondary | ICD-10-CM | POA: Diagnosis not present

## 2023-09-29 DIAGNOSIS — R911 Solitary pulmonary nodule: Secondary | ICD-10-CM | POA: Diagnosis not present

## 2023-09-29 DIAGNOSIS — C3412 Malignant neoplasm of upper lobe, left bronchus or lung: Secondary | ICD-10-CM | POA: Diagnosis not present

## 2023-10-02 DIAGNOSIS — C3432 Malignant neoplasm of lower lobe, left bronchus or lung: Secondary | ICD-10-CM | POA: Diagnosis not present

## 2023-10-02 DIAGNOSIS — R911 Solitary pulmonary nodule: Secondary | ICD-10-CM | POA: Diagnosis not present

## 2023-10-02 DIAGNOSIS — C3412 Malignant neoplasm of upper lobe, left bronchus or lung: Secondary | ICD-10-CM | POA: Diagnosis not present

## 2023-10-06 DIAGNOSIS — R911 Solitary pulmonary nodule: Secondary | ICD-10-CM | POA: Diagnosis not present

## 2023-10-06 DIAGNOSIS — C3412 Malignant neoplasm of upper lobe, left bronchus or lung: Secondary | ICD-10-CM | POA: Diagnosis not present

## 2023-10-09 DIAGNOSIS — C3412 Malignant neoplasm of upper lobe, left bronchus or lung: Secondary | ICD-10-CM | POA: Diagnosis not present

## 2023-10-09 DIAGNOSIS — R911 Solitary pulmonary nodule: Secondary | ICD-10-CM | POA: Diagnosis not present

## 2023-10-21 DIAGNOSIS — Z419 Encounter for procedure for purposes other than remedying health state, unspecified: Secondary | ICD-10-CM | POA: Diagnosis not present

## 2023-11-14 ENCOUNTER — Other Ambulatory Visit: Payer: Self-pay | Admitting: Internal Medicine

## 2023-11-16 NOTE — Telephone Encounter (Signed)
 Unable to refill per protocol, appointment needed.   Requested Prescriptions  Pending Prescriptions Disp Refills   VENTOLIN  HFA 108 (90 Base) MCG/ACT inhaler [Pharmacy Med Name: VENTOLIN  HFA  AEROSOL SOLN] 18 g 0    Sig: INHALE TWO (2) INHALATIONS INTO THE LUNGS EVERY SIX (6) (SIX) HOURS AS NEEDED     Pulmonology:  Beta Agonists 2 Failed - 11/16/2023  2:10 PM      Failed - Valid encounter within last 12 months    Recent Outpatient Visits   None            Passed - Last BP in normal range    BP Readings from Last 1 Encounters:  03/23/23 120/60         Passed - Last Heart Rate in normal range    Pulse Readings from Last 1 Encounters:  03/23/23 (!) 56

## 2023-11-21 ENCOUNTER — Encounter: Payer: Self-pay | Admitting: Internal Medicine

## 2023-11-21 ENCOUNTER — Ambulatory Visit (INDEPENDENT_AMBULATORY_CARE_PROVIDER_SITE_OTHER): Admitting: Internal Medicine

## 2023-11-21 VITALS — BP 122/66 | HR 46 | Temp 98.3°F | Resp 14 | Ht 65.0 in | Wt 83.0 lb

## 2023-11-21 DIAGNOSIS — C3492 Malignant neoplasm of unspecified part of left bronchus or lung: Secondary | ICD-10-CM

## 2023-11-21 DIAGNOSIS — I10 Essential (primary) hypertension: Secondary | ICD-10-CM

## 2023-11-21 DIAGNOSIS — Z419 Encounter for procedure for purposes other than remedying health state, unspecified: Secondary | ICD-10-CM | POA: Diagnosis not present

## 2023-11-21 DIAGNOSIS — J432 Centrilobular emphysema: Secondary | ICD-10-CM

## 2023-11-21 DIAGNOSIS — H6690 Otitis media, unspecified, unspecified ear: Secondary | ICD-10-CM | POA: Diagnosis not present

## 2023-11-21 MED ORDER — ALBUTEROL SULFATE HFA 108 (90 BASE) MCG/ACT IN AERS: 2.0000 | INHALATION_SPRAY | RESPIRATORY_TRACT | 0 refills | Status: DC | PRN

## 2023-11-21 MED ORDER — AMOXICILLIN 875 MG PO TABS: 875.0000 mg | ORAL_TABLET | Freq: Two times a day (BID) | ORAL | 0 refills | Status: AC

## 2023-11-21 MED ORDER — BUDESONIDE-FORMOTEROL FUMARATE 80-4.5 MCG/ACT IN AERO: 2.0000 | INHALATION_SPRAY | Freq: Every day | RESPIRATORY_TRACT | 0 refills | Status: DC

## 2023-11-21 NOTE — Assessment & Plan Note (Signed)
 Being followed by [facility] for HTN and CHF Now on Entresto, Spironolactone, jardiance and Coreg

## 2023-11-21 NOTE — Progress Notes (Signed)
 Date:  11/21/2023   Name:  Kendra Mullins   DOB:  May 29, 1960   MRN:  [id number]   Chief Complaint: Ear Pain (Left ear pain x 2 days. )  Otalgia  There is pain in the left ear. This is a new problem. The current episode started yesterday. The problem occurs constantly. The problem has been unchanged. There has been no fever. The pain is moderate. Associated symptoms include hearing loss. Pertinent negatives include no ear discharge, headaches or rash. She has tried nothing for the symptoms.  Congestive Heart Failure Presents for follow-up visit. Associated symptoms include fatigue and shortness of breath. Pertinent negatives include no chest pain or palpitations. The symptoms have been stable. Compliance with total regimen is 76-100%. Compliance with medications is 76-100%.  Hypertension This is a chronic problem. The problem is controlled. Associated symptoms include shortness of breath. Pertinent negatives include no chest pain, headaches or palpitations. Past treatments include beta blockers, diuretics and angiotensin blockers. The current treatment provides significant improvement. Hypertensive end-organ damage includes CAD/MI.    Review of Systems  Constitutional:  Positive for fatigue. Negative for chills and fever.  HENT:  Positive for congestion, ear pain and hearing loss. Negative for ear discharge.   Respiratory:  Positive for shortness of breath and wheezing. Negative for chest tightness.   Cardiovascular:  Negative for chest pain and palpitations.  Skin:  Negative for rash.  Neurological:  Negative for dizziness and headaches.  Psychiatric/Behavioral:  Negative for dysphoric mood and sleep disturbance. The patient is not nervous/anxious.      Lab Results  Component Value Date   NA 137 01/14/2022   K 4.1 01/14/2022   CO2 27 01/14/2022   GLUCOSE 91 01/14/2022   BUN 10 01/14/2022   CREATININE 0.62 01/14/2022   CALCIUM  9.5 01/14/2022   EGFR 101 01/14/2022   GFRNONAA  102 10/25/2019   Lab Results  Component Value Date   CHOL 166 01/14/2022   HDL 109 01/14/2022   LDLCALC 48 01/14/2022   TRIG 35 01/14/2022   CHOLHDL 1.5 01/14/2022   Lab Results  Component Value Date   TSH 1.490 01/14/2022   No results found for: HGBA1C Lab Results  Component Value Date   WBC 7.2 01/14/2022   HGB 12.8 01/14/2022   HCT 37.1 01/14/2022   MCV 93 01/14/2022   PLT 335 01/14/2022   Lab Results  Component Value Date   ALT 30 01/14/2022   AST 29 01/14/2022   ALKPHOS 125 (H) 01/14/2022   BILITOT 0.3 01/14/2022   Lab Results  Component Value Date   VD25OH 17.7 (L) 05/04/2021     Patient Active Problem List   Diagnosis Date Noted   Acute heart failure with reduced ejection fraction (HFrEF, <= 40%) (HCC) 03/03/2023   Atrial fibrillation with RVR (HCC) 07/26/2022   Syncope 07/26/2022   Cystitis 02/22/2022   S/P total hysterectomy 05/19/2020   NSCLC of left lung (HCC) 01/29/2020   Closed fracture of head of left humerus 05/15/2019   Aortic atherosclerosis (HCC) 12/31/2017   TIA (transient ischemic attack) 06/25/2017   Localized primary osteoarthritis of hands, bilateral 05/02/2017   Alcohol use disorder, severe, dependence (HCC) 09/15/2014   Anxiety and depression 09/15/2014   Tobacco use disorder 09/15/2014   Essential hypertension 09/15/2014   Hot flash, menopausal 09/15/2014   MI (mitral incompetence) 09/15/2014   Centrilobular emphysema (HCC) 09/15/2014   Low weight 09/15/2014    Allergies  Allergen Reactions   Azithromycin Nausea Only  Past Surgical History:  Procedure Laterality Date   TOTAL ABDOMINAL HYSTERECTOMY  1986   one ovary remains    Social History   Tobacco Use   Smoking status: Every Day    Average packs/day: 0.5 packs/day for 40.0 years (20.0 ttl pk-yrs)    Types: Cigarettes    Start date: 35   Smokeless tobacco: Never  Vaping Use   Vaping status: Every Day  Substance Use Topics   Alcohol use: Yes     Alcohol/week: 50.0 standard drinks of alcohol    Types: 50 Standard drinks or equivalent per week   Drug use: No     Medication list has been reviewed and updated.  Current Meds  Medication Sig   albuterol  (PROVENTIL ) (2.5 MG/3ML) 0.083% nebulizer solution Take 3 mLs (2.5 mg total) by nebulization every 6 (six) hours as needed for wheezing or shortness of breath.   ALPRAZolam (XANAX) 0.5 MG tablet Take 0.5 mg by mouth 2 (two) times daily as needed for anxiety.   amoxicillin  (AMOXIL ) 875 MG tablet Take 1 tablet (875 mg total) by mouth 2 (two) times daily for 10 days.   ASPIRIN  LOW DOSE 81 MG EC tablet TAKE 1 TABLET BY MOUTH ONCE DAILY   atorvastatin  (LIPITOR) 40 MG tablet Take 1 tablet (40 mg total) by mouth daily.   ATROVENT  HFA 17 MCG/ACT inhaler INHALE 2 PUFFS BY MOUTH EVERY 6 HOURS AS NEEDED FOR WHEEZING   carvedilol (COREG) 6.25 MG tablet Take 6.25 mg by mouth 2 (two) times daily with a meal.   ENTRESTO 24-26 MG Take 1 tablet by mouth 2 (two) times daily. (Patient taking differently: Take 1 tablet by mouth daily.)   fluticasone  (FLONASE ) 50 MCG/ACT nasal spray Place 2 sprays into both nostrils daily.   Nebulizers (VIOS AEROSOL DELIVERY SYSTEM) MISC See admin instructions.   ondansetron  (ZOFRAN ) 4 MG tablet Take 1 tablet (4 mg total) by mouth every 8 (eight) hours as needed for nausea or vomiting.   sertraline (ZOLOFT) 50 MG tablet Take 50 mg by mouth daily. [name]   spironolactone (ALDACTONE) 25 MG tablet Take 1 tablet by mouth daily.   thiamine  (VITAMIN B1) 100 MG tablet Take 100 mg by mouth.   [DISCONTINUED] albuterol  (VENTOLIN  HFA) 108 (90 Base) MCG/ACT inhaler INHALE TWO (2) INHALATIONS INTO THE LUNGS EVERY SIX (6) (SIX) HOURS AS NEEDED   [DISCONTINUED] budesonide -formoterol  (SYMBICORT ) 80-4.5 MCG/ACT inhaler INHALE TWO (2) PUFFS BY MOUTH INTO LUNGS EVERY DAY       11/21/2023    1:51 PM 03/23/2023    2:23 PM 03/03/2023    4:13 PM 03/21/2022    4:28 PM  GAD 7 :  Generalized Anxiety Score  Nervous, Anxious, on Edge 2 1 0 1  Control/stop worrying 1 1 1 1   Worry too much - different things 1 1 1 1   Trouble relaxing 1 1 2 1   Restless 1 1 2 1   Easily annoyed or irritable 1 2 2  0  Afraid - awful might happen 0 0 0 0  Total GAD 7 Score 7 7 8 5   Anxiety Difficulty Somewhat difficult Somewhat difficult Extremely difficult Somewhat difficult       11/21/2023    1:51 PM 03/23/2023    2:21 PM 03/03/2023    4:13 PM  Depression screen PHQ 2/9  Decreased Interest 2 2 3   Down, Depressed, Hopeless 1 1 2   PHQ - 2 Score 3 3 5   Altered sleeping 2 1 3   Tired, decreased energy  3 3 3   Change in appetite 0 0 3  Feeling bad or failure about yourself  0 1 1  Trouble concentrating 1 1 2   Moving slowly or fidgety/restless 2 0 2  Suicidal thoughts 0 0 0  PHQ-9 Score 11 9 19   Difficult doing work/chores Somewhat difficult Somewhat difficult Extremely dIfficult    BP Readings from Last 3 Encounters:  11/21/23 122/66  03/23/23 120/60  03/03/23 128/72    Physical Exam Constitutional:      Appearance: She is well-developed.  HENT:     Right Ear: Tympanic membrane, ear canal and external ear normal.     Left Ear: Ear canal and external ear normal. There is impacted cerumen.     Nose:     Right Sinus: No maxillary sinus tenderness or frontal sinus tenderness.     Left Sinus: No maxillary sinus tenderness or frontal sinus tenderness.     Mouth/Throat:     Mouth: No oral lesions.     Pharynx: Uvula midline. No oropharyngeal exudate or posterior oropharyngeal erythema.  Cardiovascular:     Rate and Rhythm: Normal rate and regular rhythm.     Heart sounds: Normal heart sounds.  Pulmonary:     Breath sounds: Decreased breath sounds present. No wheezing or rales.  Lymphadenopathy:     Cervical: No cervical adenopathy.  Neurological:     Mental Status: She is alert and oriented to person, place, and time.     Wt Readings from Last 3 Encounters:  11/21/23  83 lb (37.6 kg)  03/23/23 85 lb (38.6 kg)  03/03/23 86 lb (39 kg)    BP 122/66   Pulse (!) 46   Temp 98.3 F (36.8 C) (Oral)   Resp 14   Ht 5' 5 (1.651 m)   Wt 83 lb (37.6 kg)   SpO2 (!) 85%   BMI 13.81 kg/m   Assessment and Plan:  Problem List Items Addressed This Visit       Unprioritized   Essential hypertension (Chronic)   Being followed by [facility] for HTN and CHF Now on Entresto, Spironolactone, jardiance and Coreg      Relevant Medications   ENTRESTO 24-26 MG   Centrilobular emphysema (HCC) (Chronic)   She continues to smoke but down to 1/2 ppd Will refill Symbicort  and albuterol .      Relevant Medications   albuterol  (VENTOLIN  HFA) 108 (90 Base) MCG/ACT inhaler   budesonide -formoterol  (SYMBICORT ) 80-4.5 MCG/ACT inhaler   NSCLC of left lung (HCC) (Chronic)   Biopsy was benign but she underwent XRT due to extremely suspicious appearance. Will have follow up scans at [facility] in October.      Relevant Medications   amoxicillin  (AMOXIL ) 875 MG tablet   Other Visit Diagnoses       Acute otitis media, unspecified otitis media type    -  Primary   unable to visualize the left TM - would not expect pain with cerumen will treat presumptively; pt to apply 1 drop of H2O2 daily if still symptomatic can refer   Relevant Medications   amoxicillin  (AMOXIL ) 875 MG tablet      She has new PCP at [facility] next month.  No follow-ups on file.    Leita HILARIO Adie, MD [facility] [place]

## 2023-11-21 NOTE — Assessment & Plan Note (Signed)
 Biopsy was benign but she underwent XRT due to extremely suspicious appearance. Will have follow up scans at [facility] in October.

## 2023-11-21 NOTE — Assessment & Plan Note (Signed)
 She continues to smoke but down to 1/2 ppd Will refill Symbicort  and albuterol .

## 2023-12-06 ENCOUNTER — Other Ambulatory Visit: Payer: Self-pay | Admitting: Internal Medicine

## 2023-12-06 ENCOUNTER — Encounter: Payer: Self-pay | Admitting: Internal Medicine

## 2023-12-06 ENCOUNTER — Ambulatory Visit (INDEPENDENT_AMBULATORY_CARE_PROVIDER_SITE_OTHER): Admitting: Internal Medicine

## 2023-12-06 ENCOUNTER — Ambulatory Visit: Payer: Self-pay

## 2023-12-06 VITALS — BP 120/72 | HR 68 | Ht 65.0 in | Wt 83.0 lb

## 2023-12-06 DIAGNOSIS — H6123 Impacted cerumen, bilateral: Secondary | ICD-10-CM

## 2023-12-06 DIAGNOSIS — J432 Centrilobular emphysema: Secondary | ICD-10-CM

## 2023-12-06 DIAGNOSIS — N3000 Acute cystitis without hematuria: Secondary | ICD-10-CM | POA: Diagnosis not present

## 2023-12-06 LAB — POCT URINALYSIS DIPSTICK
Bilirubin, UA: NEGATIVE
Glucose, UA: POSITIVE — AB
Ketones, UA: NEGATIVE
Nitrite, UA: POSITIVE — AB
Protein, UA: POSITIVE — AB
Spec Grav, UA: 1.02 (ref 1.010–1.025)
Urobilinogen, UA: 0.2 U/dL
pH, UA: 5.5 (ref 5.0–8.0)

## 2023-12-06 MED ORDER — NITROFURANTOIN MONOHYD MACRO 100 MG PO CAPS: 100.0000 mg | ORAL_CAPSULE | Freq: Two times a day (BID) | ORAL | 0 refills | Status: AC

## 2023-12-06 NOTE — Telephone Encounter (Signed)
 Noted  Pt has a appt.  KP

## 2023-12-06 NOTE — Addendum Note (Signed)
 Addended by: CLAUDIS EUAL SAILOR on: 12/06/2023 02:19 PM   Modules accepted: Orders

## 2023-12-06 NOTE — Progress Notes (Signed)
 Date:  12/06/2023   Name:  Kendra Mullins   DOB:  Dec 11, 1960   MRN:  [id number]   Chief Complaint: Urinary Tract Infection (Dysuria, frequency, incontinence, lower back pain, and lower pelvic pressure.)  Urinary Tract Infection  This is a new problem. The current episode started yesterday. The problem occurs every urination. The problem has been unchanged. The quality of the pain is described as burning. The pain is mild. There has been no fever. Associated symptoms include flank pain, frequency and urgency. Pertinent negatives include no hematuria or nausea. She has tried increased fluids for the symptoms. The treatment provided no relief.  Otalgia  There is pain in the left ear. This is a chronic problem. The problem has been gradually improving. There has been no fever. Associated symptoms include hearing loss. She has tried antibiotics for the symptoms.    Review of Systems  Constitutional:  Negative for fatigue and fever.  HENT:  Positive for ear pain and hearing loss.   Respiratory:  Negative for chest tightness and shortness of breath.   Cardiovascular:  Negative for chest pain and palpitations.  Gastrointestinal:  Negative for nausea.  Genitourinary:  Positive for flank pain, frequency and urgency. Negative for hematuria.     Lab Results  Component Value Date   NA 137 01/14/2022   K 4.1 01/14/2022   CO2 27 01/14/2022   GLUCOSE 91 01/14/2022   BUN 10 01/14/2022   CREATININE 0.62 01/14/2022   CALCIUM  9.5 01/14/2022   EGFR 101 01/14/2022   GFRNONAA 102 10/25/2019   Lab Results  Component Value Date   CHOL 166 01/14/2022   HDL 109 01/14/2022   LDLCALC 48 01/14/2022   TRIG 35 01/14/2022   CHOLHDL 1.5 01/14/2022   Lab Results  Component Value Date   TSH 1.490 01/14/2022   No results found for: HGBA1C Lab Results  Component Value Date   WBC 7.2 01/14/2022   HGB 12.8 01/14/2022   HCT 37.1 01/14/2022   MCV 93 01/14/2022   PLT 335 01/14/2022   Lab Results   Component Value Date   ALT 30 01/14/2022   AST 29 01/14/2022   ALKPHOS 125 (H) 01/14/2022   BILITOT 0.3 01/14/2022   Lab Results  Component Value Date   VD25OH 17.7 (L) 05/04/2021     Patient Active Problem List   Diagnosis Date Noted   Acute heart failure with reduced ejection fraction (HFrEF, <= 40%) (HCC) 03/03/2023   Atrial fibrillation with RVR (HCC) 07/26/2022   Syncope 07/26/2022   Cystitis 02/22/2022   S/P total hysterectomy 05/19/2020   NSCLC of left lung (HCC) 01/29/2020   Closed fracture of head of left humerus 05/15/2019   Aortic atherosclerosis (HCC) 12/31/2017   TIA (transient ischemic attack) 06/25/2017   Localized primary osteoarthritis of hands, bilateral 05/02/2017   Alcohol use disorder, severe, dependence (HCC) 09/15/2014   Anxiety and depression 09/15/2014   Tobacco use disorder 09/15/2014   Essential hypertension 09/15/2014   Hot flash, menopausal 09/15/2014   MI (mitral incompetence) 09/15/2014   Centrilobular emphysema (HCC) 09/15/2014   Low weight 09/15/2014    Allergies  Allergen Reactions   Azithromycin Nausea Only    Past Surgical History:  Procedure Laterality Date   TOTAL ABDOMINAL HYSTERECTOMY  1986   one ovary remains    Social History   Tobacco Use   Smoking status: Every Day    Average packs/day: 0.5 packs/day for 40.0 years (20.0 ttl pk-yrs)    Types:  Cigarettes    Start date: 1975   Smokeless tobacco: Never  Vaping Use   Vaping status: Every Day  Substance Use Topics   Alcohol use: Yes    Alcohol/week: 50.0 standard drinks of alcohol    Types: 50 Standard drinks or equivalent per week   Drug use: No     Medication list has been reviewed and updated.  Current Meds  Medication Sig   albuterol  (PROVENTIL ) (2.5 MG/3ML) 0.083% nebulizer solution Take 3 mLs (2.5 mg total) by nebulization every 6 (six) hours as needed for wheezing or shortness of breath.   albuterol  (VENTOLIN  HFA) 108 (90 Base) MCG/ACT inhaler Inhale 2  puffs into the lungs every 4 (four) hours as needed for wheezing or shortness of breath.   ALPRAZolam (XANAX) 0.5 MG tablet Take 0.5 mg by mouth 2 (two) times daily as needed for anxiety.   ASPIRIN  LOW DOSE 81 MG EC tablet TAKE 1 TABLET BY MOUTH ONCE DAILY   atorvastatin  (LIPITOR) 40 MG tablet Take 1 tablet (40 mg total) by mouth daily.   ATROVENT  HFA 17 MCG/ACT inhaler INHALE 2 PUFFS BY MOUTH EVERY 6 HOURS AS NEEDED FOR WHEEZING   budesonide -formoterol  (SYMBICORT ) 80-4.5 MCG/ACT inhaler Inhale 2 puffs into the lungs daily at 6 (six) AM.   carvedilol (COREG) 6.25 MG tablet Take 6.25 mg by mouth 2 (two) times daily with a meal.   ENTRESTO 24-26 MG Take 1 tablet by mouth 2 (two) times daily. (Patient taking differently: Take 1 tablet by mouth daily.)   fluticasone  (FLONASE ) 50 MCG/ACT nasal spray Place 2 sprays into both nostrils daily.   JARDIANCE 10 MG TABS tablet Take 10 mg by mouth daily.   Nebulizers (VIOS AEROSOL DELIVERY SYSTEM) MISC See admin instructions.   nicotine  (NICODERM CQ  - DOSED IN MG/24 HOURS) 21 mg/24hr patch Place onto the skin.   nitrofurantoin , macrocrystal-monohydrate, (MACROBID ) 100 MG capsule Take 1 capsule (100 mg total) by mouth 2 (two) times daily for 7 days.   ondansetron  (ZOFRAN ) 4 MG tablet Take 1 tablet (4 mg total) by mouth every 8 (eight) hours as needed for nausea or vomiting.   sertraline (ZOLOFT) 50 MG tablet Take 50 mg by mouth daily. [name]   spironolactone (ALDACTONE) 25 MG tablet Take 1 tablet by mouth daily.   thiamine  (VITAMIN B1) 100 MG tablet Take 100 mg by mouth.       12/06/2023    1:47 PM 11/21/2023    1:51 PM 03/23/2023    2:23 PM 03/03/2023    4:13 PM  GAD 7 : Generalized Anxiety Score  Nervous, Anxious, on Edge 1 2 1  0  Control/stop worrying 1 1 1 1   Worry too much - different things 1 1 1 1   Trouble relaxing 1 1 1 2   Restless 1 1 1 2   Easily annoyed or irritable 1 1 2 2   Afraid - awful might happen 1 0 0 0  Total GAD 7 Score 7 7  7 8   Anxiety Difficulty Somewhat difficult Somewhat difficult Somewhat difficult Extremely difficult       12/06/2023    1:47 PM 11/21/2023    1:51 PM 03/23/2023    2:21 PM  Depression screen PHQ 2/9  Decreased Interest 1 2 2   Down, Depressed, Hopeless 1 1 1   PHQ - 2 Score 2 3 3   Altered sleeping 1 2 1   Tired, decreased energy 2 3 3   Change in appetite 0 0 0  Feeling bad or failure about yourself  0 0 1  Trouble concentrating 1 1 1   Moving slowly or fidgety/restless 1 2 0  Suicidal thoughts 0 0 0  PHQ-9 Score 7 11 9   Difficult doing work/chores Not difficult at all Somewhat difficult Somewhat difficult    BP Readings from Last 3 Encounters:  12/06/23 120/72  11/21/23 122/66  03/23/23 120/60    Physical Exam Vitals and nursing note reviewed.  Constitutional:      Appearance: Normal appearance. She is well-developed.  HENT:     Right Ear: There is impacted cerumen.     Left Ear: There is impacted cerumen.  Cardiovascular:     Rate and Rhythm: Normal rate and regular rhythm.     Heart sounds: Normal heart sounds.  Pulmonary:     Effort: Pulmonary effort is normal. No respiratory distress.     Breath sounds: Normal breath sounds.  Abdominal:     General: Bowel sounds are normal.     Palpations: Abdomen is soft.     Tenderness: There is abdominal tenderness in the suprapubic area. There is no right CVA tenderness, left CVA tenderness, guarding or rebound.  Neurological:     Mental Status: She is alert.    Lab Results  Component Value Date   COLORU highlighter yellow 12/06/2023   CLARITYU cloudy 12/06/2023   GLUCOSEUR Positive (A) 12/06/2023   BILIRUBINUR neg 12/06/2023   KETONESU neg 12/06/2023   SPECGRAV 1.020 12/06/2023   RBCUR large 3+ (A) 12/06/2023   PHUR 5.5 12/06/2023   PROTEINUR Positive (A) 12/06/2023   UROBILINOGEN 0.2 12/06/2023   LEUKOCYTESUR Large (3+) (A) 12/06/2023     Wt Readings from Last 3 Encounters:  11/21/23 83 lb (37.6 kg)  03/23/23  85 lb (38.6 kg)  03/03/23 86 lb (39 kg)    BP 120/72   Pulse 68   Ht 5' 5 (1.651 m)   SpO2 96%   BMI 13.81 kg/m   Assessment and Plan:  Problem List Items Addressed This Visit   None Visit Diagnoses       Acute cystitis without hematuria    -  Primary   continue to push fluids will culture and advise if change needed   Relevant Medications   nitrofurantoin , macrocrystal-monohydrate, (MACROBID ) 100 MG capsule   Other Relevant Orders   POCT urinalysis dipstick (Completed)     Excessive cerumen in ear canal, bilateral       no evidence of infection at this time needs ears cleaned   Relevant Orders   Ambulatory referral to ENT       No follow-ups on file.    Leita HILARIO Adie, MD [facility] [place]

## 2023-12-06 NOTE — Telephone Encounter (Signed)
 FYI Only or Action Required?: Action required by provider: request for appointment.  Patient was last seen in primary care on 11/21/2023 by Justus Leita DEL, MD.  Called Nurse Triage reporting urinary symptoms.  Symptoms began a week ago.  Interventions attempted: OTC medications: AZO.  Symptoms are: gradually worsening.Symptoms worsening.  Triage Disposition: See Physician Within 24 Hours  Patient/caregiver understands and will follow disposition?: Yes  Copied from CRM #[id number]. Topic: Clinical - Red Word Triage >> Dec 06, 2023  8:33 AM Antwanette L wrote: Red Word that prompted transfer to Nurse Triage: Patient tested positive for a UTI. The patient is experiencing is frequent/during urination, fatigue,and chills Reason for Disposition  Urinating more frequently than usual (i.e., frequency) OR new-onset of the feeling of an urgent need to urinate (i.e., urgency)  Answer Assessment - Initial Assessment Questions 1. SYMPTOM: What's the main symptom you're concerned about? (e.g., frequency, incontinence)     Frequency, low grade grade fever, chills 2. ONSET: When did the    start?     1 week 3. PAIN: Is there any pain? If Yes, ask: How bad is it? (Scale: 1-10; mild, moderate, severe)     yes 4. CAUSE: What do you think is causing the symptoms?     UTI 5. OTHER SYMPTOMS: Do you have any other symptoms? (e.g., blood in urine, fever, flank pain, pain with urination)     NO 6. PREGNANCY: Is there any chance you are pregnant? When was your last menstrual period?     NO  Protocols used: Urinary Symptoms-A-AH

## 2023-12-07 NOTE — Telephone Encounter (Signed)
 Requested Prescriptions  Pending Prescriptions Disp Refills   VENTOLIN  HFA 108 (90 Base) MCG/ACT inhaler [Pharmacy Med Name: VENTOLIN  HFA  AEROSOL SOLN] 18 g 0    Sig: INHALE TWO (2) INHALATIONS INTO THE LUNGS EVERY SIX (6) (SIX) HOURS AS NEEDED     Pulmonology:  Beta Agonists 2 Passed - 12/07/2023  3:42 PM      Passed - Last BP in normal range    BP Readings from Last 1 Encounters:  12/06/23 120/72         Passed - Last Heart Rate in normal range    Pulse Readings from Last 1 Encounters:  12/06/23 68         Passed - Valid encounter within last 12 months    Recent Outpatient Visits           Yesterday Acute cystitis without hematuria   [facility], Leita DEL, MD   2 weeks ago Acute otitis media, unspecified otitis media type   [facility] at [facility], Leita DEL, MD

## 2023-12-10 LAB — URINE CULTURE

## 2023-12-11 ENCOUNTER — Ambulatory Visit: Payer: Self-pay | Admitting: Internal Medicine

## 2023-12-14 ENCOUNTER — Ambulatory Visit: Payer: Self-pay

## 2023-12-14 NOTE — Telephone Encounter (Signed)
 Please review.  KP

## 2023-12-14 NOTE — Telephone Encounter (Signed)
 FYI Only or Action Required?: Action required by provider: update on patient condition and request for additional abx.  Patient was last seen in primary care on 12/06/2023 by Justus Leita DEL, MD.  Called Nurse Triage reporting Urinary Tract Infection.  Symptoms began several weeks ago.  Interventions attempted: Prescription medications: abx per pt.  Symptoms are: unchanged.  Triage Disposition: See HCP Within 4 Hours (Or PCP Triage)  Patient/caregiver understands and will follow disposition?: No, wishes to speak with PCP      Copied from CRM #[id number]. Topic: Clinical - Red Word Triage >> Dec 14, 2023  1:47 PM Amy B wrote: Red Word that prompted transfer to Nurse Triage: severe pain, worsening, UTI Reason for Disposition  [1] SEVERE pain with urination (e.g., excruciating) AND [2] not improved after 2 hours of pain medicine  Answer Assessment - Initial Assessment Questions 1. SEVERITY: How bad is the pain?  (e.g., Scale 1-10; mild, moderate, or severe)     Really bad 8/10 - abd and down under Recently finished abx on Tuesday - sx initially improved but now returned 2. FREQUENCY: How many times have you had painful urination today?      All day - I have no idea 3. PATTERN: Is pain present every time you urinate or just sometimes?      constant 4. ONSET: When did the painful urination start?      X 2 weeks 5. FEVER: Do you have a fever? If Yes, ask: What is your temperature, how was it measured, and when did it start?     denies 6. PAST UTI: Have you had a urine infection before? If Yes, ask: When was the last time? and What happened that time?      Yes, recently finished abx for UTI 7. CAUSE: What do you think is causing the painful urination?  (e.g., UTI, scratch, Herpes sore)     UTI - feels like abx did not fully clear UTI 8. OTHER SYMPTOMS: Do you have any other symptoms? (e.g., blood in urine, flank pain, genital sores, urgency, vaginal  discharge)     Urinary frequency, burning 9. PREGNANCY: Is there any chance you are pregnant? When was your last menstrual period?     N/a    Pt requesting PCP call in additional abx for UTI. Pt recently finished abx this past Tuesday and sx are still present. Pt does endorse transportation issues and is unable to physically come to office.  Triager will forward encounter for Dr Franz 's office to review and advise. Patient verbalized understanding and is expecting call back from office for next steps. Triager also advised that if pt does not hear back from office, to follow disposition for further evaluation/treatment.  Protocols used: Urination Pain - Female-A-AH

## 2023-12-15 ENCOUNTER — Ambulatory Visit: Payer: Self-pay

## 2023-12-15 ENCOUNTER — Ambulatory Visit: Admitting: Internal Medicine

## 2023-12-15 ENCOUNTER — Other Ambulatory Visit: Payer: Self-pay | Admitting: Internal Medicine

## 2023-12-15 DIAGNOSIS — N3000 Acute cystitis without hematuria: Secondary | ICD-10-CM

## 2023-12-15 MED ORDER — SULFAMETHOXAZOLE-TRIMETHOPRIM 800-160 MG PO TABS: 1.0000 | ORAL_TABLET | Freq: Two times a day (BID) | ORAL | 0 refills | Status: AC

## 2023-12-15 NOTE — Telephone Encounter (Signed)
Called pt could not leave VM to call back.  KP

## 2023-12-15 NOTE — Telephone Encounter (Signed)
 Cancelled pt appt please send in antibiotic.  KP

## 2023-12-15 NOTE — Telephone Encounter (Signed)
 Noted  Pt has a appt.  KP

## 2023-12-15 NOTE — Progress Notes (Unsigned)
 Date:  12/15/2023   Name:  Kendra Mullins   DOB:  June 20, 1960   MRN:  [id number]   Chief Complaint: No chief complaint on file.  HPI  Review of Systems   Lab Results  Component Value Date   NA 137 01/14/2022   K 4.1 01/14/2022   CO2 27 01/14/2022   GLUCOSE 91 01/14/2022   BUN 10 01/14/2022   CREATININE 0.62 01/14/2022   CALCIUM  9.5 01/14/2022   EGFR 101 01/14/2022   GFRNONAA 102 10/25/2019   Lab Results  Component Value Date   CHOL 166 01/14/2022   HDL 109 01/14/2022   LDLCALC 48 01/14/2022   TRIG 35 01/14/2022   CHOLHDL 1.5 01/14/2022   Lab Results  Component Value Date   TSH 1.490 01/14/2022   No results found for: HGBA1C Lab Results  Component Value Date   WBC 7.2 01/14/2022   HGB 12.8 01/14/2022   HCT 37.1 01/14/2022   MCV 93 01/14/2022   PLT 335 01/14/2022   Lab Results  Component Value Date   ALT 30 01/14/2022   AST 29 01/14/2022   ALKPHOS 125 (H) 01/14/2022   BILITOT 0.3 01/14/2022   Lab Results  Component Value Date   VD25OH 17.7 (L) 05/04/2021     Patient Active Problem List   Diagnosis Date Noted   Acute heart failure with reduced ejection fraction (HFrEF, <= 40%) (HCC) 03/03/2023   Atrial fibrillation with RVR (HCC) 07/26/2022   Syncope 07/26/2022   Cystitis 02/22/2022   S/P total hysterectomy 05/19/2020   NSCLC of left lung (HCC) 01/29/2020   Closed fracture of head of left humerus 05/15/2019   Aortic atherosclerosis (HCC) 12/31/2017   TIA (transient ischemic attack) 06/25/2017   Localized primary osteoarthritis of hands, bilateral 05/02/2017   Alcohol use disorder, severe, dependence (HCC) 09/15/2014   Anxiety and depression 09/15/2014   Tobacco use disorder 09/15/2014   Essential hypertension 09/15/2014   Hot flash, menopausal 09/15/2014   MI (mitral incompetence) 09/15/2014   Centrilobular emphysema (HCC) 09/15/2014   Low weight 09/15/2014    Allergies  Allergen Reactions   Azithromycin Nausea Only    Past  Surgical History:  Procedure Laterality Date   TOTAL ABDOMINAL HYSTERECTOMY  1986   one ovary remains    Social History   Tobacco Use   Smoking status: Every Day    Average packs/day: 0.5 packs/day for 40.0 years (20.0 ttl pk-yrs)    Types: Cigarettes    Start date: 1975   Smokeless tobacco: Never  Vaping Use   Vaping status: Every Day  Substance Use Topics   Alcohol use: Yes    Alcohol/week: 50.0 standard drinks of alcohol    Types: 50 Standard drinks or equivalent per week   Drug use: No     Medication list has been reviewed and updated.  No outpatient medications have been marked as taking for the 12/15/23 encounter (Orders Only) with Justus Leita DEL, MD.       12/06/2023    1:47 PM 11/21/2023    1:51 PM 03/23/2023    2:23 PM 03/03/2023    4:13 PM  GAD 7 : Generalized Anxiety Score  Nervous, Anxious, on Edge 1 2 1  0  Control/stop worrying 1 1 1 1   Worry too much - different things 1 1 1 1   Trouble relaxing 1 1 1 2   Restless 1 1 1 2   Easily annoyed or irritable 1 1 2 2   Afraid - awful might happen  1 0 0 0  Total GAD 7 Score 7 7 7 8   Anxiety Difficulty Somewhat difficult Somewhat difficult Somewhat difficult Extremely difficult       12/06/2023    1:47 PM 11/21/2023    1:51 PM 03/23/2023    2:21 PM  Depression screen PHQ 2/9  Decreased Interest 1 2 2   Down, Depressed, Hopeless 1 1 1   PHQ - 2 Score 2 3 3   Altered sleeping 1 2 1   Tired, decreased energy 2 3 3   Change in appetite 0 0 0  Feeling bad or failure about yourself  0 0 1  Trouble concentrating 1 1 1   Moving slowly or fidgety/restless 1 2 0  Suicidal thoughts 0 0 0  PHQ-9 Score 7 11 9   Difficult doing work/chores Not difficult at all Somewhat difficult Somewhat difficult    BP Readings from Last 3 Encounters:  12/06/23 120/72  11/21/23 122/66  03/23/23 120/60    Physical Exam  Wt Readings from Last 3 Encounters:  12/06/23 83 lb (37.6 kg)  11/21/23 83 lb (37.6 kg)  03/23/23 85 lb (38.6  kg)    There were no vitals taken for this visit.  Assessment and Plan:  Problem List Items Addressed This Visit   None   No follow-ups on file.    Leita HILARIO Adie, MD [facility] [place]

## 2023-12-15 NOTE — Telephone Encounter (Signed)
 FYI Only or Action Required?: FYI only for provider.  Patient was last seen in primary care on 12/06/2023 by Justus Leita DEL, MD.  Called Nurse Triage reporting Urinary Frequency and Dysuria.  Symptoms began several days ago.  Interventions attempted: Prescription medications: finished antibiotics on Tuesday and Rest, hydration, or home remedies.  Symptoms are: unchanged.  Triage Disposition: See Physician Within 24 Hours  Patient/caregiver understands and will follow disposition?: Yes  Copied from CRM [id number]. Topic: Clinical - Red Word Triage >> Dec 15, 2023 10:42 AM Debby BROCKS wrote: Red Word that prompted transfer to Nurse Triage: UTI/ bladder infection antibiotics are done and it didnt clear it up and its gotten worse  Would like to see someone Reason for Disposition  Urinating more frequently than usual (i.e., frequency) OR new-onset of the feeling of an urgent need to urinate (i.e., urgency)  Answer Assessment - Initial Assessment Questions 1. SYMPTOM: What's the main symptom you're concerned about? (e.g., frequency, incontinence)     Urinary frequency 2. ONSET: When did the  urinary frequency  start?     12/06/2023 3. PAIN: Is there any pain? If Yes, ask: How bad is it? (Scale: 1-10; mild, moderate, severe)     6 out of 10 4. CAUSE: What do you think is causing the symptoms?     Was dx with UTI on 8/27 but patient feels like the symptoms didn't go away with the antibiotics.  5. OTHER SYMPTOMS: Do you have any other symptoms? (e.g., blood in urine, fever, flank pain, pain with urination)     Pain with urination, flank pain  Patient finished antibiotics on Tuesday. Patient reports symptoms have gotten worse and patient is wanting to be seen in the office. Scheduled for an acute appointment today at 2:40 PM  Protocols used: Urinary Symptoms-A-AH

## 2023-12-20 DIAGNOSIS — R42 Dizziness and giddiness: Secondary | ICD-10-CM | POA: Diagnosis not present

## 2023-12-20 DIAGNOSIS — Z743 Need for continuous supervision: Secondary | ICD-10-CM | POA: Diagnosis not present

## 2023-12-21 DIAGNOSIS — R079 Chest pain, unspecified: Secondary | ICD-10-CM | POA: Diagnosis not present

## 2023-12-21 DIAGNOSIS — R42 Dizziness and giddiness: Secondary | ICD-10-CM | POA: Diagnosis not present

## 2023-12-21 DIAGNOSIS — R110 Nausea: Secondary | ICD-10-CM | POA: Diagnosis not present

## 2023-12-21 DIAGNOSIS — R531 Weakness: Secondary | ICD-10-CM | POA: Diagnosis not present

## 2023-12-21 DIAGNOSIS — F17210 Nicotine dependence, cigarettes, uncomplicated: Secondary | ICD-10-CM | POA: Diagnosis not present

## 2023-12-21 DIAGNOSIS — J449 Chronic obstructive pulmonary disease, unspecified: Secondary | ICD-10-CM | POA: Diagnosis not present

## 2023-12-21 DIAGNOSIS — J984 Other disorders of lung: Secondary | ICD-10-CM | POA: Diagnosis not present

## 2023-12-21 DIAGNOSIS — R0789 Other chest pain: Secondary | ICD-10-CM | POA: Diagnosis not present

## 2023-12-22 DIAGNOSIS — Z419 Encounter for procedure for purposes other than remedying health state, unspecified: Secondary | ICD-10-CM | POA: Diagnosis not present

## 2023-12-27 ENCOUNTER — Ambulatory Visit: Payer: Self-pay

## 2023-12-27 ENCOUNTER — Ambulatory Visit (INDEPENDENT_AMBULATORY_CARE_PROVIDER_SITE_OTHER): Admitting: Internal Medicine

## 2023-12-27 ENCOUNTER — Encounter: Payer: Self-pay | Admitting: Internal Medicine

## 2023-12-27 VITALS — BP 128/60 | HR 103 | Ht 65.0 in | Wt 84.8 lb

## 2023-12-27 DIAGNOSIS — R300 Dysuria: Secondary | ICD-10-CM | POA: Diagnosis not present

## 2023-12-27 LAB — POCT URINALYSIS DIPSTICK
Bilirubin, UA: NEGATIVE
Blood, UA: NEGATIVE
Glucose, UA: POSITIVE — AB
Ketones, UA: NEGATIVE
Nitrite, UA: NEGATIVE
Protein, UA: NEGATIVE
Spec Grav, UA: 1.01 (ref 1.010–1.025)
Urobilinogen, UA: 0.2 U/dL
pH, UA: 6 (ref 5.0–8.0)

## 2023-12-27 MED ORDER — FLUCONAZOLE 100 MG PO TABS: 100.0000 mg | ORAL_TABLET | Freq: Every day | ORAL | 0 refills | Status: AC

## 2023-12-27 MED ORDER — CIPROFLOXACIN HCL 250 MG PO TABS: 250.0000 mg | ORAL_TABLET | Freq: Two times a day (BID) | ORAL | 0 refills | Status: AC

## 2023-12-27 NOTE — Progress Notes (Signed)
 Date:  12/27/2023   Name:  Kendra Mullins   DOB:  05-Jun-1960   MRN:  [id number]   Chief Complaint: Urinary Tract Infection (Dysuria x 3 weeks. Burning and frequency. Patient has discomfort in low abdomen and back. )  Urinary Tract Infection  This is a recurrent problem. Associated symptoms include frequency. Pertinent negatives include no chills.  8/27 - Culture + E coli ; treated with Macrobid  the Bactrim . 9/11 - UA negative Her symptoms never completely resolved but got much worse yesterday.  Drinking lots of tea, cranberry juice and water.  No vaginal itching or irritation.  She does take Jardiance which causes + glucose in urine.  Review of Systems  Constitutional:  Positive for fatigue. Negative for chills and fever.  Respiratory:  Positive for shortness of breath. Negative for chest tightness.   Cardiovascular:  Negative for chest pain and leg swelling.  Genitourinary:  Positive for dysuria and frequency. Negative for vaginal discharge.  Musculoskeletal:  Positive for back pain.  Psychiatric/Behavioral:  Negative for dysphoric mood and sleep disturbance. The patient is not nervous/anxious.      Lab Results  Component Value Date   NA 137 01/14/2022   K 4.1 01/14/2022   CO2 27 01/14/2022   GLUCOSE 91 01/14/2022   BUN 10 01/14/2022   CREATININE 0.62 01/14/2022   CALCIUM  9.5 01/14/2022   EGFR 101 01/14/2022   GFRNONAA 102 10/25/2019   Lab Results  Component Value Date   CHOL 166 01/14/2022   HDL 109 01/14/2022   LDLCALC 48 01/14/2022   TRIG 35 01/14/2022   CHOLHDL 1.5 01/14/2022   Lab Results  Component Value Date   TSH 1.490 01/14/2022   No results found for: HGBA1C Lab Results  Component Value Date   WBC 7.2 01/14/2022   HGB 12.8 01/14/2022   HCT 37.1 01/14/2022   MCV 93 01/14/2022   PLT 335 01/14/2022   Lab Results  Component Value Date   ALT 30 01/14/2022   AST 29 01/14/2022   ALKPHOS 125 (H) 01/14/2022   BILITOT 0.3 01/14/2022   Lab Results   Component Value Date   VD25OH 17.7 (L) 05/04/2021     Patient Active Problem List   Diagnosis Date Noted   Acute heart failure with reduced ejection fraction (HFrEF, <= 40%) (HCC) 03/03/2023   Atrial fibrillation with RVR (HCC) 07/26/2022   Syncope 07/26/2022   Cystitis 02/22/2022   S/P total hysterectomy 05/19/2020   NSCLC of left lung (HCC) 01/29/2020   Closed fracture of head of left humerus 05/15/2019   Aortic atherosclerosis (HCC) 12/31/2017   TIA (transient ischemic attack) 06/25/2017   Localized primary osteoarthritis of hands, bilateral 05/02/2017   Alcohol use disorder, severe, dependence (HCC) 09/15/2014   Anxiety and depression 09/15/2014   Tobacco use disorder 09/15/2014   Essential hypertension 09/15/2014   Hot flash, menopausal 09/15/2014   MI (mitral incompetence) 09/15/2014   Centrilobular emphysema (HCC) 09/15/2014   Low weight 09/15/2014    Allergies  Allergen Reactions   Azithromycin Nausea Only    Past Surgical History:  Procedure Laterality Date   TOTAL ABDOMINAL HYSTERECTOMY  1986   one ovary remains    Social History   Tobacco Use   Smoking status: Every Day    Average packs/day: 0.5 packs/day for 40.0 years (20.0 ttl pk-yrs)    Types: Cigarettes    Start date: 1975   Smokeless tobacco: Never  Vaping Use   Vaping status: Every Day  Substance  Use Topics   Alcohol use: Yes    Alcohol/week: 50.0 standard drinks of alcohol    Types: 50 Standard drinks or equivalent per week   Drug use: No     Medication list has been reviewed and updated.  Current Meds  Medication Sig   albuterol  (PROVENTIL ) (2.5 MG/3ML) 0.083% nebulizer solution Take 3 mLs (2.5 mg total) by nebulization every 6 (six) hours as needed for wheezing or shortness of breath.   ALPRAZolam (XANAX) 0.5 MG tablet Take 0.5 mg by mouth 2 (two) times daily as needed for anxiety.   ASPIRIN  LOW DOSE 81 MG EC tablet TAKE 1 TABLET BY MOUTH ONCE DAILY   atorvastatin  (LIPITOR) 40 MG  tablet Take 1 tablet (40 mg total) by mouth daily.   ATROVENT  HFA 17 MCG/ACT inhaler INHALE 2 PUFFS BY MOUTH EVERY 6 HOURS AS NEEDED FOR WHEEZING   budesonide -formoterol  (SYMBICORT ) 80-4.5 MCG/ACT inhaler Inhale 2 puffs into the lungs daily at 6 (six) AM.   carvedilol (COREG) 6.25 MG tablet Take 6.25 mg by mouth 2 (two) times daily with a meal.   ciprofloxacin  (CIPRO ) 250 MG tablet Take 1 tablet (250 mg total) by mouth 2 (two) times daily for 3 days.   ENTRESTO 24-26 MG Take 1 tablet by mouth 2 (two) times daily. (Patient taking differently: Take 1 tablet by mouth daily.)   fluconazole  (DIFLUCAN ) 100 MG tablet Take 1 tablet (100 mg total) by mouth daily for 3 days.   fluticasone  (FLONASE ) 50 MCG/ACT nasal spray Place 2 sprays into both nostrils daily.   JARDIANCE 10 MG TABS tablet Take 10 mg by mouth daily.   Nebulizers (VIOS AEROSOL DELIVERY SYSTEM) MISC See admin instructions.   nicotine  (NICODERM CQ  - DOSED IN MG/24 HOURS) 21 mg/24hr patch Place onto the skin.   ondansetron  (ZOFRAN ) 4 MG tablet Take 1 tablet (4 mg total) by mouth every 8 (eight) hours as needed for nausea or vomiting.   sertraline (ZOLOFT) 50 MG tablet Take 50 mg by mouth daily. [name]   spironolactone (ALDACTONE) 25 MG tablet Take 1 tablet by mouth daily.   thiamine  (VITAMIN B1) 100 MG tablet Take 100 mg by mouth.   VENTOLIN  HFA 108 (90 Base) MCG/ACT inhaler INHALE TWO (2) INHALATIONS INTO THE LUNGS EVERY SIX (6) (SIX) HOURS AS NEEDED       12/27/2023    2:00 PM 12/06/2023    1:47 PM 11/21/2023    1:51 PM 03/23/2023    2:23 PM  GAD 7 : Generalized Anxiety Score  Nervous, Anxious, on Edge 1 1 2 1   Control/stop worrying 0 1 1 1   Worry too much - different things 0 1 1 1   Trouble relaxing 0 1 1 1   Restless 0 1 1 1   Easily annoyed or irritable 1 1 1 2   Afraid - awful might happen 0 1 0 0  Total GAD 7 Score 2 7 7 7   Anxiety Difficulty Not difficult at all Somewhat difficult Somewhat difficult Somewhat difficult        12/27/2023    2:00 PM 12/06/2023    1:47 PM 11/21/2023    1:51 PM  Depression screen PHQ 2/9  Decreased Interest 1 1 2   Down, Depressed, Hopeless 1 1 1   PHQ - 2 Score 2 2 3   Altered sleeping 1 1 2   Tired, decreased energy 3 2 3   Change in appetite 0 0 0  Feeling bad or failure about yourself  0 0 0  Trouble concentrating 1  1 1  Moving slowly or fidgety/restless 0 1 2  Suicidal thoughts 0 0 0  PHQ-9 Score 7 7 11   Difficult doing work/chores Not difficult at all Not difficult at all Somewhat difficult    BP Readings from Last 3 Encounters:  12/27/23 128/60  12/06/23 120/72  11/21/23 122/66    Physical Exam Vitals and nursing note reviewed.  Constitutional:      Appearance: She is well-developed.  Cardiovascular:     Rate and Rhythm: Normal rate and regular rhythm.     Heart sounds: Normal heart sounds.  Pulmonary:     Effort: Pulmonary effort is normal. No respiratory distress.     Breath sounds: Normal breath sounds.  Abdominal:     General: Bowel sounds are normal.     Palpations: Abdomen is soft.     Tenderness: There is abdominal tenderness in the suprapubic area. There is no right CVA tenderness, left CVA tenderness, guarding or rebound.  Skin:    General: Skin is warm and dry.  Neurological:     Mental Status: She is alert.    Lab Results  Component Value Date   COLORU LT yellow 12/27/2023   CLARITYU Clear 12/27/2023   GLUCOSEUR Positive (A) 12/27/2023   BILIRUBINUR negative 12/27/2023   KETONESU Negative 12/27/2023   SPECGRAV 1.010 12/27/2023   RBCUR negative 12/27/2023   PHUR 6.0 12/27/2023   PROTEINUR Negative 12/27/2023   UROBILINOGEN 0.2 12/27/2023   LEUKOCYTESUR Moderate (2+) (A) 12/27/2023     Wt Readings from Last 3 Encounters:  12/27/23 84 lb 12.8 oz (38.5 kg)  12/06/23 83 lb (37.6 kg)  11/21/23 83 lb (37.6 kg)    BP 128/60   Pulse (!) 103   Ht 5' 5 (1.651 m)   Wt 84 lb 12.8 oz (38.5 kg)   SpO2 90%   BMI 14.11 kg/m    Assessment and Plan:  Problem List Items Addressed This Visit   None Visit Diagnoses       Dysuria    -  Primary   recurrent symptoms continue fluids, cranberry treat with Cipro , send culture; diflucan  for possible yeast vaginitis contributing   Relevant Medications   ciprofloxacin  (CIPRO ) 250 MG tablet   fluconazole  (DIFLUCAN ) 100 MG tablet   Other Relevant Orders   POCT urinalysis dipstick (Completed)   Urine Culture       No follow-ups on file.    Leita HILARIO Adie, MD [facility] [place]

## 2023-12-27 NOTE — Telephone Encounter (Signed)
 FYI Only or Action Required?: Action required by provider: request for appointment.  Patient was last seen in primary care on 12/06/2023 by Justus Leita DEL, MD.  Called Nurse Triage reporting Dysuria.  Symptoms began several days ago.  Interventions attempted: Nothing.  Symptoms are: unchanged.  Triage Disposition: Call PCP Within 24 Hours  Patient/caregiver understands and will follow disposition?: Yes  Copied from CRM #[id number]. Topic: Clinical - Red Word Triage >> Dec 27, 2023  9:23 AM Kendra Mullins wrote: Red Word that prompted transfer to Nurse Triage: Recently in hospital at [facility] 12/21/23, due to dehydration.   Patient states she is having symptoms burning with urination, and frequency. Patient states her UTI,s are recurrent. Per patient states per previous labs in hospital, showed (BNP) B-type natriuretic peptide levels showed being elevated. Patient believes this is the reason for recurrent UTI'S.  Patient reports she has taken 2 rounds of antibiotics, but symtpoms are returning. Requesting some relief with medications, as per patient states provider has called in medication to help with symptoms in the past. Reason for Disposition  [1] Painful urination AND [2] EITHER frequency or urgency AND [3] has on-call doctor  Answer Assessment - Initial Assessment Questions Scheduled 12/27/23. Advised call back if symptoms worsen.  Patient reports been on 2 antibiotics, completed antibiotic Friday, symptoms cleared up and come back Monday; hospitalized 12/21/23 due to UTI.  Requesting CT Scan  Urology referral  1. SEVERITY: How bad is the pain?  (e.g., Scale 1-10; mild, moderate, or severe)     6/10; lower abdomen and lower back, below belly button, comes and goes,  2. FREQUENCY: How many times have you had painful urination today?      Vaginal pain when urinating 3. PATTERN: Is pain present every time you urinate or just sometimes?      Pain comes and goes in abdomen and back,  painful urination 4. ONSET: When did the painful urination start?      On and off for a month now, started back feeling bad Monday 5. FEVER: Do you have a fever? If Yes, ask: What is your temperature, how was it measured, and when did it start?     no 6. PAST UTI: Have you had a urine infection before? If Yes, ask: When was the last time? and What happened that time?      Finished antibiotic UTI 7. CAUSE: What do you think is causing the painful urination?  (e.g., UTI, scratch, Herpes sore)     UTI 8. OTHER SYMPTOMS: Do you have any other symptoms? (e.g., blood in urine, flank pain, genital sores, urgency, vaginal discharge)     Frequency, urgency, denies blood, discharge  Protocols used: Urination Pain - Female-A-AH

## 2023-12-27 NOTE — Telephone Encounter (Signed)
 Noted  Pt has appt.  KP

## 2023-12-28 DIAGNOSIS — I6522 Occlusion and stenosis of left carotid artery: Secondary | ICD-10-CM | POA: Diagnosis not present

## 2023-12-28 DIAGNOSIS — F1090 Alcohol use, unspecified, uncomplicated: Secondary | ICD-10-CM | POA: Diagnosis not present

## 2023-12-28 DIAGNOSIS — E785 Hyperlipidemia, unspecified: Secondary | ICD-10-CM | POA: Diagnosis not present

## 2023-12-28 DIAGNOSIS — I110 Hypertensive heart disease with heart failure: Secondary | ICD-10-CM | POA: Diagnosis not present

## 2023-12-28 DIAGNOSIS — I5022 Chronic systolic (congestive) heart failure: Secondary | ICD-10-CM | POA: Diagnosis not present

## 2023-12-28 DIAGNOSIS — F17210 Nicotine dependence, cigarettes, uncomplicated: Secondary | ICD-10-CM | POA: Diagnosis not present

## 2023-12-28 DIAGNOSIS — N390 Urinary tract infection, site not specified: Secondary | ICD-10-CM | POA: Diagnosis not present

## 2023-12-28 DIAGNOSIS — J449 Chronic obstructive pulmonary disease, unspecified: Secondary | ICD-10-CM | POA: Diagnosis not present

## 2023-12-30 LAB — URINE CULTURE

## 2023-12-31 ENCOUNTER — Ambulatory Visit: Payer: Self-pay | Admitting: Internal Medicine

## 2024-01-01 ENCOUNTER — Other Ambulatory Visit: Payer: Self-pay | Admitting: Internal Medicine

## 2024-01-01 DIAGNOSIS — R300 Dysuria: Secondary | ICD-10-CM

## 2024-01-01 DIAGNOSIS — J432 Centrilobular emphysema: Secondary | ICD-10-CM

## 2024-01-01 NOTE — Telephone Encounter (Signed)
 Please review and advise about referral and if patient should come in regards to other concerns.  JM

## 2024-01-01 NOTE — Progress Notes (Unsigned)
 Date:  01/01/2024   Name:  Kendra Mullins   DOB:  07-30-1960   MRN:  [id number]   Chief Complaint: No chief complaint on file.  HPI  Review of Systems   Lab Results  Component Value Date   NA 137 01/14/2022   K 4.1 01/14/2022   CO2 27 01/14/2022   GLUCOSE 91 01/14/2022   BUN 10 01/14/2022   CREATININE 0.62 01/14/2022   CALCIUM  9.5 01/14/2022   EGFR 101 01/14/2022   GFRNONAA 102 10/25/2019   Lab Results  Component Value Date   CHOL 166 01/14/2022   HDL 109 01/14/2022   LDLCALC 48 01/14/2022   TRIG 35 01/14/2022   CHOLHDL 1.5 01/14/2022   Lab Results  Component Value Date   TSH 1.490 01/14/2022   No results found for: HGBA1C Lab Results  Component Value Date   WBC 7.2 01/14/2022   HGB 12.8 01/14/2022   HCT 37.1 01/14/2022   MCV 93 01/14/2022   PLT 335 01/14/2022   Lab Results  Component Value Date   ALT 30 01/14/2022   AST 29 01/14/2022   ALKPHOS 125 (H) 01/14/2022   BILITOT 0.3 01/14/2022   Lab Results  Component Value Date   VD25OH 17.7 (L) 05/04/2021     Patient Active Problem List   Diagnosis Date Noted   Acute heart failure with reduced ejection fraction (HFrEF, <= 40%) (HCC) 03/03/2023   Atrial fibrillation with RVR (HCC) 07/26/2022   Syncope 07/26/2022   Cystitis 02/22/2022   S/P total hysterectomy 05/19/2020   NSCLC of left lung (HCC) 01/29/2020   Closed fracture of head of left humerus 05/15/2019   Aortic atherosclerosis (HCC) 12/31/2017   TIA (transient ischemic attack) 06/25/2017   Localized primary osteoarthritis of hands, bilateral 05/02/2017   Alcohol use disorder, severe, dependence (HCC) 09/15/2014   Anxiety and depression 09/15/2014   Tobacco use disorder 09/15/2014   Essential hypertension 09/15/2014   Hot flash, menopausal 09/15/2014   MI (mitral incompetence) 09/15/2014   Centrilobular emphysema (HCC) 09/15/2014   Low weight 09/15/2014    Allergies  Allergen Reactions   Azithromycin Nausea Only    Past  Surgical History:  Procedure Laterality Date   TOTAL ABDOMINAL HYSTERECTOMY  1986   one ovary remains    Social History   Tobacco Use   Smoking status: Every Day    Average packs/day: 0.5 packs/day for 40.0 years (20.0 ttl pk-yrs)    Types: Cigarettes    Start date: 1975   Smokeless tobacco: Never  Vaping Use   Vaping status: Every Day  Substance Use Topics   Alcohol use: Yes    Alcohol/week: 50.0 standard drinks of alcohol    Types: 50 Standard drinks or equivalent per week   Drug use: No     Medication list has been reviewed and updated.  No outpatient medications have been marked as taking for the 01/01/24 encounter (Orders Only) with Justus Leita DEL, MD.       12/27/2023    2:00 PM 12/06/2023    1:47 PM 11/21/2023    1:51 PM 03/23/2023    2:23 PM  GAD 7 : Generalized Anxiety Score  Nervous, Anxious, on Edge 1 1 2 1   Control/stop worrying 0 1 1 1   Worry too much - different things 0 1 1 1   Trouble relaxing 0 1 1 1   Restless 0 1 1 1   Easily annoyed or irritable 1 1 1 2   Afraid - awful might happen  0 1 0 0  Total GAD 7 Score 2 7 7 7   Anxiety Difficulty Not difficult at all Somewhat difficult Somewhat difficult Somewhat difficult       12/27/2023    2:00 PM 12/06/2023    1:47 PM 11/21/2023    1:51 PM  Depression screen PHQ 2/9  Decreased Interest 1 1 2   Down, Depressed, Hopeless 1 1 1   PHQ - 2 Score 2 2 3   Altered sleeping 1 1 2   Tired, decreased energy 3 2 3   Change in appetite 0 0 0  Feeling bad or failure about yourself  0 0 0  Trouble concentrating 1 1 1   Moving slowly or fidgety/restless 0 1 2  Suicidal thoughts 0 0 0  PHQ-9 Score 7 7 11   Difficult doing work/chores Not difficult at all Not difficult at all Somewhat difficult    BP Readings from Last 3 Encounters:  12/27/23 128/60  12/06/23 120/72  11/21/23 122/66    Physical Exam  Wt Readings from Last 3 Encounters:  12/27/23 84 lb 12.8 oz (38.5 kg)  12/06/23 83 lb (37.6 kg)  11/21/23 83  lb (37.6 kg)    There were no vitals taken for this visit.  Assessment and Plan:  Problem List Items Addressed This Visit   None   No follow-ups on file.    Leita HILARIO Adie, MD [facility] [place]

## 2024-01-02 NOTE — Telephone Encounter (Signed)
 Requested Prescriptions  Pending Prescriptions Disp Refills   SYMBICORT  80-4.5 MCG/ACT inhaler [Pharmacy Med Name: SYMBICORT  80-4.5 AEROSOL] 10.2 each 2    Sig: INHALE TWO (2) PUFFS BY MOUTH INTO LUNGS EVERY DAY     Pulmonology:  Combination Products Passed - 01/02/2024 12:01 PM      Passed - Valid encounter within last 12 months    Recent Outpatient Visits           6 days ago Dysuria    Primary Care & Sports Medicine at [facility], Leita DEL, MD   3 weeks ago Acute cystitis without hematuria   [facility] at [facility], Leita DEL, MD   1 month ago Acute otitis media, unspecified otitis media type   [facility] Medicine at [facility], Leita DEL, MD

## 2024-01-04 DIAGNOSIS — Z23 Encounter for immunization: Secondary | ICD-10-CM | POA: Diagnosis not present

## 2024-01-04 DIAGNOSIS — Z1211 Encounter for screening for malignant neoplasm of colon: Secondary | ICD-10-CM | POA: Diagnosis not present

## 2024-01-04 DIAGNOSIS — Z1331 Encounter for screening for depression: Secondary | ICD-10-CM | POA: Diagnosis not present

## 2024-01-04 DIAGNOSIS — R1032 Left lower quadrant pain: Secondary | ICD-10-CM | POA: Diagnosis not present

## 2024-01-04 DIAGNOSIS — R1031 Right lower quadrant pain: Secondary | ICD-10-CM | POA: Diagnosis not present

## 2024-01-04 DIAGNOSIS — Z1231 Encounter for screening mammogram for malignant neoplasm of breast: Secondary | ICD-10-CM | POA: Diagnosis not present

## 2024-01-04 DIAGNOSIS — Z1330 Encounter for screening examination for mental health and behavioral disorders, unspecified: Secondary | ICD-10-CM | POA: Diagnosis not present

## 2024-01-04 DIAGNOSIS — J449 Chronic obstructive pulmonary disease, unspecified: Secondary | ICD-10-CM | POA: Diagnosis not present

## 2024-01-04 DIAGNOSIS — Z131 Encounter for screening for diabetes mellitus: Secondary | ICD-10-CM | POA: Diagnosis not present

## 2024-01-04 DIAGNOSIS — I6522 Occlusion and stenosis of left carotid artery: Secondary | ICD-10-CM | POA: Diagnosis not present

## 2024-01-04 DIAGNOSIS — C3492 Malignant neoplasm of unspecified part of left bronchus or lung: Secondary | ICD-10-CM | POA: Diagnosis not present

## 2024-01-08 ENCOUNTER — Ambulatory Visit: Admitting: Urology

## 2024-01-08 ENCOUNTER — Other Ambulatory Visit: Payer: Self-pay

## 2024-01-08 DIAGNOSIS — R300 Dysuria: Secondary | ICD-10-CM

## 2024-01-09 ENCOUNTER — Other Ambulatory Visit
Admission: RE | Admit: 2024-01-09 | Discharge: 2024-01-09 | Disposition: A | Discharge status: Home / Self Care | Attending: Urology | Admitting: Urology

## 2024-01-09 ENCOUNTER — Ambulatory Visit (INDEPENDENT_AMBULATORY_CARE_PROVIDER_SITE_OTHER): Admitting: Urology

## 2024-01-09 VITALS — BP 127/74 | HR 107 | Wt 83.0 lb

## 2024-01-09 DIAGNOSIS — N390 Urinary tract infection, site not specified: Secondary | ICD-10-CM | POA: Diagnosis not present

## 2024-01-09 DIAGNOSIS — R300 Dysuria: Secondary | ICD-10-CM | POA: Insufficient documentation

## 2024-01-09 LAB — URINALYSIS, COMPLETE (UACMP) WITH MICROSCOPIC
Bilirubin Urine: NEGATIVE
Glucose, UA: 500 mg/dL — AB
Ketones, ur: NEGATIVE mg/dL
Nitrite: POSITIVE — AB
Protein, ur: NEGATIVE mg/dL
Specific Gravity, Urine: 1.01 (ref 1.005–1.030)
pH: 6 (ref 5.0–8.0)

## 2024-01-09 MED ORDER — SULFAMETHOXAZOLE-TRIMETHOPRIM 800-160 MG PO TABS: 1.0000 | ORAL_TABLET | Freq: Two times a day (BID) | ORAL | 0 refills | Status: AC

## 2024-01-09 NOTE — Progress Notes (Signed)
   01/09/2024 3:17 PM   Kendra Mullins 12/26/1960 [id number]  Reason for visit: Urinary symptoms, recurrent UTI  History: Originally seen November 2022 for UTI and recurrent infections, was offered topical estrogen cream but deferred and never followed up  Physical Exam: BP 127/74 (BP Location: Left Arm, Patient Position: Sitting, Cuff Size: Normal)   Pulse (!) 107   Wt 83 lb (37.6 kg)   SpO2 94%   BMI 13.81 kg/m   Imaging/labs: PET scan July 2024 with no evidence of stones or hydronephrosis Urinalysis today 0-5 squamous cells, 11-20 WBC, 0-5 RBC, few bacteria, WBC clumps present, small leukocytes, nitrite positive(took Azo) Prior culture data reviewed including E. coli and Klebsiella  Today: Currently reports 1 month of symptoms with dysuria, urgency, frequency, pelvic pain, no fevers or chills.  Treated with multiple courses of antibiotics including nitrofurantoin , Bactrim , and Cipro , duration unclear.  Antibiotics helped but symptoms recurred a few days later Symptoms exacerbated by alcohol  Plan:   Recurrent UTI: Recommended longer course of Bactrim  x 14 days, consider other etiologies like GSM if persistent symptoms.  Consider cystoscopy or CT if persistent symptoms despite negative culture   [name], MD  [facility] [address] [place], [place] [address] [phone]

## 2024-01-11 ENCOUNTER — Ambulatory Visit: Payer: Self-pay | Admitting: Urology

## 2024-01-11 ENCOUNTER — Ambulatory Visit: Admitting: Urology

## 2024-01-11 LAB — URINE CULTURE: Culture: 10000 — AB

## 2024-01-15 LAB — MISC LABCORP TEST (SEND OUT): Labcorp test code: 86884

## 2024-01-21 DIAGNOSIS — Z419 Encounter for procedure for purposes other than remedying health state, unspecified: Secondary | ICD-10-CM | POA: Diagnosis not present

## 2024-01-24 ENCOUNTER — Ambulatory Visit: Admitting: Internal Medicine

## 2024-01-24 ENCOUNTER — Telehealth: Payer: Self-pay

## 2024-01-24 DIAGNOSIS — Z7982 Long term (current) use of aspirin: Secondary | ICD-10-CM | POA: Diagnosis not present

## 2024-01-24 DIAGNOSIS — J439 Emphysema, unspecified: Secondary | ICD-10-CM | POA: Diagnosis not present

## 2024-01-24 DIAGNOSIS — R079 Chest pain, unspecified: Secondary | ICD-10-CM | POA: Diagnosis not present

## 2024-01-24 DIAGNOSIS — R059 Cough, unspecified: Secondary | ICD-10-CM | POA: Diagnosis not present

## 2024-01-24 DIAGNOSIS — Z79899 Other long term (current) drug therapy: Secondary | ICD-10-CM | POA: Diagnosis not present

## 2024-01-24 DIAGNOSIS — R52 Pain, unspecified: Secondary | ICD-10-CM | POA: Diagnosis not present

## 2024-01-24 DIAGNOSIS — I5042 Chronic combined systolic (congestive) and diastolic (congestive) heart failure: Secondary | ICD-10-CM | POA: Diagnosis not present

## 2024-01-24 DIAGNOSIS — R0602 Shortness of breath: Secondary | ICD-10-CM | POA: Diagnosis not present

## 2024-01-24 DIAGNOSIS — E871 Hypo-osmolality and hyponatremia: Secondary | ICD-10-CM | POA: Diagnosis not present

## 2024-01-24 DIAGNOSIS — J449 Chronic obstructive pulmonary disease, unspecified: Secondary | ICD-10-CM | POA: Diagnosis not present

## 2024-01-24 DIAGNOSIS — I5020 Unspecified systolic (congestive) heart failure: Secondary | ICD-10-CM | POA: Diagnosis not present

## 2024-01-24 DIAGNOSIS — C3432 Malignant neoplasm of lower lobe, left bronchus or lung: Secondary | ICD-10-CM | POA: Diagnosis not present

## 2024-01-24 DIAGNOSIS — F17210 Nicotine dependence, cigarettes, uncomplicated: Secondary | ICD-10-CM | POA: Diagnosis not present

## 2024-01-24 DIAGNOSIS — I110 Hypertensive heart disease with heart failure: Secondary | ICD-10-CM | POA: Diagnosis not present

## 2024-01-24 DIAGNOSIS — Z8673 Personal history of transient ischemic attack (TIA), and cerebral infarction without residual deficits: Secondary | ICD-10-CM | POA: Diagnosis not present

## 2024-01-24 DIAGNOSIS — I4891 Unspecified atrial fibrillation: Secondary | ICD-10-CM | POA: Diagnosis not present

## 2024-01-24 DIAGNOSIS — Z043 Encounter for examination and observation following other accident: Secondary | ICD-10-CM | POA: Diagnosis not present

## 2024-01-24 NOTE — Telephone Encounter (Signed)
 Called patient and left VM, sent my chart message, and text message letting patient know we will not be able to see her today for her scheduled 2 PM appt.  Patient is now a patient at [facility] in [place] and she will need to call them and be seen for her acute visit today per Dr Justus.  - Sheffield Hawker M.

## 2024-01-25 DIAGNOSIS — R0600 Dyspnea, unspecified: Secondary | ICD-10-CM | POA: Diagnosis not present

## 2024-01-25 DIAGNOSIS — R9431 Abnormal electrocardiogram [ECG] [EKG]: Secondary | ICD-10-CM | POA: Diagnosis not present

## 2024-02-05 ENCOUNTER — Ambulatory Visit: Admitting: Urology

## 2024-02-14 DIAGNOSIS — F331 Major depressive disorder, recurrent, moderate: Secondary | ICD-10-CM | POA: Diagnosis not present

## 2024-02-14 DIAGNOSIS — F410 Panic disorder [episodic paroxysmal anxiety] without agoraphobia: Secondary | ICD-10-CM | POA: Diagnosis not present

## 2024-02-20 DIAGNOSIS — Z7901 Long term (current) use of anticoagulants: Secondary | ICD-10-CM | POA: Diagnosis not present

## 2024-02-20 DIAGNOSIS — J439 Emphysema, unspecified: Secondary | ICD-10-CM | POA: Diagnosis not present

## 2024-02-20 DIAGNOSIS — E43 Unspecified severe protein-calorie malnutrition: Secondary | ICD-10-CM | POA: Diagnosis not present

## 2024-02-20 DIAGNOSIS — R109 Unspecified abdominal pain: Secondary | ICD-10-CM | POA: Diagnosis not present

## 2024-02-20 DIAGNOSIS — I110 Hypertensive heart disease with heart failure: Secondary | ICD-10-CM | POA: Diagnosis not present

## 2024-02-20 DIAGNOSIS — K8020 Calculus of gallbladder without cholecystitis without obstruction: Secondary | ICD-10-CM | POA: Diagnosis not present

## 2024-02-20 DIAGNOSIS — Z743 Need for continuous supervision: Secondary | ICD-10-CM | POA: Diagnosis not present

## 2024-02-20 DIAGNOSIS — K5669 Other partial intestinal obstruction: Secondary | ICD-10-CM | POA: Diagnosis not present

## 2024-02-20 DIAGNOSIS — Z4682 Encounter for fitting and adjustment of non-vascular catheter: Secondary | ICD-10-CM | POA: Diagnosis not present

## 2024-02-20 DIAGNOSIS — K56609 Unspecified intestinal obstruction, unspecified as to partial versus complete obstruction: Secondary | ICD-10-CM | POA: Diagnosis not present

## 2024-02-20 DIAGNOSIS — F1010 Alcohol abuse, uncomplicated: Secondary | ICD-10-CM | POA: Diagnosis not present

## 2024-02-20 DIAGNOSIS — Z681 Body mass index (BMI) 19 or less, adult: Secondary | ICD-10-CM | POA: Diagnosis not present

## 2024-02-20 DIAGNOSIS — J95821 Acute postprocedural respiratory failure: Secondary | ICD-10-CM | POA: Diagnosis not present

## 2024-02-20 DIAGNOSIS — K56699 Other intestinal obstruction unspecified as to partial versus complete obstruction: Secondary | ICD-10-CM | POA: Diagnosis not present

## 2024-02-20 DIAGNOSIS — J449 Chronic obstructive pulmonary disease, unspecified: Secondary | ICD-10-CM | POA: Diagnosis not present

## 2024-02-20 DIAGNOSIS — K6389 Other specified diseases of intestine: Secondary | ICD-10-CM | POA: Diagnosis not present

## 2024-02-20 DIAGNOSIS — I10 Essential (primary) hypertension: Secondary | ICD-10-CM | POA: Diagnosis not present

## 2024-02-20 DIAGNOSIS — R64 Cachexia: Secondary | ICD-10-CM | POA: Diagnosis not present

## 2024-02-20 DIAGNOSIS — L89151 Pressure ulcer of sacral region, stage 1: Secondary | ICD-10-CM | POA: Diagnosis not present

## 2024-02-20 DIAGNOSIS — K56600 Partial intestinal obstruction, unspecified as to cause: Secondary | ICD-10-CM | POA: Diagnosis not present

## 2024-02-20 DIAGNOSIS — I5023 Acute on chronic systolic (congestive) heart failure: Secondary | ICD-10-CM | POA: Diagnosis not present

## 2024-02-20 DIAGNOSIS — F419 Anxiety disorder, unspecified: Secondary | ICD-10-CM | POA: Diagnosis not present

## 2024-02-20 DIAGNOSIS — C3412 Malignant neoplasm of upper lobe, left bronchus or lung: Secondary | ICD-10-CM | POA: Diagnosis not present

## 2024-02-20 DIAGNOSIS — J189 Pneumonia, unspecified organism: Secondary | ICD-10-CM | POA: Diagnosis not present

## 2024-02-20 DIAGNOSIS — I429 Cardiomyopathy, unspecified: Secondary | ICD-10-CM | POA: Diagnosis not present

## 2024-02-21 DIAGNOSIS — K3189 Other diseases of stomach and duodenum: Secondary | ICD-10-CM | POA: Diagnosis not present

## 2024-02-21 DIAGNOSIS — J449 Chronic obstructive pulmonary disease, unspecified: Secondary | ICD-10-CM | POA: Diagnosis not present

## 2024-02-21 DIAGNOSIS — F1090 Alcohol use, unspecified, uncomplicated: Secondary | ICD-10-CM | POA: Diagnosis not present

## 2024-02-21 DIAGNOSIS — E46 Unspecified protein-calorie malnutrition: Secondary | ICD-10-CM | POA: Diagnosis not present

## 2024-02-21 DIAGNOSIS — R140 Abdominal distension (gaseous): Secondary | ICD-10-CM | POA: Diagnosis not present

## 2024-02-21 DIAGNOSIS — Z4682 Encounter for fitting and adjustment of non-vascular catheter: Secondary | ICD-10-CM | POA: Diagnosis not present

## 2024-02-22 DIAGNOSIS — R140 Abdominal distension (gaseous): Secondary | ICD-10-CM | POA: Diagnosis not present

## 2024-02-22 DIAGNOSIS — K3189 Other diseases of stomach and duodenum: Secondary | ICD-10-CM | POA: Diagnosis not present

## 2024-02-22 DIAGNOSIS — F1090 Alcohol use, unspecified, uncomplicated: Secondary | ICD-10-CM | POA: Diagnosis not present

## 2024-02-22 DIAGNOSIS — E46 Unspecified protein-calorie malnutrition: Secondary | ICD-10-CM | POA: Diagnosis not present

## 2024-02-22 DIAGNOSIS — K56600 Partial intestinal obstruction, unspecified as to cause: Secondary | ICD-10-CM | POA: Diagnosis not present

## 2024-02-22 DIAGNOSIS — J449 Chronic obstructive pulmonary disease, unspecified: Secondary | ICD-10-CM | POA: Diagnosis not present

## 2024-02-22 DIAGNOSIS — Z4682 Encounter for fitting and adjustment of non-vascular catheter: Secondary | ICD-10-CM | POA: Diagnosis not present

## 2024-02-23 DIAGNOSIS — K56600 Partial intestinal obstruction, unspecified as to cause: Secondary | ICD-10-CM | POA: Diagnosis not present

## 2024-02-23 DIAGNOSIS — E46 Unspecified protein-calorie malnutrition: Secondary | ICD-10-CM | POA: Diagnosis not present

## 2024-02-23 DIAGNOSIS — J449 Chronic obstructive pulmonary disease, unspecified: Secondary | ICD-10-CM | POA: Diagnosis not present

## 2024-02-23 DIAGNOSIS — I10 Essential (primary) hypertension: Secondary | ICD-10-CM | POA: Diagnosis not present

## 2024-02-23 DIAGNOSIS — K660 Peritoneal adhesions (postprocedural) (postinfection): Secondary | ICD-10-CM | POA: Diagnosis not present

## 2024-02-23 DIAGNOSIS — K56699 Other intestinal obstruction unspecified as to partial versus complete obstruction: Secondary | ICD-10-CM | POA: Diagnosis not present

## 2024-02-23 DIAGNOSIS — F1090 Alcohol use, unspecified, uncomplicated: Secondary | ICD-10-CM | POA: Diagnosis not present

## 2024-02-24 DIAGNOSIS — E43 Unspecified severe protein-calorie malnutrition: Secondary | ICD-10-CM | POA: Diagnosis not present

## 2024-02-24 DIAGNOSIS — J449 Chronic obstructive pulmonary disease, unspecified: Secondary | ICD-10-CM | POA: Diagnosis not present

## 2024-02-24 DIAGNOSIS — F1090 Alcohol use, unspecified, uncomplicated: Secondary | ICD-10-CM | POA: Diagnosis not present

## 2024-02-25 DIAGNOSIS — J449 Chronic obstructive pulmonary disease, unspecified: Secondary | ICD-10-CM | POA: Diagnosis not present

## 2024-02-25 DIAGNOSIS — E876 Hypokalemia: Secondary | ICD-10-CM | POA: Diagnosis not present

## 2024-02-26 DIAGNOSIS — K56609 Unspecified intestinal obstruction, unspecified as to partial versus complete obstruction: Secondary | ICD-10-CM | POA: Diagnosis not present

## 2024-02-26 DIAGNOSIS — E876 Hypokalemia: Secondary | ICD-10-CM | POA: Diagnosis not present

## 2024-02-26 DIAGNOSIS — J90 Pleural effusion, not elsewhere classified: Secondary | ICD-10-CM | POA: Diagnosis not present

## 2024-02-26 DIAGNOSIS — Z452 Encounter for adjustment and management of vascular access device: Secondary | ICD-10-CM | POA: Diagnosis not present

## 2024-02-26 DIAGNOSIS — E43 Unspecified severe protein-calorie malnutrition: Secondary | ICD-10-CM | POA: Diagnosis not present

## 2024-02-26 DIAGNOSIS — K5669 Other partial intestinal obstruction: Secondary | ICD-10-CM | POA: Diagnosis not present

## 2024-02-26 DIAGNOSIS — J4489 Other specified chronic obstructive pulmonary disease: Secondary | ICD-10-CM | POA: Diagnosis not present

## 2024-02-27 DIAGNOSIS — E43 Unspecified severe protein-calorie malnutrition: Secondary | ICD-10-CM | POA: Diagnosis not present

## 2024-02-27 DIAGNOSIS — Z9981 Dependence on supplemental oxygen: Secondary | ICD-10-CM | POA: Diagnosis not present

## 2024-02-27 DIAGNOSIS — E876 Hypokalemia: Secondary | ICD-10-CM | POA: Diagnosis not present

## 2024-02-27 DIAGNOSIS — J4489 Other specified chronic obstructive pulmonary disease: Secondary | ICD-10-CM | POA: Diagnosis not present

## 2024-02-27 DIAGNOSIS — J810 Acute pulmonary edema: Secondary | ICD-10-CM | POA: Diagnosis not present

## 2024-02-27 DIAGNOSIS — K5669 Other partial intestinal obstruction: Secondary | ICD-10-CM | POA: Diagnosis not present

## 2024-02-28 DIAGNOSIS — Z9981 Dependence on supplemental oxygen: Secondary | ICD-10-CM | POA: Diagnosis not present

## 2024-02-28 DIAGNOSIS — R0682 Tachypnea, not elsewhere classified: Secondary | ICD-10-CM | POA: Diagnosis not present

## 2024-02-28 DIAGNOSIS — J810 Acute pulmonary edema: Secondary | ICD-10-CM | POA: Diagnosis not present

## 2024-02-28 DIAGNOSIS — J90 Pleural effusion, not elsewhere classified: Secondary | ICD-10-CM | POA: Diagnosis not present

## 2024-02-28 DIAGNOSIS — R918 Other nonspecific abnormal finding of lung field: Secondary | ICD-10-CM | POA: Diagnosis not present

## 2024-02-28 DIAGNOSIS — R000 Tachycardia, unspecified: Secondary | ICD-10-CM | POA: Diagnosis not present

## 2024-02-28 DIAGNOSIS — E43 Unspecified severe protein-calorie malnutrition: Secondary | ICD-10-CM | POA: Diagnosis not present

## 2024-02-28 DIAGNOSIS — R0902 Hypoxemia: Secondary | ICD-10-CM | POA: Diagnosis not present

## 2024-02-29 DIAGNOSIS — D72829 Elevated white blood cell count, unspecified: Secondary | ICD-10-CM | POA: Diagnosis not present

## 2024-02-29 DIAGNOSIS — R918 Other nonspecific abnormal finding of lung field: Secondary | ICD-10-CM | POA: Diagnosis not present

## 2024-02-29 DIAGNOSIS — I5022 Chronic systolic (congestive) heart failure: Secondary | ICD-10-CM | POA: Diagnosis not present

## 2024-02-29 DIAGNOSIS — I340 Nonrheumatic mitral (valve) insufficiency: Secondary | ICD-10-CM | POA: Diagnosis not present

## 2024-02-29 DIAGNOSIS — I517 Cardiomegaly: Secondary | ICD-10-CM | POA: Diagnosis not present

## 2024-02-29 DIAGNOSIS — J9601 Acute respiratory failure with hypoxia: Secondary | ICD-10-CM | POA: Diagnosis not present

## 2024-02-29 DIAGNOSIS — E43 Unspecified severe protein-calorie malnutrition: Secondary | ICD-10-CM | POA: Diagnosis not present

## 2024-02-29 DIAGNOSIS — R911 Solitary pulmonary nodule: Secondary | ICD-10-CM | POA: Diagnosis not present

## 2024-02-29 DIAGNOSIS — R188 Other ascites: Secondary | ICD-10-CM | POA: Diagnosis not present

## 2024-02-29 DIAGNOSIS — K56609 Unspecified intestinal obstruction, unspecified as to partial versus complete obstruction: Secondary | ICD-10-CM | POA: Diagnosis not present

## 2024-02-29 DIAGNOSIS — I4891 Unspecified atrial fibrillation: Secondary | ICD-10-CM | POA: Diagnosis not present

## 2024-02-29 DIAGNOSIS — I447 Left bundle-branch block, unspecified: Secondary | ICD-10-CM | POA: Diagnosis not present

## 2024-02-29 DIAGNOSIS — R0989 Other specified symptoms and signs involving the circulatory and respiratory systems: Secondary | ICD-10-CM | POA: Diagnosis not present

## 2024-03-01 DIAGNOSIS — R64 Cachexia: Secondary | ICD-10-CM | POA: Diagnosis not present

## 2024-03-01 DIAGNOSIS — Z4682 Encounter for fitting and adjustment of non-vascular catheter: Secondary | ICD-10-CM | POA: Diagnosis not present

## 2024-03-01 DIAGNOSIS — Z9911 Dependence on respirator [ventilator] status: Secondary | ICD-10-CM | POA: Diagnosis not present

## 2024-03-01 DIAGNOSIS — I429 Cardiomyopathy, unspecified: Secondary | ICD-10-CM | POA: Diagnosis not present

## 2024-03-01 DIAGNOSIS — J449 Chronic obstructive pulmonary disease, unspecified: Secondary | ICD-10-CM | POA: Diagnosis not present

## 2024-03-01 DIAGNOSIS — I509 Heart failure, unspecified: Secondary | ICD-10-CM | POA: Diagnosis not present

## 2024-03-01 DIAGNOSIS — R188 Other ascites: Secondary | ICD-10-CM | POA: Diagnosis not present

## 2024-03-01 DIAGNOSIS — J439 Emphysema, unspecified: Secondary | ICD-10-CM | POA: Diagnosis not present

## 2024-03-01 DIAGNOSIS — Z452 Encounter for adjustment and management of vascular access device: Secondary | ICD-10-CM | POA: Diagnosis not present

## 2024-03-01 DIAGNOSIS — R000 Tachycardia, unspecified: Secondary | ICD-10-CM | POA: Diagnosis not present

## 2024-03-01 DIAGNOSIS — J90 Pleural effusion, not elsewhere classified: Secondary | ICD-10-CM | POA: Diagnosis not present

## 2024-03-01 DIAGNOSIS — K659 Peritonitis, unspecified: Secondary | ICD-10-CM | POA: Diagnosis not present

## 2024-03-01 DIAGNOSIS — I4891 Unspecified atrial fibrillation: Secondary | ICD-10-CM | POA: Diagnosis not present

## 2024-03-03 DIAGNOSIS — I10 Essential (primary) hypertension: Secondary | ICD-10-CM | POA: Diagnosis not present

## 2024-03-03 DIAGNOSIS — I5021 Acute systolic (congestive) heart failure: Secondary | ICD-10-CM | POA: Diagnosis not present

## 2024-03-03 DIAGNOSIS — J90 Pleural effusion, not elsewhere classified: Secondary | ICD-10-CM | POA: Diagnosis not present

## 2024-03-03 DIAGNOSIS — K56699 Other intestinal obstruction unspecified as to partial versus complete obstruction: Secondary | ICD-10-CM | POA: Diagnosis not present

## 2024-03-03 DIAGNOSIS — C3432 Malignant neoplasm of lower lobe, left bronchus or lung: Secondary | ICD-10-CM | POA: Diagnosis not present

## 2024-03-03 DIAGNOSIS — I509 Heart failure, unspecified: Secondary | ICD-10-CM | POA: Diagnosis not present

## 2024-03-04 DIAGNOSIS — J810 Acute pulmonary edema: Secondary | ICD-10-CM | POA: Diagnosis not present

## 2024-03-04 DIAGNOSIS — I4891 Unspecified atrial fibrillation: Secondary | ICD-10-CM | POA: Diagnosis not present

## 2024-03-04 DIAGNOSIS — I340 Nonrheumatic mitral (valve) insufficiency: Secondary | ICD-10-CM | POA: Diagnosis not present

## 2024-03-04 DIAGNOSIS — J90 Pleural effusion, not elsewhere classified: Secondary | ICD-10-CM | POA: Diagnosis not present

## 2024-03-04 DIAGNOSIS — I5021 Acute systolic (congestive) heart failure: Secondary | ICD-10-CM | POA: Diagnosis not present

## 2024-03-04 DIAGNOSIS — J9601 Acute respiratory failure with hypoxia: Secondary | ICD-10-CM | POA: Diagnosis not present

## 2024-03-04 DIAGNOSIS — I5023 Acute on chronic systolic (congestive) heart failure: Secondary | ICD-10-CM | POA: Diagnosis not present

## 2024-03-05 DIAGNOSIS — K56609 Unspecified intestinal obstruction, unspecified as to partial versus complete obstruction: Secondary | ICD-10-CM | POA: Diagnosis not present

## 2024-03-05 DIAGNOSIS — J811 Chronic pulmonary edema: Secondary | ICD-10-CM | POA: Diagnosis not present

## 2024-03-05 DIAGNOSIS — R0600 Dyspnea, unspecified: Secondary | ICD-10-CM | POA: Diagnosis not present

## 2024-03-05 DIAGNOSIS — J90 Pleural effusion, not elsewhere classified: Secondary | ICD-10-CM | POA: Diagnosis not present

## 2024-03-05 DIAGNOSIS — Z9981 Dependence on supplemental oxygen: Secondary | ICD-10-CM | POA: Diagnosis not present

## 2024-03-06 DIAGNOSIS — K56609 Unspecified intestinal obstruction, unspecified as to partial versus complete obstruction: Secondary | ICD-10-CM | POA: Diagnosis not present

## 2024-03-06 DIAGNOSIS — I4891 Unspecified atrial fibrillation: Secondary | ICD-10-CM | POA: Diagnosis not present

## 2024-03-06 DIAGNOSIS — Z9981 Dependence on supplemental oxygen: Secondary | ICD-10-CM | POA: Diagnosis not present

## 2024-03-06 DIAGNOSIS — J90 Pleural effusion, not elsewhere classified: Secondary | ICD-10-CM | POA: Diagnosis not present

## 2024-03-06 DIAGNOSIS — I340 Nonrheumatic mitral (valve) insufficiency: Secondary | ICD-10-CM | POA: Diagnosis not present

## 2024-03-07 DIAGNOSIS — R000 Tachycardia, unspecified: Secondary | ICD-10-CM | POA: Diagnosis not present

## 2024-03-07 DIAGNOSIS — Z9981 Dependence on supplemental oxygen: Secondary | ICD-10-CM | POA: Diagnosis not present

## 2024-03-07 DIAGNOSIS — J439 Emphysema, unspecified: Secondary | ICD-10-CM | POA: Diagnosis not present

## 2024-03-07 DIAGNOSIS — J90 Pleural effusion, not elsewhere classified: Secondary | ICD-10-CM | POA: Diagnosis not present

## 2024-03-07 DIAGNOSIS — K56609 Unspecified intestinal obstruction, unspecified as to partial versus complete obstruction: Secondary | ICD-10-CM | POA: Diagnosis not present

## 2024-03-08 DIAGNOSIS — R0600 Dyspnea, unspecified: Secondary | ICD-10-CM | POA: Diagnosis not present

## 2024-03-08 DIAGNOSIS — K56609 Unspecified intestinal obstruction, unspecified as to partial versus complete obstruction: Secondary | ICD-10-CM | POA: Diagnosis not present

## 2024-03-08 DIAGNOSIS — Z9981 Dependence on supplemental oxygen: Secondary | ICD-10-CM | POA: Diagnosis not present

## 2024-03-09 DIAGNOSIS — D72829 Elevated white blood cell count, unspecified: Secondary | ICD-10-CM | POA: Diagnosis not present

## 2024-03-09 DIAGNOSIS — K56609 Unspecified intestinal obstruction, unspecified as to partial versus complete obstruction: Secondary | ICD-10-CM | POA: Diagnosis not present

## 2024-03-09 DIAGNOSIS — Z9981 Dependence on supplemental oxygen: Secondary | ICD-10-CM | POA: Diagnosis not present

## 2024-03-10 DIAGNOSIS — K56609 Unspecified intestinal obstruction, unspecified as to partial versus complete obstruction: Secondary | ICD-10-CM | POA: Diagnosis not present

## 2024-03-12 DIAGNOSIS — I080 Rheumatic disorders of both mitral and aortic valves: Secondary | ICD-10-CM | POA: Diagnosis not present

## 2024-03-12 DIAGNOSIS — I4891 Unspecified atrial fibrillation: Secondary | ICD-10-CM | POA: Diagnosis not present

## 2024-03-12 DIAGNOSIS — I5020 Unspecified systolic (congestive) heart failure: Secondary | ICD-10-CM | POA: Diagnosis not present

## 2024-03-12 DIAGNOSIS — J449 Chronic obstructive pulmonary disease, unspecified: Secondary | ICD-10-CM | POA: Diagnosis not present

## 2024-03-12 DIAGNOSIS — R0609 Other forms of dyspnea: Secondary | ICD-10-CM | POA: Diagnosis not present

## 2024-03-12 DIAGNOSIS — I340 Nonrheumatic mitral (valve) insufficiency: Secondary | ICD-10-CM | POA: Diagnosis not present

## 2024-03-12 DIAGNOSIS — K219 Gastro-esophageal reflux disease without esophagitis: Secondary | ICD-10-CM | POA: Diagnosis not present

## 2024-03-12 DIAGNOSIS — I428 Other cardiomyopathies: Secondary | ICD-10-CM | POA: Diagnosis not present

## 2024-03-12 DIAGNOSIS — I5023 Acute on chronic systolic (congestive) heart failure: Secondary | ICD-10-CM | POA: Diagnosis not present

## 2024-03-12 DIAGNOSIS — I447 Left bundle-branch block, unspecified: Secondary | ICD-10-CM | POA: Diagnosis not present

## 2024-03-25 DIAGNOSIS — K5900 Constipation, unspecified: Secondary | ICD-10-CM | POA: Diagnosis not present

## 2024-03-25 DIAGNOSIS — Z8719 Personal history of other diseases of the digestive system: Secondary | ICD-10-CM | POA: Diagnosis not present

## 2024-03-25 DIAGNOSIS — F1011 Alcohol abuse, in remission: Secondary | ICD-10-CM | POA: Diagnosis not present

## 2024-03-25 DIAGNOSIS — Z8673 Personal history of transient ischemic attack (TIA), and cerebral infarction without residual deficits: Secondary | ICD-10-CM | POA: Diagnosis not present

## 2024-03-25 DIAGNOSIS — I4891 Unspecified atrial fibrillation: Secondary | ICD-10-CM | POA: Diagnosis not present

## 2024-03-25 DIAGNOSIS — I6522 Occlusion and stenosis of left carotid artery: Secondary | ICD-10-CM | POA: Diagnosis not present

## 2024-03-25 DIAGNOSIS — I771 Stricture of artery: Secondary | ICD-10-CM | POA: Diagnosis not present

## 2024-03-25 DIAGNOSIS — F17201 Nicotine dependence, unspecified, in remission: Secondary | ICD-10-CM | POA: Diagnosis not present

## 2024-03-25 DIAGNOSIS — F32A Depression, unspecified: Secondary | ICD-10-CM | POA: Diagnosis not present

## 2024-03-25 DIAGNOSIS — I5020 Unspecified systolic (congestive) heart failure: Secondary | ICD-10-CM | POA: Diagnosis not present

## 2024-03-25 DIAGNOSIS — J449 Chronic obstructive pulmonary disease, unspecified: Secondary | ICD-10-CM | POA: Diagnosis not present

## 2024-03-25 DIAGNOSIS — L89152 Pressure ulcer of sacral region, stage 2: Secondary | ICD-10-CM | POA: Diagnosis not present

## 2024-03-25 DIAGNOSIS — F419 Anxiety disorder, unspecified: Secondary | ICD-10-CM | POA: Diagnosis not present

## 2024-03-25 DIAGNOSIS — Z13228 Encounter for screening for other metabolic disorders: Secondary | ICD-10-CM | POA: Diagnosis not present

## 2024-03-25 DIAGNOSIS — I700 Atherosclerosis of aorta: Secondary | ICD-10-CM | POA: Diagnosis not present

## 2024-03-29 DIAGNOSIS — I340 Nonrheumatic mitral (valve) insufficiency: Secondary | ICD-10-CM | POA: Diagnosis not present

## 2024-03-29 DIAGNOSIS — I739 Peripheral vascular disease, unspecified: Secondary | ICD-10-CM | POA: Diagnosis not present

## 2024-03-29 DIAGNOSIS — I4891 Unspecified atrial fibrillation: Secondary | ICD-10-CM | POA: Diagnosis not present

## 2024-03-29 DIAGNOSIS — I5020 Unspecified systolic (congestive) heart failure: Secondary | ICD-10-CM | POA: Diagnosis not present

## 2024-03-29 DIAGNOSIS — I428 Other cardiomyopathies: Secondary | ICD-10-CM | POA: Diagnosis not present

## 2024-03-29 DIAGNOSIS — I110 Hypertensive heart disease with heart failure: Secondary | ICD-10-CM | POA: Diagnosis not present

## 2024-03-31 DIAGNOSIS — J90 Pleural effusion, not elsewhere classified: Secondary | ICD-10-CM | POA: Diagnosis not present

## 2024-03-31 DIAGNOSIS — I10 Essential (primary) hypertension: Secondary | ICD-10-CM | POA: Diagnosis not present

## 2024-03-31 DIAGNOSIS — R918 Other nonspecific abnormal finding of lung field: Secondary | ICD-10-CM | POA: Diagnosis not present

## 2024-03-31 DIAGNOSIS — J441 Chronic obstructive pulmonary disease with (acute) exacerbation: Secondary | ICD-10-CM | POA: Diagnosis not present

## 2024-04-05 ENCOUNTER — Telehealth: Payer: Self-pay

## 2024-04-05 NOTE — Transitions of Care (Post Inpatient/ED Visit) (Signed)
" ° °  04/05/2024  Name: Kendra Mullins MRN: [id number] DOB: 09-30-1960  Today's TOC FU Call Status: Today's TOC FU Call Status:: Successful TOC FU Call Completed TOC FU Call Complete Date: 04/05/24 [facility] briefly with patient who states she was at [facility] and states [facility] is coming to see her and she does not feel she needs the [facility] program - Patient declined)  Patient's Name and Date of Birth confirmed. DOB, Name  Transition Care Management Follow-up Telephone Call Date of Discharge: 04/03/24 Discharge Facility: Other ([facility]) Name of Other ([facility]) Discharge Facility: [facility] Type of Discharge: Inpatient Admission Primary Inpatient Discharge Diagnosis:: Chronic obstructive pulmonary disease with acute exacerbation   Shona Prow RN, CCM [place]  VBCI-Population Health RN Care Manager [phone]  "
